# Patient Record
Sex: Male | Born: 1950
Health system: Southern US, Community
[De-identification: ages and names within clinical notes are randomized; demographics above are authoritative.]

## PROBLEM LIST (undated history)

## (undated) DIAGNOSIS — N189 Chronic kidney disease, unspecified: Secondary | ICD-10-CM

## (undated) DIAGNOSIS — N185 Chronic kidney disease, stage 5: Secondary | ICD-10-CM

## (undated) DIAGNOSIS — M549 Dorsalgia, unspecified: Secondary | ICD-10-CM

## (undated) DIAGNOSIS — J449 Chronic obstructive pulmonary disease, unspecified: Secondary | ICD-10-CM

## (undated) DIAGNOSIS — I739 Peripheral vascular disease, unspecified: Secondary | ICD-10-CM

## (undated) DIAGNOSIS — Z87442 Personal history of urinary calculi: Secondary | ICD-10-CM

## (undated) DIAGNOSIS — K219 Gastro-esophageal reflux disease without esophagitis: Secondary | ICD-10-CM

## (undated) DIAGNOSIS — E119 Type 2 diabetes mellitus without complications: Secondary | ICD-10-CM

## (undated) DIAGNOSIS — M1990 Unspecified osteoarthritis, unspecified site: Secondary | ICD-10-CM

## (undated) DIAGNOSIS — K5792 Diverticulitis of intestine, part unspecified, without perforation or abscess without bleeding: Secondary | ICD-10-CM

## (undated) DIAGNOSIS — R0600 Dyspnea, unspecified: Secondary | ICD-10-CM

## (undated) DIAGNOSIS — E611 Iron deficiency: Secondary | ICD-10-CM

## (undated) DIAGNOSIS — G8929 Other chronic pain: Secondary | ICD-10-CM

## (undated) DIAGNOSIS — N179 Acute kidney failure, unspecified: Secondary | ICD-10-CM

## (undated) DIAGNOSIS — Z9289 Personal history of other medical treatment: Secondary | ICD-10-CM

## (undated) HISTORY — PX: OTHER SURGICAL HISTORY: SHX169

## (undated) HISTORY — PX: KNEE ARTHROSCOPY: SUR90

## (undated) HISTORY — DX: Acute kidney failure, unspecified: N17.9

## (undated) HISTORY — DX: Chronic kidney disease, unspecified: N18.9

## (undated) HISTORY — DX: Chronic kidney disease, stage 5: N18.5

## (undated) HISTORY — PX: TONSILLECTOMY: SUR1361

## (undated) HISTORY — DX: Type 2 diabetes mellitus without complications: E11.9

---

## 2000-01-15 ENCOUNTER — Emergency Department (HOSPITAL_COMMUNITY): Admission: EM | Admit: 2000-01-15 | Discharge: 2000-01-15 | Payer: Self-pay | Admitting: Emergency Medicine

## 2003-03-14 ENCOUNTER — Ambulatory Visit (HOSPITAL_COMMUNITY): Admission: RE | Admit: 2003-03-14 | Discharge: 2003-03-14 | Payer: Self-pay | Admitting: Gastroenterology

## 2003-03-14 ENCOUNTER — Encounter (INDEPENDENT_AMBULATORY_CARE_PROVIDER_SITE_OTHER): Payer: Self-pay | Admitting: Specialist

## 2007-03-19 ENCOUNTER — Encounter: Admission: RE | Admit: 2007-03-19 | Discharge: 2007-03-19 | Payer: Self-pay | Admitting: Occupational Medicine

## 2007-07-02 ENCOUNTER — Encounter: Admission: RE | Admit: 2007-07-02 | Discharge: 2007-07-02 | Payer: Self-pay | Admitting: Occupational Medicine

## 2007-10-30 ENCOUNTER — Encounter: Admission: RE | Admit: 2007-10-30 | Discharge: 2007-10-30 | Payer: Self-pay | Admitting: Internal Medicine

## 2009-10-02 ENCOUNTER — Encounter: Admission: RE | Admit: 2009-10-02 | Discharge: 2009-10-02 | Payer: Self-pay | Admitting: Occupational Medicine

## 2011-03-15 ENCOUNTER — Other Ambulatory Visit: Payer: Self-pay | Admitting: Occupational Medicine

## 2011-03-15 ENCOUNTER — Ambulatory Visit: Payer: Self-pay

## 2011-03-15 DIAGNOSIS — R52 Pain, unspecified: Secondary | ICD-10-CM

## 2011-03-29 ENCOUNTER — Other Ambulatory Visit: Payer: Self-pay | Admitting: Occupational Medicine

## 2011-03-29 DIAGNOSIS — M25462 Effusion, left knee: Secondary | ICD-10-CM

## 2011-04-01 ENCOUNTER — Other Ambulatory Visit (HOSPITAL_COMMUNITY): Payer: Self-pay

## 2011-11-10 ENCOUNTER — Encounter (HOSPITAL_COMMUNITY): Payer: Self-pay

## 2011-11-21 ENCOUNTER — Other Ambulatory Visit (HOSPITAL_COMMUNITY): Payer: Self-pay | Admitting: Orthopedic Surgery

## 2011-11-22 ENCOUNTER — Encounter (HOSPITAL_COMMUNITY): Payer: Self-pay

## 2011-11-22 ENCOUNTER — Encounter (HOSPITAL_COMMUNITY)
Admission: RE | Admit: 2011-11-22 | Discharge: 2011-11-22 | Disposition: A | Discharge status: Home / Self Care | Payer: 59 | Source: Ambulatory Visit | Attending: Orthopedic Surgery | Admitting: Orthopedic Surgery

## 2011-11-22 ENCOUNTER — Other Ambulatory Visit: Payer: Self-pay

## 2011-11-22 HISTORY — DX: Unspecified osteoarthritis, unspecified site: M19.90

## 2011-11-22 HISTORY — DX: Gastro-esophageal reflux disease without esophagitis: K21.9

## 2011-11-22 LAB — CBC: RBC: 4.53 MIL/uL (ref 4.22–5.81)

## 2011-11-22 LAB — APTT: aPTT: 31 seconds (ref 24–37)

## 2011-11-22 LAB — COMPREHENSIVE METABOLIC PANEL
AST: 17 U/L (ref 0–37)
BUN: 16 mg/dL (ref 6–23)
CO2: 28 mEq/L (ref 19–32)
Chloride: 104 mEq/L (ref 96–112)
Creatinine, Ser: 0.82 mg/dL (ref 0.50–1.35)
GFR calc Af Amer: >90 mL/min (ref >90)
Glucose, Bld: 95 mg/dL (ref 70–99)
Potassium: 4.5 mEq/L (ref 3.5–5.1)
Total Bilirubin: 0.5 mg/dL (ref 0.3–1.2)
Total Protein: 7.5 g/dL (ref 6.0–8.3)

## 2011-11-22 LAB — SURGICAL PCR SCREEN
MRSA, PCR: NEGATIVE
Staphylococcus aureus: POSITIVE — AB

## 2011-11-22 LAB — TYPE AND SCREEN
ABO/RH(D): O POS
Antibody Screen: NEGATIVE

## 2011-11-22 NOTE — Pre-Procedure Instructions (Signed)
[place]  11/22/2011   Your procedure is scheduled on:11-25-2011  Report to [facility] at 8:10  AM.  Call this number if you have problems the morning of surgery: [phone]   Remember:   Do not eat food:After Midnight.  May have clear liquids: up to 4 Hours before arrival.  Clear liquids include soda, tea, black coffee, apple or grape juice, broth.Until 2:30AM  Take these medicines the morning of surgery with A SIP OF WATER Naproxen, omeprazole   Do not wear jewelry, make-up or nail polish.  Do not wear lotions, powders, or perfumes. You may wear deodorant.  Do not shave 48 hours prior to surgery.  Do not bring valuables to the hospital.  Contacts, dentures or bridgework may not be worn into surgery.  Leave suitcase in the car. After surgery it may be brought to your room.  For patients admitted to the hospital, checkout time is 11:00 AM the day of discharge.    Special Instructions: CHG Shower Use Special Wash: 1/2 bottle night before surgery and 1/2 bottle morning of surgery.   Please read over the following fact sheets that you were given: Pain Booklet, Blood Transfusion Information, MRSA Information and Surgical Site Infection Prevention

## 2011-11-22 NOTE — Progress Notes (Signed)
Anesthesia to review EKG, no previous EKG for comparison.

## 2011-11-22 NOTE — Consult Note (Addendum)
Anesthesia:  Patient is a 61 year old male scheduled for a left TKA on 11/25/11.  His history includes post-op N/V, GERD, OA, and smoking.  He has no know history of CAD or DM.   Labs acceptable.  CXR shows 1. Low lung volumes with minimal bibasilar atelectasis. 2. No other acute cardiopulmonary disease.   EKG shows NSR, possible inferior infarct with T wave abnormality, possible lateral infarct, minimal voltage criteria for LVH.  I reviewed the EKG with Anesthesiologist Dr. Marcie Bal, who, with the current information available, recommended clinical correlation on the day of surgery.  I have since contacted Taylor King and spoke with his wife.  (Taylor King works third shift at [employer] in [employer] and is sleeping so she is going to ask him to call me tomorrow.)  She reports that his PCP is Dr. Seward Carol.  He had a normal stress test approximately 5 years ago.  He has also had a prior EKG, but not for a few years.  (I have requested these records.)  He has not c/o any SOB or CP, but she has noticed that he will occasionally wheeze at night.  She also thinks he exhibits behavior of sleep apnea lasting only a few seconds, but he has never had a formal sleep study.  He has not been particularly active since his knee injury in May.  He did tolerated a left knee arthroscopy in June of this year.  Additional input pending records from Dr. Delfina Redwood and conversation with Taylor King.          Addendum:  11/23/11 1430  I have received records from Dr. Lina Sar office and have spoken with Taylor King.  He denies CP and SOB at rest.  He has not noticed any significant wheezing.  He does report DOE since he has become less active due to his knee pain.   He had a normal stress test on 11/30/04 showing normal perfusion without evidence of inducible ischemia, and EF 66%.  His last EKG done at [facility] was on 01/26/09, but there was lead reversal.  I updated Anesthesiologist Dr. Tamala Julian.  If no new CV symptoms then  plan to proceed.

## 2011-11-24 MED ORDER — CEFAZOLIN SODIUM-DEXTROSE 2-3 GM-% IV SOLR
2.0000 g | INTRAVENOUS | Status: AC
  Administered 2011-11-25: 2 g via INTRAVENOUS
  Filled 2011-11-24: qty 50

## 2011-11-25 ENCOUNTER — Encounter (HOSPITAL_COMMUNITY)
Admission: RE | Disposition: A | Discharge status: Home Health | Payer: Self-pay | Source: Ambulatory Visit | Attending: Orthopedic Surgery

## 2011-11-25 ENCOUNTER — Encounter (HOSPITAL_COMMUNITY): Payer: Self-pay | Admitting: Vascular Surgery

## 2011-11-25 ENCOUNTER — Ambulatory Visit (HOSPITAL_COMMUNITY): Payer: 59

## 2011-11-25 ENCOUNTER — Encounter (HOSPITAL_COMMUNITY): Payer: Self-pay | Admitting: Anesthesiology

## 2011-11-25 ENCOUNTER — Inpatient Hospital Stay (HOSPITAL_COMMUNITY)
Admission: RE | Admit: 2011-11-25 | Discharge: 2011-11-27 | DRG: 470 | Disposition: A | Discharge status: Home Health | Payer: 59 | Source: Ambulatory Visit | Attending: Orthopedic Surgery | Admitting: Orthopedic Surgery

## 2011-11-25 ENCOUNTER — Ambulatory Visit (HOSPITAL_COMMUNITY): Payer: 59 | Admitting: Vascular Surgery

## 2011-11-25 DIAGNOSIS — Z96659 Presence of unspecified artificial knee joint: Secondary | ICD-10-CM

## 2011-11-25 DIAGNOSIS — M1710 Unilateral primary osteoarthritis, unspecified knee: Principal | ICD-10-CM | POA: Diagnosis present

## 2011-11-25 DIAGNOSIS — M129 Arthropathy, unspecified: Secondary | ICD-10-CM | POA: Diagnosis present

## 2011-11-25 DIAGNOSIS — Z01810 Encounter for preprocedural cardiovascular examination: Secondary | ICD-10-CM

## 2011-11-25 DIAGNOSIS — K219 Gastro-esophageal reflux disease without esophagitis: Secondary | ICD-10-CM | POA: Diagnosis present

## 2011-11-25 DIAGNOSIS — F17200 Nicotine dependence, unspecified, uncomplicated: Secondary | ICD-10-CM | POA: Diagnosis present

## 2011-11-25 DIAGNOSIS — Z01812 Encounter for preprocedural laboratory examination: Secondary | ICD-10-CM

## 2011-11-25 HISTORY — PX: TOTAL KNEE ARTHROPLASTY: SHX125

## 2011-11-25 SURGERY — ARTHROPLASTY, KNEE, TOTAL
Anesthesia: General | Site: Knee | Laterality: Left | Wound class: Clean

## 2011-11-25 MED ORDER — WARFARIN VIDEO: Freq: Once | Status: DC

## 2011-11-25 MED ORDER — ACETAMINOPHEN 325 MG PO TABS: 650.0000 mg | ORAL_TABLET | Freq: Four times a day (QID) | ORAL | Status: DC | PRN

## 2011-11-25 MED ORDER — SODIUM CHLORIDE 0.9 % IV SOLN
INTRAVENOUS | Status: DC
  Administered 2011-11-26: 20 mL/h via INTRAVENOUS
  Administered 2011-11-27: 05:00:00 via INTRAVENOUS

## 2011-11-25 MED ORDER — WARFARIN SODIUM 7.5 MG PO TABS
7.5000 mg | ORAL_TABLET | Freq: Once | ORAL | Status: AC
  Administered 2011-11-25: 7.5 mg via ORAL
  Filled 2011-11-25: qty 1

## 2011-11-25 MED ORDER — BUPIVACAINE-EPINEPHRINE PF 0.5-1:200000 % IJ SOLN
INTRAMUSCULAR | Status: DC | PRN
  Administered 2011-11-25: 30 mL

## 2011-11-25 MED ORDER — METHOCARBAMOL 500 MG PO TABS
500.0000 mg | ORAL_TABLET | Freq: Four times a day (QID) | ORAL | Status: DC | PRN
  Administered 2011-11-26 – 2011-11-27 (×3): 500 mg via ORAL
  Filled 2011-11-25 (×4): qty 1

## 2011-11-25 MED ORDER — FENTANYL CITRATE 0.05 MG/ML IJ SOLN
INTRAMUSCULAR | Status: DC | PRN
  Administered 2011-11-25: 100 ug via INTRAVENOUS
  Administered 2011-11-25 (×5): 50 ug via INTRAVENOUS
  Administered 2011-11-25: 150 ug via INTRAVENOUS

## 2011-11-25 MED ORDER — MEPERIDINE HCL 25 MG/ML IJ SOLN: 6.2500 mg | INTRAMUSCULAR | Status: DC | PRN

## 2011-11-25 MED ORDER — ONDANSETRON HCL 4 MG/2ML IJ SOLN: 4.0000 mg | Freq: Four times a day (QID) | INTRAMUSCULAR | Status: DC | PRN

## 2011-11-25 MED ORDER — TEMAZEPAM 15 MG PO CAPS
15.0000 mg | ORAL_CAPSULE | Freq: Every evening | ORAL | Status: DC | PRN
  Administered 2011-11-26: 30 mg via ORAL
  Filled 2011-11-25: qty 2

## 2011-11-25 MED ORDER — METOCLOPRAMIDE HCL 5 MG/ML IJ SOLN
5.0000 mg | Freq: Three times a day (TID) | INTRAMUSCULAR | Status: DC | PRN
  Filled 2011-11-25: qty 2

## 2011-11-25 MED ORDER — EPHEDRINE SULFATE 50 MG/ML IJ SOLN
INTRAMUSCULAR | Status: DC | PRN
  Administered 2011-11-25 (×2): 10 mg via INTRAVENOUS
  Administered 2011-11-25: 5 mg via INTRAVENOUS

## 2011-11-25 MED ORDER — DIPHENHYDRAMINE HCL 12.5 MG/5ML PO ELIX
12.5000 mg | ORAL_SOLUTION | ORAL | Status: DC | PRN
  Filled 2011-11-25: qty 10

## 2011-11-25 MED ORDER — CEFAZOLIN SODIUM-DEXTROSE 2-3 GM-% IV SOLR
2.0000 g | Freq: Four times a day (QID) | INTRAVENOUS | Status: AC
  Administered 2011-11-25 – 2011-11-26 (×3): 2 g via INTRAVENOUS
  Filled 2011-11-25 (×3): qty 50

## 2011-11-25 MED ORDER — MIDAZOLAM HCL 5 MG/5ML IJ SOLN
INTRAMUSCULAR | Status: DC | PRN
  Administered 2011-11-25: 2 mg via INTRAVENOUS

## 2011-11-25 MED ORDER — ACETAMINOPHEN 10 MG/ML IV SOLN
INTRAVENOUS | Status: DC | PRN
  Administered 2011-11-25: 1000 mg via INTRAVENOUS

## 2011-11-25 MED ORDER — METOCLOPRAMIDE HCL 10 MG PO TABS: 5.0000 mg | ORAL_TABLET | Freq: Three times a day (TID) | ORAL | Status: DC | PRN

## 2011-11-25 MED ORDER — MAGNESIUM HYDROXIDE 400 MG/5ML PO SUSP: 30.0000 mL | Freq: Every day | ORAL | Status: DC | PRN

## 2011-11-25 MED ORDER — BISACODYL 5 MG PO TBEC: 5.0000 mg | DELAYED_RELEASE_TABLET | Freq: Every day | ORAL | Status: DC | PRN

## 2011-11-25 MED ORDER — OXYCODONE-ACETAMINOPHEN 5-325 MG PO TABS
1.0000 | ORAL_TABLET | ORAL | Status: DC | PRN
  Administered 2011-11-26 – 2011-11-27 (×4): 2 via ORAL
  Filled 2011-11-25 (×4): qty 2

## 2011-11-25 MED ORDER — COUMADIN BOOK
Freq: Once | Status: AC
  Administered 2011-11-25: 18:00:00
  Filled 2011-11-25 (×2): qty 1

## 2011-11-25 MED ORDER — LACTATED RINGERS IV SOLN
INTRAVENOUS | Status: DC | PRN
  Administered 2011-11-25 (×2): via INTRAVENOUS

## 2011-11-25 MED ORDER — PROPOFOL 10 MG/ML IV EMUL
INTRAVENOUS | Status: DC | PRN
  Administered 2011-11-25: 200 mg via INTRAVENOUS

## 2011-11-25 MED ORDER — ACETAMINOPHEN 650 MG RE SUPP: 650.0000 mg | Freq: Four times a day (QID) | RECTAL | Status: DC | PRN

## 2011-11-25 MED ORDER — PROMETHAZINE HCL 25 MG/ML IJ SOLN: 6.2500 mg | INTRAMUSCULAR | Status: DC | PRN

## 2011-11-25 MED ORDER — MORPHINE SULFATE 10 MG/ML IJ SOLN
INTRAMUSCULAR | Status: DC | PRN
  Administered 2011-11-25 (×2): 5 mg via INTRAVENOUS

## 2011-11-25 MED ORDER — MENTHOL 3 MG MT LOZG: 1.0000 | LOZENGE | OROMUCOSAL | Status: DC | PRN

## 2011-11-25 MED ORDER — PHENOL 1.4 % MT LIQD
1.0000 | OROMUCOSAL | Status: DC | PRN
  Filled 2011-11-25: qty 177

## 2011-11-25 MED ORDER — ACETAMINOPHEN 10 MG/ML IV SOLN
INTRAVENOUS | Status: AC
  Filled 2011-11-25: qty 100

## 2011-11-25 MED ORDER — ONDANSETRON HCL 4 MG PO TABS: 4.0000 mg | ORAL_TABLET | Freq: Four times a day (QID) | ORAL | Status: DC | PRN

## 2011-11-25 MED ORDER — DOCUSATE SODIUM 100 MG PO CAPS
100.0000 mg | ORAL_CAPSULE | Freq: Two times a day (BID) | ORAL | Status: DC
  Administered 2011-11-25 – 2011-11-27 (×4): 100 mg via ORAL
  Filled 2011-11-25 (×4): qty 1

## 2011-11-25 MED ORDER — SODIUM CHLORIDE 0.9 % IR SOLN
Status: DC | PRN
  Administered 2011-11-25: 3000 mL

## 2011-11-25 MED ORDER — HYDROMORPHONE HCL PF 1 MG/ML IJ SOLN
0.5000 mg | INTRAMUSCULAR | Status: DC | PRN
  Administered 2011-11-26 (×2): 1 mg via INTRAVENOUS
  Filled 2011-11-25 (×2): qty 1

## 2011-11-25 MED ORDER — DEXAMETHASONE SODIUM PHOSPHATE 4 MG/ML IJ SOLN
INTRAMUSCULAR | Status: DC | PRN
  Administered 2011-11-25: 4 mg via INTRAVENOUS

## 2011-11-25 MED ORDER — HYDROMORPHONE HCL PF 1 MG/ML IJ SOLN: 0.2500 mg | INTRAMUSCULAR | Status: DC | PRN

## 2011-11-25 MED ORDER — FERROUS SULFATE 325 (65 FE) MG PO TABS
325.0000 mg | ORAL_TABLET | Freq: Three times a day (TID) | ORAL | Status: DC
  Administered 2011-11-25 – 2011-11-27 (×5): 325 mg via ORAL
  Filled 2011-11-25 (×9): qty 1

## 2011-11-25 MED ORDER — DROPERIDOL 2.5 MG/ML IJ SOLN
INTRAMUSCULAR | Status: DC | PRN
  Administered 2011-11-25: 0.625 mg via INTRAVENOUS

## 2011-11-25 MED ORDER — HYDROCODONE-ACETAMINOPHEN 5-325 MG PO TABS
1.0000 | ORAL_TABLET | ORAL | Status: DC | PRN
  Administered 2011-11-26 (×3): 2 via ORAL
  Filled 2011-11-25 (×3): qty 2

## 2011-11-25 MED ORDER — ONDANSETRON HCL 4 MG/2ML IJ SOLN
INTRAMUSCULAR | Status: DC | PRN
  Administered 2011-11-25: 4 mg via INTRAVENOUS

## 2011-11-25 MED ORDER — METHOCARBAMOL 100 MG/ML IJ SOLN
500.0000 mg | Freq: Four times a day (QID) | INTRAMUSCULAR | Status: DC | PRN
  Filled 2011-11-25: qty 5

## 2011-11-25 SURGICAL SUPPLY — 51 items
BLADE KNIFE  20 PERSONNA (BLADE) ×2
BLADE KNIFE 20 PERSONNA (BLADE) ×2 IMPLANT
BLADE SAW SAG 90X13X1.27 (BLADE) ×2 IMPLANT
BLADE SAW SGTL 13.0X1.19X90.0M (BLADE) ×2 IMPLANT
BNDG COHESIVE 6X5 TAN STRL LF (GAUZE/BANDAGES/DRESSINGS) ×4 IMPLANT
BONE CEMENT PALACOSE (Orthopedic Implant) ×4 IMPLANT
BOWL SMART MIX CTS (DISPOSABLE) ×2 IMPLANT
CEMENT BONE PALACOSE (Orthopedic Implant) ×2 IMPLANT
CLOTH BEACON ORANGE TIMEOUT ST (SAFETY) ×2 IMPLANT
COVER BACK TABLE 24X17X13 BIG (DRAPES) IMPLANT
COVER SURGICAL LIGHT HANDLE (MISCELLANEOUS) ×4 IMPLANT
CUFF TOURNIQUET SINGLE 34IN LL (TOURNIQUET CUFF) ×2 IMPLANT
CUFF TOURNIQUET SINGLE 44IN (TOURNIQUET CUFF) IMPLANT
DRAPE EXTREMITY T 121X128X90 (DRAPE) ×2 IMPLANT
DRAPE PROXIMA HALF (DRAPES) ×2 IMPLANT
DRAPE U-SHAPE 47X51 STRL (DRAPES) ×2 IMPLANT
DRSG ADAPTIC 3X8 NADH LF (GAUZE/BANDAGES/DRESSINGS) ×2 IMPLANT
DRSG PAD ABDOMINAL 8X10 ST (GAUZE/BANDAGES/DRESSINGS) ×2 IMPLANT
DURAPREP 26ML APPLICATOR (WOUND CARE) ×2 IMPLANT
ELECT REM PT RETURN 9FT ADLT (ELECTROSURGICAL) ×2
ELECTRODE REM PT RTRN 9FT ADLT (ELECTROSURGICAL) ×1 IMPLANT
FACESHIELD LNG OPTICON STERILE (SAFETY) ×4 IMPLANT
GLOVE BIOGEL PI IND STRL 9 (GLOVE) ×1 IMPLANT
GLOVE BIOGEL PI INDICATOR 9 (GLOVE) ×1
GLOVE SURG ORTHO 9.0 STRL STRW (GLOVE) ×4 IMPLANT
GOWN PREVENTION PLUS XLARGE (GOWN DISPOSABLE) ×2 IMPLANT
GOWN SRG XL XLNG 56XLVL 4 (GOWN DISPOSABLE) ×2 IMPLANT
GOWN STRL NON-REIN XL XLG LVL4 (GOWN DISPOSABLE) ×2
HANDPIECE INTERPULSE COAX TIP (DISPOSABLE) ×1
KIT BASIN OR (CUSTOM PROCEDURE TRAY) ×2 IMPLANT
KIT ROOM TURNOVER OR (KITS) ×2 IMPLANT
MANIFOLD NEPTUNE II (INSTRUMENTS) ×2 IMPLANT
NEEDLE SPNL 18GX3.5 QUINCKE PK (NEEDLE) IMPLANT
NS IRRIG 1000ML POUR BTL (IV SOLUTION) ×2 IMPLANT
PACK TOTAL JOINT (CUSTOM PROCEDURE TRAY) ×2 IMPLANT
PAD ARMBOARD 7.5X6 YLW CONV (MISCELLANEOUS) ×4 IMPLANT
PADDING CAST COTTON 6X4 STRL (CAST SUPPLIES) ×2 IMPLANT
SET HNDPC FAN SPRY TIP SCT (DISPOSABLE) ×1 IMPLANT
SPONGE GAUZE 4X4 12PLY (GAUZE/BANDAGES/DRESSINGS) ×2 IMPLANT
STAPLER VISISTAT 35W (STAPLE) ×2 IMPLANT
SUCTION FRAZIER TIP 10 FR DISP (SUCTIONS) ×2 IMPLANT
SUT VIC AB 0 CTB1 27 (SUTURE) ×4 IMPLANT
SUT VIC AB 1 CTX 36 (SUTURE) ×1
SUT VIC AB 1 CTX36XBRD ANBCTR (SUTURE) ×1 IMPLANT
SUT VIC AB 2-0 CTB1 (SUTURE) ×4 IMPLANT
SYR 50ML SLIP (SYRINGE) IMPLANT
TOWEL OR 17X24 6PK STRL BLUE (TOWEL DISPOSABLE) ×2 IMPLANT
TOWEL OR 17X26 10 PK STRL BLUE (TOWEL DISPOSABLE) ×2 IMPLANT
TRAY FOLEY CATH 14FR (SET/KITS/TRAYS/PACK) ×2 IMPLANT
WATER STERILE IRR 1000ML POUR (IV SOLUTION) ×6 IMPLANT
WRAP KNEE MAXI GEL POST OP (GAUZE/BANDAGES/DRESSINGS) ×2 IMPLANT

## 2011-11-25 NOTE — Anesthesia Procedure Notes (Addendum)
Anesthesia Regional Block:  Femoral nerve block  Pre-Anesthetic Checklist: ,, timeout performed, Correct Patient, Correct Site, Correct Laterality, Correct Procedure, Correct Position, site marked, Risks and benefits discussed, pre-op evaluation,  At surgeon's request and post-op pain management  Laterality: Left  Prep: Maximum Sterile Barrier Precautions used and chloraprep       Needles:  Injection technique: Single-shot  Needle Type: Other   (Arrow 49mm)    Needle Gauge: 22 and 22 G    Additional Needles:  Procedures: nerve stimulator Femoral nerve block  Nerve Stimulator or Paresthesia:  Response: Patellar respose, 0.4 mA,   Additional Responses:   Narrative:  Start time: 11/25/2011 11:05 AM End time: 11/25/2011 11:16 AM Injection made incrementally with aspirations every 5 mL. Anesthesiologist: Fitzgerald,MD  Additional Notes: 2% Lidocaine skin wheel.   Femoral nerve block Procedure Name: LMA Insertion Date/Time: 11/25/2011 11:43 AM Performed by: Luane School A. Patient Re-evaluated:Patient Re-evaluated prior to inductionOxygen Delivery Method: Circle System Utilized Preoxygenation: Pre-oxygenation with 100% oxygen Intubation Type: IV induction LMA: LMA inserted LMA Size: 5.0 Tube type: Oral Number of attempts: 1 Placement Confirmation: positive ETCO2 and breath sounds checked- equal and bilateral Tube secured with: Tape Dental Injury: Teeth and Oropharynx as per pre-operative assessment

## 2011-11-25 NOTE — Anesthesia Preprocedure Evaluation (Addendum)
Anesthesia Evaluation  Patient identified by MRN, date of birth, ID band Patient awake    Reviewed: Allergy & Precautions, H&P , NPO status , Patient's Chart, lab work & pertinent test results  History of Anesthesia Complications (+) PONV  Airway Mallampati: II TM Distance: >3 FB Neck ROM: Full    Dental No notable dental hx. (+) Edentulous Upper, Edentulous Lower and Dental Advisory Given   Pulmonary neg pulmonary ROS, Current Smoker,  clear to auscultation  Pulmonary exam normal       Cardiovascular Exercise Tolerance: Good neg cardio ROS Regular Normal    Neuro/Psych Negative Neurological ROS  Negative Psych ROS   GI/Hepatic negative GI ROS, Neg liver ROS, GERD-  Medicated and Controlled,  Endo/Other  Negative Endocrine ROS  Renal/GU negative Renal ROS  Genitourinary negative   Musculoskeletal   Abdominal   Peds  Hematology negative hematology ROS (+)   Anesthesia Other Findings   Reproductive/Obstetrics negative OB ROS                          Anesthesia Physical Anesthesia Plan  ASA: II  Anesthesia Plan: General   Post-op Pain Management:    Induction: Intravenous  Airway Management Planned: LMA  Additional Equipment:   Intra-op Plan:   Post-operative Plan: Extubation in OR  Informed Consent: I have reviewed the patients History and Physical, chart, labs and discussed the procedure including the risks, benefits and alternatives for the proposed anesthesia with the patient or authorized representative who has indicated his/her understanding and acceptance.     Plan Discussed with: CRNA  Anesthesia Plan Comments:         Anesthesia Quick Evaluation

## 2011-11-25 NOTE — Progress Notes (Signed)
ANTICOAGULATION CONSULT NOTE - Initial Consult  Pharmacy Consult for Coumadin Indication: VTE prophylaxis  Allergies  Allergen Reactions  . Other Other (See Comments)    Anesthesia medication (pt unsure of which med)- reaction upset stomach.    Patient Measurements: Weight: 171 lb (77.565 kg) (wt 171 lbs)   Vital Signs: Temp: 98.1 F (36.7 C) (01/11 1615) Temp src: Oral (01/11 1022) BP: 117/74 mmHg (01/11 1627) Pulse Rate: 80  (01/11 1627)  Labs: No results found for this basename: HGB:2,HCT:3,PLT:3,APTT:3,LABPROT:3,INR:3,HEPARINUNFRC:3,CREATININE:3,CKTOTAL:3,CKMB:3,TROPONINI:3 in the last 72 hours CrCl is unknown because there is no height on file for the current visit.  Medical History: Past Medical History  Diagnosis Date  . PONV (postoperative nausea and vomiting)   . GERD (gastroesophageal reflux disease)   . Arthritis     Medications:  Prescriptions prior to admission  Medication Sig Dispense Refill  . naproxen sodium (ANAPROX) 220 MG tablet Take 440 mg by mouth daily.        . Omeprazole Magnesium 20.6 (20 BASE) MG CPDR Take 1 capsule by mouth daily.          Assessment: Patient is s/p L TKA, will start Coumadin for VTE prophylaxis. No anticoagulants noted PTA, noted LFTs, albumin, PT/INR and CBC normal at baseline.  Goal of Therapy:  INR 2-3   Plan:  1. Coumadin 7.5mg  PO x1 today 2. Coumadin book and video to initiate education, will f/up to verify understanding in person POD >1 3. Daily PT/INR 4. Consider Lovenox 30mg  SQ q 12 until INR>2  Annika Selke K. Posey Pronto, PharmD, BCPS.  Clinical Pharmacist Pager [phone]. 11/25/2011 5:19 PM

## 2011-11-25 NOTE — Op Note (Signed)
OPERATIVE REPORT  DATE OF SURGERY: 11/25/2011  PATIENT:  Taylor King,  61 y.o. male  PRE-OPERATIVE DIAGNOSIS:  osteoarthritis Left Knee  POST-OPERATIVE DIAGNOSIS:  osteoarthritis left knee  PROCEDURE:  Procedure(s): TOTAL KNEE ARTHROPLASTY Zimmer components. Size 4 tibia. Size E femur. 10 mm polyethylene. 32 mm patellar  SURGEON:  Surgeon(s): Newt Minion, MD  ANESTHESIA:   regional and general  EBL:  min ML  SPECIMEN:  No Specimen  TOURNIQUET:   Total Tourniquet Time Documented: Thigh (Left) - 38 minutes  PROCEDURE DETAILS: Patient is a 61 year old gentleman with osteoarthritis of his left knee. He has failed conservative care has pain with activities of daily living. He is undergone injections anti-inflammatories activity modifications and assistive devices all without relief in his symptoms and presents at this time for total knee arthroplasty. Risks and benefits were discussed including infection neurovascular injury persistent pain DVT pulmonary embolus need for additional surgery. Patient was brought to or room tendon underwent a general anesthetic after femoral block. After adequate levels and anesthesia obtained patient's left lower extremity was prepped using DuraPrep draped into a sterile field Ioban was used to cover all exposed skin. A midline incision was made carried down to medial parapatellar retinacular incision the IM guide was used for the femur and 10 mm was taken off the distal femur. Attention was focused on the tibia and 10 mm was taken off the tibia with a 7 posterior slope neutral varus and valgus the size for a size for and the keel punch was made for the size 4 tibia. Attention was then focused in the femur it sized for a size E and the box cut and chamfer cuts were made for the femur. The patella was resurfaced and 10 mm was taken off the patella and lug cuts were made for the 32 mm patella. The trial components were placed the knee was placed a full  range of motion the knee was stable. The trial components were removed the knee was placed a full range of motion the knee was irrigated with pulsatile lavage and all loose bone the meniscus was removed. Cement was mixed in the tibial and femoral components were cemented in place with the patella tracked placed within the left in extension the patella was clamped and left in extension with the clamp in place until the cement hardened the knee was irrigated with pulsatile lavage. The tourniquet was deflated hemostasis was obtained the knee was placed through full range of motion and was stable he had full extension. The retinaculum was closed using #1 Vicryl subcutaneous is closed using 0 Vicryl the skin was closed using approximate staples the wound is covered with Adaptic orthopedic sponges AB dressing web roll and Coban. Patient was asked to the PACU in stable condition  PLAN OF CARE: Admit to inpatient   PATIENT DISPOSITION:  PACU - hemodynamically stable.   Newt Minion, MD 11/25/2011 1:07 PM

## 2011-11-25 NOTE — Transfer of Care (Signed)
Immediate Anesthesia Transfer of Care Note  Patient: Taylor King  Procedure(s) Performed:  TOTAL KNEE ARTHROPLASTY - Left Total Knee Arthroplasty  Patient Location: PACU  Anesthesia Type: GA combined with regional for post-op pain  Level of Consciousness: awake, alert , oriented and patient cooperative  Airway & Oxygen Therapy: Patient Spontanous Breathing and Patient connected to nasal cannula oxygen  Post-op Assessment: Report given to PACU RN, Post -op Vital signs reviewed and stable and Patient moving all extremities X 4  Post vital signs: Reviewed and stable Filed Vitals:   11/25/11 1325  BP:   Pulse:   Temp: 36.5 C  Resp:     Complications: No apparent anesthesia complications

## 2011-11-25 NOTE — Anesthesia Postprocedure Evaluation (Signed)
  Anesthesia Post-op Note  Patient: Taylor King  Procedure(s) Performed:  TOTAL KNEE ARTHROPLASTY - Left Total Knee Arthroplasty  Patient Location: PACU  Anesthesia Type: GA combined with regional for post-op pain  Level of Consciousness: awake  Airway and Oxygen Therapy: Patient Spontanous Breathing and Patient connected to nasal cannula oxygen  Post-op Pain: mild  Post-op Assessment: Post-op Vital signs reviewed, Patient's Cardiovascular Status Stable, Respiratory Function Stable and Patent Airway  Post-op Vital Signs: Reviewed and stable  Complications: No apparent anesthesia complications

## 2011-11-25 NOTE — H&P (Signed)
Taylor King is an 61 y.o. male.   Chief Complaint: pain left knee  HPI: Patient is a 61 year old gentleman with chronic osteoarthritis pain of his left knee. He has failed conservative treatment including activity modifications and assistive devices anti-inflammatories injections all without relief.  Past Medical History  Diagnosis Date  . PONV (postoperative nausea and vomiting)   . GERD (gastroesophageal reflux disease)   . Arthritis     Past Surgical History  Procedure Date  . Knee arthroscopy [id number]    left knew  . Tonsillectomy   . Appendectomy     No family history on file. Social History:  reports that he has quit smoking. His smoking use included Cigarettes. He does not have any smokeless tobacco history on file. He reports that he drinks alcohol. He reports that he does not use illicit drugs.  Allergies:  Allergies  Allergen Reactions  . Other Other (See Comments)    Anesthesia medication (pt unsure of which med)- reaction upset stomach.    Medications Prior to Admission  Medication Dose Route Frequency Provider Last Rate Last Dose  . ceFAZolin (ANCEF) IVPB 2 g/50 mL premix  2 g Intravenous 60 min Pre-Op Nadine Counts, PHARMD       No current outpatient prescriptions on file as of 11/25/2011.    No results found for this or any previous visit (from the past 48 hour(s)). No results found.  Review of Systems  All other systems reviewed and are negative.    Blood pressure 138/89, pulse 81, temperature 98 F (36.7 C), temperature source Oral, resp. rate 18, SpO2 96.00%. Physical Exam  On examination patient has range of motion of his left knee from 10-100. There is crepitation with range of motion tenderness to palpation in the medial and lateral joint line as well as the patellofemoral joint. Assessment/Plan Assessment: Osteoarthritis left knee. Plan: Left total knee arthroplasty risks and benefits of surgery were discussed including infection  neurovascular injury persistent pain DVT pulmonary embolus need for additional surgery patient states he understands and wishes to proceed at this time  Taylor King V 11/25/2011, 10:28 AM

## 2011-11-25 NOTE — Preoperative (Signed)
Beta Blockers   Reason not to administer Beta Blockers:Not Applicable 

## 2011-11-26 LAB — CBC
HCT: 34.5 % — ABNORMAL LOW (ref 39.0–52.0)
Hemoglobin: 11.4 g/dL — ABNORMAL LOW (ref 13.0–17.0)
MCHC: 33 g/dL (ref 30.0–36.0)
WBC: 15.5 10*3/uL — ABNORMAL HIGH (ref 4.0–10.5)

## 2011-11-26 LAB — PROTIME-INR: Prothrombin Time: 14.4 seconds (ref 11.6–15.2)

## 2011-11-26 LAB — BASIC METABOLIC PANEL
BUN: 13 mg/dL (ref 6–23)
Chloride: 104 mEq/L (ref 96–112)
GFR calc Af Amer: >90 mL/min (ref >90)
Potassium: 4.3 mEq/L (ref 3.5–5.1)

## 2011-11-26 MED ORDER — WARFARIN SODIUM 7.5 MG PO TABS
7.5000 mg | ORAL_TABLET | Freq: Once | ORAL | Status: AC
  Administered 2011-11-26: 7.5 mg via ORAL
  Filled 2011-11-26: qty 1

## 2011-11-26 NOTE — Progress Notes (Signed)
Physical Therapy Evaluation Patient Details Name: Taylor King MRN: [id number] DOB: 12-18-1950 Today's Date: 11/26/2011  Problem List: There is no problem list on file for this patient.   Past Medical History:  Past Medical History  Diagnosis Date  . PONV (postoperative nausea and vomiting)   . GERD (gastroesophageal reflux disease)   . Arthritis    Past Surgical History:  Past Surgical History  Procedure Date  . Knee arthroscopy [id number]    left knew  . Tonsillectomy   . Appendectomy     PT Assessment/Plan/Recommendation PT Assessment Clinical Impression Statement: Pt presents with a medical diagnosis of Left TKA along with the following impairments/deficits and therapy diagnosis listed below. Pt will benefit from skilled PT in the acute care setting in order to maximize functional mobility in the acute care setting  PT Recommendation/Assessment: Patient will need skilled PT in the acute care venue PT Problem List: Decreased strength;Decreased range of motion;Decreased activity tolerance;Decreased balance;Decreased knowledge of precautions;Decreased knowledge of use of DME;Pain PT Therapy Diagnosis : Acute pain;Abnormality of gait PT Plan PT Frequency: 7X/week PT Treatment/Interventions: DME instruction;Gait training;Stair training;Functional mobility training;Therapeutic activities;Therapeutic exercise;Patient/family education PT Recommendation Follow Up Recommendations: Home health PT;Supervision - Intermittent Equipment Recommended: Rolling walker with 5" wheels PT Goals  Acute Rehab PT Goals PT Goal Formulation: With patient Time For Goal Achievement: 7 days Pt will go Supine/Side to Sit: with modified independence PT Goal: Supine/Side to Sit - Progress: Progressing toward goal Pt will go Sit to Supine/Side: with modified independence PT Goal: Sit to Supine/Side - Progress: Progressing toward goal Pt will go Sit to Stand: with modified independence PT Goal: Sit to  Stand - Progress: Progressing toward goal Pt will go Stand to Sit: with modified independence PT Goal: Stand to Sit - Progress: Progressing toward goal Pt will Transfer Bed to Chair/Chair to Bed: with modified independence PT Transfer Goal: Bed to Chair/Chair to Bed - Progress: Progressing toward goal Pt will Ambulate: >150 feet;with modified independence;with least restrictive assistive device PT Goal: Ambulate - Progress: Progressing toward goal Pt will Go Up / Down Stairs: 3-5 stairs;with rail(s);with supervision PT Goal: Up/Down Stairs - Progress: Progressing toward goal Pt will Perform Home Exercise Program: Independently PT Goal: Perform Home Exercise Program - Progress: Progressing toward goal  PT Evaluation Precautions/Restrictions  Restrictions Weight Bearing Restrictions: Yes LLE Weight Bearing: Weight bearing as tolerated Prior Functioning  Home Living Lives With: Spouse Receives Help From: Family Type of Home: House Home Layout: One level Home Access: Stairs to enter Entrance Stairs-Rails: Can reach both Entrance Stairs-Number of Steps: 4 Bathroom Shower/Tub: Gaffer;Door [employer] Toilet: Standard Bathroom Accessibility: Yes How Accessible: Accessible via walker Home Adaptive Equipment: Crutches Prior Function Level of Independence: Independent with basic ADLs;Independent with gait;Independent with transfers Able to Take Stairs?: Yes Driving: Yes Vocation: Full time employment Cognition Cognition Arousal/Alertness: Awake/alert Overall Cognitive Status: Appears within functional limits for tasks assessed Orientation Level: Oriented X4 Sensation/Coordination Sensation Light Touch: Impaired Detail Light Touch Impaired Details: Impaired LLE (Calf decreased sensation) Extremity Assessment RLE Assessment RLE Assessment: Exceptions to [facility] RLE AROM (degrees) Overall AROM Right Lower Extremity: Deficits;Due to pain (Hip and Ankle WFL; Knee 0-70 degrees ) RLE  Strength RLE Overall Strength: Deficits;Due to pain (Hip and Ankle WFL; Pt able to complete SLR independently) LLE Assessment LLE Assessment: Within Functional Limits Mobility (including Balance) Bed Mobility Bed Mobility: Yes Supine to Sit: 5: Supervision Supine to Sit Details (indicate cue type and reason): VC for proper sequencing. Pt used  RLE to assist with LLE out of bed Sitting - Scoot to Edge of Bed: 6: Modified independent (Device/Increase time) Transfers Transfers: Yes Sit to Stand: 5: Supervision;With upper extremity assist;From bed Sit to Stand Details (indicate cue type and reason): VC for hand placement and safety to RW Stand to Sit: 5: Supervision;With upper extremity assist;To chair/3-in-1 Stand to Sit Details: VC for hand placement Ambulation/Gait Ambulation/Gait: Yes Ambulation/Gait Assistance: 4: Min assist Ambulation/Gait Assistance Details (indicate cue type and reason): VC for proper gait sequencing as well as heel toe pattern throughout ambulation. Cues needed for safety with RW distance for support (Will assess crutches vs RW next session) Ambulation Distance (Feet): 200 Feet Assistive device: Rolling walker Gait Pattern: Within Functional Limits;Right flexed knee in stance;Decreased weight shift to right;Decreased stance time - right;Decreased step length - left;Step-to pattern;Trunk flexed Gait velocity: Decreased gait speed Stairs: No    Exercise  Total Joint Exercises Ankle Circles/Pumps: AROM;Strengthening;Both;10 reps;Supine Quad Sets: AROM;Strengthening;Right;10 reps;Supine Heel Slides: AROM;Strengthening;Right;10 reps;Seated End of Session PT - End of Session Equipment Utilized During Treatment: Gait belt Activity Tolerance: Patient tolerated treatment well Patient left: in chair;with call bell in reach Nurse Communication: Mobility status for transfers;Mobility status for ambulation General Behavior During Session: [facility] for tasks  performed Cognition: [facility] for tasks performed  Ambrose Finland 11/26/2011, 12:01 PM  11/26/2011 Ambrose Finland DPT PAGER: [phone] OFFICE: [phone]

## 2011-11-26 NOTE — Progress Notes (Signed)
Subjective: 1 Day Post-Op Procedure(s) (LRB): TOTAL KNEE ARTHROPLASTY (Left) No complaints this AM    Objective: Vital signs in last 24 hours: Temp:  [97.7 F (36.5 C)-98.1 F (36.7 C)] 98.1 F (36.7 C) (01/11 2146) Pulse Rate:  [76-104] 104  (01/11 2146) Resp:  [10-19] 18  (01/11 2146) BP: (117-138)/(68-91) 119/74 mmHg (01/11 2146) SpO2:  [94 %-100 %] 94 % (01/11 2146) Weight:  [77.565 kg (171 lb)] 77.565 kg (171 lb) (01/11 1039)  Intake/Output from previous day: 01/11 0701 - 01/12 0700 In: 2580 [P.O.:120; I.V.:2360; IV Piggyback:100] Out: 64 [Urine:800; Blood:250] Intake/Output this shift: Total I/O In: 760 [I.V.:660; IV Piggyback:100] Out: 400 [Urine:400]  No results found for this basename: HGB:5 in the last 72 hours No results found for this basename: WBC:2,RBC:2,HCT:2,PLT:2 in the last 72 hours No results found for this basename: NA:2,K:2,CL:2,CO2:2,BUN:2,CREATININE:2,GLUCOSE:2,CALCIUM:2 in the last 72 hours No results found for this basename: LABPT:2,INR:2 in the last 72 hours  Neurologically intact  Assessment/Plan: 1 Day Post-Op Procedure(s) (LRB): TOTAL KNEE ARTHROPLASTY (Left) Up with therapy Work on knee extension  Azan Maneri V 11/26/2011, 6:17 AM

## 2011-11-26 NOTE — Progress Notes (Signed)
   CARE MANAGEMENT NOTE 11/26/2011  Patient:  EUGENE, SHADOWENS   Account Number:  [id number]  Date Initiated:  11/26/2011  Documentation initiated by:  Llana Aliment  Subjective/Objective Assessment:   DX:  Pain in left knee. Admitted to have same day Left Knee Arthroplasty     Action/Plan:   discharge planning   Anticipated DC Date:  11/21/2011   Anticipated DC Plan:  [place]  CM consult      Choice offered to / List presented to:  [name]           Status of service:  In process, will continue to follow Medicare Important Message given?  NO (If response is "NO", the following Medicare IM given date fields will be blank) Date Medicare IM given:   Date Additional Medicare IM given:    Discharge Disposition:    Per UR Regulation:    Comments:  11/25/10 1715--Spoke with pt. concerning [facility] services as well as giving him a list of agencies.  Pt. wanted to look over list before deciding.  Explained to pt. that when he made decision to let his nurse know and a nurse case manager would set it up for him.  NCM to follow. Llana Aliment, RN, BSN Nurse Case Manager

## 2011-11-26 NOTE — Progress Notes (Signed)
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 INR below goal. No bleeding noted.  Goal of Therapy:  INR 2-3   Plan:  1. Repeat Coumadin 7.5 mg po tonight 2. INR daily   Allergies  Allergen Reactions  . Other Other (See Comments)    Anesthesia medication (pt unsure of which med)- reaction upset stomach.    Patient Measurements: Weight: 171 lb (77.565 kg) (wt 171 lbs)  Vital Signs: Temp: 98.3 F (36.8 C) (01/12 0640) BP: 120/68 mmHg (01/12 0640) Pulse Rate: 87  (01/12 0640)  Labs:  Basename 11/26/11 0940 11/26/11 0630  HGB 11.4* --  HCT 34.5* --  PLT 271 --  APTT -- --  LABPROT -- 14.4  INR -- 1.10  HEPARINUNFRC -- --  CREATININE 0.83 --  CKTOTAL -- --  CKMB -- --  TROPONINI -- --   CrCl is unknown because there is no height on file for the current visit.   Medications:  Scheduled:    . ceFAZolin (ANCEF) IV  2 g Intravenous Q6H  . coumadin book   Does not apply Once  . docusate sodium  100 mg Oral BID  . ferrous sulfate  325 mg Oral TID PC  . warfarin  7.5 mg Oral ONCE-1800  . warfarin  7.5 mg Oral ONCE-1800  . warfarin   Does not apply Once    [facility] 11/26/2011,12:41 PM

## 2011-11-27 LAB — CBC
HCT: 31.2 % — ABNORMAL LOW (ref 39.0–52.0)
Hemoglobin: 10.1 g/dL — ABNORMAL LOW (ref 13.0–17.0)
WBC: 13.4 10*3/uL — ABNORMAL HIGH (ref 4.0–10.5)

## 2011-11-27 LAB — PROTIME-INR
INR: 1.25 (ref 0.00–1.49)
Prothrombin Time: 16 s — ABNORMAL HIGH (ref 11.6–15.2)

## 2011-11-27 MED ORDER — OXYCODONE-ACETAMINOPHEN 5-325 MG PO TABS: 1.0000 | ORAL_TABLET | ORAL | Status: AC | PRN

## 2011-11-27 MED ORDER — WARFARIN SODIUM 1 MG PO TABS: 1.0000 mg | ORAL_TABLET | Freq: Every day | ORAL | Status: DC

## 2011-11-27 MED ORDER — METHOCARBAMOL 500 MG PO TABS: 500.0000 mg | ORAL_TABLET | Freq: Three times a day (TID) | ORAL | Status: AC

## 2011-11-27 NOTE — Progress Notes (Signed)
   CARE MANAGEMENT NOTE 11/27/2011  Patient:  Taylor King, Taylor King   Account Number:  [id number]  Date Initiated:  11/26/2011  Documentation initiated by:  Llana Aliment  Subjective/Objective Assessment:   DX:  Pain in left knee. Admitted to have same day Left Knee Arthroplasty     Action/Plan:   discharge planning   Anticipated DC Date:  11/21/2011   Anticipated DC Plan:  [place]  CM consult      [facility] Choice  HOME HEALTH   Choice offered to / List presented to:  [name]        [place] arranged  [place].   Status of service:  Completed, signed off Medicare Important Message given?  NO (If response is "NO", the following Medicare IM given date fields will be blank) Date Medicare IM given:   Date Additional Medicare IM given:    Discharge Disposition:  [place]  Per UR Regulation:    Comments:  11/27/2011 1200 Contacted pt and decided with [facility]. Contacted [facility] for [facility] services and RW for scheduled d/c today. Jonnie Finner RN CCM Case Mgmt phone [phone]  11/25/10 1715--Spoke with pt. concerning [place] services as well as giving him a list of agencies.  Pt. wanted to look over list before deciding.  Explained to pt. that when he made decision to let his nurse know and a nurse case manager would set it up for him.  NCM to follow. Llana Aliment, RN, BSN Nurse Case Manager

## 2011-11-27 NOTE — Discharge Summary (Signed)
Physician Discharge Summary  Patient ID: Taylor King MRN: [id number] DOB/AGE: August 08, 1951 61 y.o.  Admit date: 11/25/2011 Discharge date: 11/27/2011  Admission Diagnoses: Osteoarthritis left knee  Discharge Diagnoses: Osteoarthritis left knee Active Problems:  * No active hospital problems. *    Discharged Condition: stable  Hospital Course: Patient's hospital course was essentially unremarkable he underwent total knee arthroplasty on 11/25/2011. Postoperatively he progressed well with physical therapy and was discharged to home in stable condition.  Consults: none  Significant Diagnostic Studies: labs: Routine labs were obtained.  Treatments: surgery: Total knee arthroplasty with Zimmer components  Discharge Exam: Blood pressure 127/73, pulse 81, temperature 98.2 F (36.8 C), temperature source Oral, resp. rate 16, weight 77.565 kg (171 lb), SpO2 91.00%. Incision/Wound: incision clean and dry at time of discharge.  Disposition: Final discharge disposition not confirmed  Discharge Orders    Future Orders Please Complete By Expires   Diet - low sodium heart healthy      Call MD / Call 911      Comments:   If you experience chest pain or shortness of breath, CALL 911 and be transported to the hospital emergency room.  If you develope a fever above 101 F, pus (white drainage) or increased drainage or redness at the wound, or calf pain, call your surgeon's office.   Constipation Prevention      Comments:   Drink plenty of fluids.  Prune juice may be helpful.  You may use a stool softener, such as Colace (over the counter) 100 mg twice a day.  Use MiraLax (over the counter) for constipation as needed.   Increase activity slowly as tolerated      Weight Bearing as taught in Physical Therapy      Comments:   Use a walker or crutches as instructed.   Change dressing      Scheduling Instructions:   Removed current dressing and apply a Mepilex dressing prior to discharge.      Medication List  As of 11/27/2011  8:23 AM   TAKE these medications         methocarbamol 500 MG tablet   Commonly known as: ROBAXIN   Take 1 tablet (500 mg total) by mouth 3 (three) times daily.      naproxen sodium 220 MG tablet   Commonly known as: ANAPROX   Take 440 mg by mouth daily.      Omeprazole Magnesium 20.6 (20 BASE) MG Cpdr   Take 1 capsule by mouth daily.      oxyCODONE-acetaminophen 5-325 MG per tablet   Commonly known as: PERCOCET   Take 1 tablet by mouth every 4 (four) hours as needed for pain.      warfarin 1 MG tablet   Commonly known as: COUMADIN   Take 1 tablet (1 mg total) by mouth daily.           Follow-up Information    Follow up with Newt Minion, MD.   Contact information:   [place] [phone]          Signed: Newt Minion 11/27/2011, 8:23 AM

## 2011-11-27 NOTE — Progress Notes (Signed)
Occupational Therapy Evaluation and D/C Patient Details Name: Taylor King MRN: [id number] DOB: [birth year] Today's Date: 11/27/2011  Problem List: There is no problem list on file for this patient.   Past Medical History:  Past Medical History  Diagnosis Date  . PONV (postoperative nausea and vomiting)   . GERD (gastroesophageal reflux disease)   . Arthritis    Past Surgical History:  Past Surgical History  Procedure Date  . Knee arthroscopy [phone]    left knew  . Tonsillectomy   . Appendectomy     OT Assessment/Plan/Recommendation OT Assessment Clinical Impression Statement: All education completed and pt to have necessary level of assist upon d/c. Pt declining any DME at this time (e.g. 3n1, shower seat). No further acute OT needs at this time as pt is to d/c this pm. Signing off. OT Recommendation/Assessment: Patient does not need any further OT services OT Recommendation Follow Up Recommendations: No OT follow up Equipment Recommended: None recommended by OT OT Goals    OT Evaluation Precautions/Restrictions  Restrictions Weight Bearing Restrictions: Yes LLE Weight Bearing: Weight bearing as tolerated Prior Twin Lives With: Spouse Receives Help From: Family Type of Home: House Home Layout: One level Home Access: Stairs to enter Entrance Stairs-Rails: Can reach both Entrance Stairs-Number of Steps: 4 Bathroom Shower/Tub: Gaffer;Door [employer] Toilet: Standard Bathroom Accessibility: Yes How Accessible: Accessible via walker Home Adaptive Equipment: Crutches Additional Comments: Patient is declining DME at this time. Prior Function Level of Independence: Independent with basic ADLs;Independent with gait;Independent with transfers Able to Take Stairs?: Yes Driving: Yes Vocation: Full time employment ADL ADL Eating/Feeding: Simulated;Independent Where Assessed - Eating/Feeding: Edge of bed Grooming:  Simulated;Supervision/safety Where Assessed - Grooming: Standing at sink Upper Body Bathing: Simulated;Supervision/safety;Set up Where Assessed - Upper Body Bathing: Sitting in shower Lower Body Bathing: Simulated (Min guard A) Where Assessed - Lower Body Bathing: Sit to stand in shower Upper Body Dressing: Simulated;Set up Where Assessed - Upper Body Dressing: Sitting, bed Lower Body Dressing: Simulated;Minimal assistance Where Assessed - Lower Body Dressing: Sit to stand from chair Toilet Transfer: Performed;Minimal assistance Toilet Transfer Details (indicate cue type and reason): pt unsteady with sit<->stand to/from toilet without grab bars. states he can use walls at home to steady self and wife will be there to assist. pt declines 3n1 at this time Toilet Transfer Method: Counselling psychologist: Regular height toilet Toileting - Clothing Manipulation: Simulated;Supervision/safety Where Assessed - Camera operator Manipulation: Standing Toileting - Hygiene: Simulated;Supervision/safety Where Assessed - Toileting Hygiene: Standing Tub/Shower Transfer: Simulated (min guard A) Tub/Shower Transfer Method: Therapist, art: Walk in shower Equipment Used: Rolling walker Ambulation Related to ADLs: supervision with ambulation- ? if pt drowsy secondary to pain medication? ADL Comments: All education completed- pt to d/c this pm Extremity Assessment RUE Assessment RUE Assessment: Within Functional Limits LUE Assessment LUE Assessment: Within Functional Limits Mobility  Bed Mobility Bed Mobility: Yes Supine to Sit: 6: Modified independent (Device/Increase time) Supine to Sit Details (indicate cue type and reason): hooked Rt foot under Lt ankle to move LLE across bed Sitting - Scoot to Edge of Bed: 6: Modified independent (Device/Increase time) Sit to Supine: 5: Supervision Sit to Supine - Details (indicate cue type and reason): Pt uses RLE to  assist with LLE into bed. VC throughout for safety Transfers Sit to Stand: 5: Supervision;From bed Stand to Sit: 5: Supervision;To chair/3-in-1;With armrests End of Session OT - End of Session Equipment Utilized During Treatment: Gait belt Activity Tolerance:  Patient tolerated treatment well Patient left:  (sitting EOB with PT) General Behavior During Session: [facility] for tasks performed Cognition: [facility] for tasks performed   Taylor King 11/27/2011, 11:17 AM

## 2011-11-27 NOTE — Progress Notes (Signed)
Physical Therapy Treatment Patient Details Name: Taylor King MRN: [id number] DOB: 08-06-1951 Today's Date: 11/27/2011  PT Assessment/Plan  PT - Assessment/Plan Comments on Treatment Session: Pt progressing well. Pts quadriceps strength is still limited against gravity as well as stiff knee flexion, potentially due to dressing. Continue per plan. Pt completed stairs and is safe to d/c home today. PT Plan: Discharge plan remains appropriate;Frequency remains appropriate PT Frequency: 7X/week Follow Up Recommendations: Home health PT;Supervision - Intermittent Equipment Recommended: Rolling walker with 5" wheels PT Goals  Acute Rehab PT Goals PT Goal Formulation: With patient PT Goal: Supine/Side to Sit - Progress: Progressing toward goal PT Goal: Sit to Supine/Side - Progress: Progressing toward goal PT Goal: Sit to Stand - Progress: Met PT Goal: Stand to Sit - Progress: Met PT Transfer Goal: Bed to Chair/Chair to Bed - Progress: Progressing toward goal PT Goal: Ambulate - Progress: Progressing toward goal PT Goal: Up/Down Stairs - Progress: Met PT Goal: Perform Home Exercise Program - Progress: Progressing toward goal  PT Treatment Precautions/Restrictions  Restrictions Weight Bearing Restrictions: Yes LLE Weight Bearing: Weight bearing as tolerated Mobility (including Balance) Bed Mobility Bed Mobility: Yes Supine to Sit: 5: Supervision Supine to Sit Details (indicate cue type and reason): VC for proper sequencing. Pt used RLE to assist with LLE out of bed Sitting - Scoot to Edge of Bed: 6: Modified independent (Device/Increase time) Sit to Supine: 5: Supervision Sit to Supine - Details (indicate cue type and reason): Pt uses RLE to assist with LLE into bed. VC throughout for safety Transfers Transfers: Yes Sit to Stand: 6: Modified independent (Device/Increase time);With upper extremity assist;From bed Stand to Sit: 6: Modified independent (Device/Increase  time) Ambulation/Gait Ambulation/Gait: Yes Ambulation/Gait Assistance: 5: Supervision Ambulation/Gait Assistance Details (indicate cue type and reason): VC for increased heel strike and to straighten left leg out during ambulation.  Ambulation Distance (Feet): 100 Feet Assistive device: Rolling walker Gait Pattern: Within Functional Limits;Decreased stance time - right;Decreased step length - left;Step-to pattern;Trunk flexed;Decreased weight shift to left;Left flexed knee in stance Gait velocity: Decreased gait speed Stairs: Yes Stairs Assistance: 5: Supervision Stairs Assistance Details (indicate cue type and reason): VC throughout for stair sequencing as well as safety. No physical assist needed Stair Management Technique: Two rails;Forwards Number of Stairs: 4     Exercise  Total Joint Exercises Quad Sets: AROM;Strengthening;10 reps;Supine;Left Heel Slides: AAROM;Strengthening;10 reps;Seated;Left (knee ROM 0-85) Straight Leg Raises: AAROM;Strengthening;Left;10 reps;Supine Long Arc Quad: AAROM;Strengthening;Left;10 reps;Seated (Pt with difficulty contracting quad against gravity) End of Session PT - End of Session Equipment Utilized During Treatment: Gait belt Activity Tolerance: Patient tolerated treatment well Patient left: in chair;with call bell in reach Nurse Communication: Mobility status for transfers;Mobility status for ambulation General Behavior During Session: [facility] for tasks performed Cognition: [facility] for tasks performed  Ambrose Finland 11/27/2011, 10:25 AM  11/27/2011 Ambrose Finland DPT PAGER: [phone] OFFICE: [phone]

## 2011-11-28 ENCOUNTER — Encounter (HOSPITAL_COMMUNITY): Payer: Self-pay | Admitting: Orthopedic Surgery

## 2014-09-18 ENCOUNTER — Ambulatory Visit
Admission: RE | Admit: 2014-09-18 | Discharge: 2014-09-18 | Disposition: A | Discharge status: Home / Self Care | Payer: Worker's Compensation | Source: Ambulatory Visit | Attending: Family Medicine | Admitting: Family Medicine

## 2014-09-18 ENCOUNTER — Other Ambulatory Visit: Payer: Self-pay | Admitting: Family Medicine

## 2014-09-18 DIAGNOSIS — M545 Low back pain: Secondary | ICD-10-CM

## 2014-09-22 ENCOUNTER — Other Ambulatory Visit: Payer: Self-pay | Admitting: Internal Medicine

## 2014-09-22 DIAGNOSIS — I714 Abdominal aortic aneurysm, without rupture, unspecified: Secondary | ICD-10-CM

## 2014-09-23 ENCOUNTER — Ambulatory Visit
Admission: RE | Admit: 2014-09-23 | Discharge: 2014-09-23 | Disposition: A | Discharge status: Home / Self Care | Payer: 59 | Source: Ambulatory Visit | Attending: Internal Medicine | Admitting: Internal Medicine

## 2014-09-23 DIAGNOSIS — I714 Abdominal aortic aneurysm, without rupture, unspecified: Secondary | ICD-10-CM

## 2015-10-01 ENCOUNTER — Other Ambulatory Visit: Payer: Self-pay | Admitting: Internal Medicine

## 2015-10-01 DIAGNOSIS — I714 Abdominal aortic aneurysm, without rupture, unspecified: Secondary | ICD-10-CM

## 2015-10-12 ENCOUNTER — Other Ambulatory Visit: Payer: Self-pay | Admitting: Internal Medicine

## 2015-10-12 ENCOUNTER — Ambulatory Visit
Admission: RE | Admit: 2015-10-12 | Discharge: 2015-10-12 | Disposition: A | Discharge status: Home / Self Care | Payer: 59 | Source: Ambulatory Visit | Attending: Internal Medicine | Admitting: Internal Medicine

## 2015-10-12 DIAGNOSIS — I714 Abdominal aortic aneurysm, without rupture, unspecified: Secondary | ICD-10-CM

## 2016-12-21 ENCOUNTER — Other Ambulatory Visit: Payer: Self-pay | Admitting: Gastroenterology

## 2017-02-09 ENCOUNTER — Other Ambulatory Visit: Payer: Self-pay | Admitting: Internal Medicine

## 2017-02-09 DIAGNOSIS — I714 Abdominal aortic aneurysm, without rupture, unspecified: Secondary | ICD-10-CM

## 2017-02-13 DIAGNOSIS — M79644 Pain in right finger(s): Secondary | ICD-10-CM | POA: Diagnosis not present

## 2017-02-14 ENCOUNTER — Ambulatory Visit
Admission: RE | Admit: 2017-02-14 | Discharge: 2017-02-14 | Disposition: A | Discharge status: Home / Self Care | Payer: PPO | Source: Ambulatory Visit | Attending: Internal Medicine | Admitting: Internal Medicine

## 2017-02-14 DIAGNOSIS — I714 Abdominal aortic aneurysm, without rupture, unspecified: Secondary | ICD-10-CM

## 2017-03-06 ENCOUNTER — Encounter (HOSPITAL_COMMUNITY): Payer: Self-pay | Admitting: *Deleted

## 2017-03-07 ENCOUNTER — Ambulatory Visit (HOSPITAL_COMMUNITY)
Admission: RE | Admit: 2017-03-07 | Discharge: 2017-03-07 | Disposition: A | Discharge status: Home / Self Care | Payer: PPO | Source: Ambulatory Visit | Attending: Gastroenterology | Admitting: Gastroenterology

## 2017-03-07 ENCOUNTER — Ambulatory Visit (HOSPITAL_COMMUNITY): Payer: PPO | Admitting: Anesthesiology

## 2017-03-07 ENCOUNTER — Encounter (HOSPITAL_COMMUNITY)
Admission: RE | Disposition: A | Discharge status: Home / Self Care | Payer: Self-pay | Source: Ambulatory Visit | Attending: Gastroenterology

## 2017-03-07 DIAGNOSIS — Z96651 Presence of right artificial knee joint: Secondary | ICD-10-CM | POA: Diagnosis not present

## 2017-03-07 DIAGNOSIS — D120 Benign neoplasm of cecum: Secondary | ICD-10-CM | POA: Diagnosis not present

## 2017-03-07 DIAGNOSIS — I739 Peripheral vascular disease, unspecified: Secondary | ICD-10-CM | POA: Insufficient documentation

## 2017-03-07 DIAGNOSIS — K635 Polyp of colon: Secondary | ICD-10-CM | POA: Diagnosis not present

## 2017-03-07 DIAGNOSIS — K5730 Diverticulosis of large intestine without perforation or abscess without bleeding: Secondary | ICD-10-CM | POA: Diagnosis not present

## 2017-03-07 DIAGNOSIS — I714 Abdominal aortic aneurysm, without rupture: Secondary | ICD-10-CM | POA: Diagnosis not present

## 2017-03-07 DIAGNOSIS — Z1211 Encounter for screening for malignant neoplasm of colon: Secondary | ICD-10-CM | POA: Diagnosis not present

## 2017-03-07 DIAGNOSIS — Z86010 Personal history of colonic polyps: Secondary | ICD-10-CM | POA: Insufficient documentation

## 2017-03-07 DIAGNOSIS — Z87891 Personal history of nicotine dependence: Secondary | ICD-10-CM | POA: Diagnosis not present

## 2017-03-07 DIAGNOSIS — D122 Benign neoplasm of ascending colon: Secondary | ICD-10-CM | POA: Diagnosis not present

## 2017-03-07 HISTORY — DX: Peripheral vascular disease, unspecified: I73.9

## 2017-03-07 HISTORY — DX: Other chronic pain: G89.29

## 2017-03-07 HISTORY — PX: COLONOSCOPY WITH PROPOFOL: SHX5780

## 2017-03-07 HISTORY — DX: Dorsalgia, unspecified: M54.9

## 2017-03-07 SURGERY — COLONOSCOPY WITH PROPOFOL
Anesthesia: Monitor Anesthesia Care

## 2017-03-07 MED ORDER — PROPOFOL 10 MG/ML IV BOLUS
INTRAVENOUS | Status: DC | PRN
  Administered 2017-03-07 (×2): 20 mg via INTRAVENOUS

## 2017-03-07 MED ORDER — LIDOCAINE 2% (20 MG/ML) 5 ML SYRINGE
INTRAMUSCULAR | Status: DC | PRN
  Administered 2017-03-07: 60 mg via INTRAVENOUS

## 2017-03-07 MED ORDER — SODIUM CHLORIDE 0.9 % IV SOLN: INTRAVENOUS | Status: DC

## 2017-03-07 MED ORDER — PROPOFOL 500 MG/50ML IV EMUL
INTRAVENOUS | Status: DC | PRN
  Administered 2017-03-07: 125 ug/kg/min via INTRAVENOUS

## 2017-03-07 MED ORDER — LIDOCAINE 2% (20 MG/ML) 5 ML SYRINGE
INTRAMUSCULAR | Status: AC
  Filled 2017-03-07: qty 5

## 2017-03-07 MED ORDER — PROPOFOL 10 MG/ML IV BOLUS
INTRAVENOUS | Status: AC
  Filled 2017-03-07: qty 60

## 2017-03-07 MED ORDER — LACTATED RINGERS IV SOLN
INTRAVENOUS | Status: DC
  Administered 2017-03-07: 1000 mL via INTRAVENOUS

## 2017-03-07 SURGICAL SUPPLY — 21 items

## 2017-03-07 NOTE — Transfer of Care (Signed)
Immediate Anesthesia Transfer of Care Note  Patient: Taylor King  Procedure(s) Performed: Procedure(s): COLONOSCOPY WITH PROPOFOL (N/A)  Patient Location: PACU and Endoscopy Unit  Anesthesia Type:MAC  Level of Consciousness: awake and patient cooperative  Airway & Oxygen Therapy: Patient Spontanous Breathing and Patient connected to face mask oxygen  Post-op Assessment: Report given to RN and Post -op Vital signs reviewed and stable  Post vital signs: Reviewed and stable  Last Vitals:  Vitals:   03/07/17 1021  BP: 134/83  Pulse: 60  Resp: 16  Temp: 36.8 C    Last Pain:  Vitals:   03/07/17 1021  TempSrc: Oral         Complications: No apparent anesthesia complications

## 2017-03-07 NOTE — Discharge Instructions (Signed)

## 2017-03-07 NOTE — Op Note (Signed)
[facility] Patient Name: Taylor King Procedure Date: 03/07/2017 MRN: [id number] Attending MD: Garlan Fair , MD Date of Birth: [birth year] CSN: [id number] Age: 66 Admit Type: Outpatient Procedure:                Colonoscopy Indications:              High risk colon cancer surveillance: Personal                            history of adenoma less than 10 mm in size Providers:                Garlan Fair, MD, Laverta Baltimore RN, RN,                            Cherylynn Ridges, Technician, Dione Booze, CRNA Referring MD:              Medicines:                Propofol per Anesthesia Complications:            No immediate complications. Estimated Blood Loss:     Estimated blood loss was minimal. Procedure:                Pre-Anesthesia Assessment:                           - Prior to the procedure, a History and Physical                            was performed, and patient medications and                            allergies were reviewed. The patient's tolerance of                            previous anesthesia was also reviewed. The risks                            and benefits of the procedure and the sedation                            options and risks were discussed with the patient.                            All questions were answered, and informed consent                            was obtained. Prior Anticoagulants: The patient has                            taken no previous anticoagulant or antiplatelet                            agents. ASA Grade Assessment: II - A patient with  mild systemic disease. After reviewing the risks                            and benefits, the patient was deemed in                            satisfactory condition to undergo the procedure.                           After obtaining informed consent, the colonoscope                            was passed under direct vision. Throughout the                         procedure, the patient's blood pressure, pulse, and                            oxygen saturations were monitored continuously. The                            EC-[id number] ([id number]) scope was introduced through                            the anus and advanced to the the cecum, identified                            by appendiceal orifice and ileocecal valve. The                            colonoscopy was somewhat difficult due to                            significant looping. The patient tolerated the                            procedure well. The quality of the bowel                            preparation was good. The appendiceal orifice and                            the rectum were photographed. Scope In: 11:18:35 AM Scope Out: 11:52:19 AM Scope Withdrawal Time: 0 hours 26 minutes 38 seconds  Total Procedure Duration: 0 hours 33 minutes 44 seconds  Findings:      The perianal and digital rectal examinations were normal.      A 12 mm polyp was found in the proximal cecum. The polyp was       semi-pedunculated. The polyp was removed with a piecemeal technique       using a hot and cold snare. Resection and retrieval were complete. An       endoclip was applied to the polypectomy site.      Two sessile polyps were found in the ascending colon. The polyps were 3  mm in size. These polyps were removed with a cold snare. Resection and       retrieval were complete.      The exam was otherwise without abnormality. Impression:               - One 12 mm polyp in the cecum, removed piecemeal                            using a hot snare. Resected and retrieved.                           - Two 3 mm polyps in the ascending colon, removed                            with a cold snare. Resected and retrieved.                           - The examination was otherwise normal. Moderate Sedation:      N/A- Per Anesthesia Care Recommendation:           - Patient has a contact  number available for                            emergencies. The signs and symptoms of potential                            delayed complications were discussed with the                            patient. Return to normal activities tomorrow.                            Written discharge instructions were provided to the                            patient.                           - Repeat colonoscopy in 3 years for surveillance.                           - Resume previous diet.                           - Continue present medications. Procedure Code(s):        --- Professional ---                           [id number], Colonoscopy, flexible; with removal of                            tumor(s), polyp(s), or other lesion(s) by snare                            technique Diagnosis Code(s):        --- Professional ---  Z86.010, Personal history of colonic polyps                           D12.0, Benign neoplasm of cecum                           D12.2, Benign neoplasm of ascending colon CPT copyright 2016 American Medical Association. All rights reserved. The codes documented in this report are preliminary and upon coder review may  be revised to meet current compliance requirements. Earle Gell, MD Garlan Fair, MD 03/07/2017 11:58:33 AM This report has been signed electronically. Number of Addenda: 0

## 2017-03-07 NOTE — Anesthesia Postprocedure Evaluation (Signed)
Anesthesia Post Note  Patient: Taylor King  Procedure(s) Performed: Procedure(s) (LRB): COLONOSCOPY WITH PROPOFOL (N/A)  Patient location during evaluation: PACU Anesthesia Type: MAC Level of consciousness: awake and alert Pain management: pain level controlled Vital Signs Assessment: post-procedure vital signs reviewed and stable Respiratory status: spontaneous breathing, nonlabored ventilation, respiratory function stable and patient connected to nasal cannula oxygen Cardiovascular status: stable and blood pressure returned to baseline Anesthetic complications: no       Last Vitals:  Vitals:   03/07/17 1210 03/07/17 1211  BP:  130/83  Pulse: (!) 54 (!) 55  Resp: (!) 23 20  Temp:      Last Pain:  Vitals:   03/07/17 1157  TempSrc: Oral                 Sloane Junkin S

## 2017-03-07 NOTE — Anesthesia Preprocedure Evaluation (Signed)
Anesthesia Evaluation  Patient identified by MRN, date of birth, ID band Patient awake    Reviewed: Allergy & Precautions, NPO status , Patient's Chart, lab work & pertinent test results  Airway Mallampati: II  TM Distance: >3 FB Neck ROM: Full    Dental no notable dental hx.    Pulmonary neg pulmonary ROS, former smoker,    Pulmonary exam normal breath sounds clear to auscultation       Cardiovascular + Peripheral Vascular Disease  Normal cardiovascular exam Rhythm:Regular Rate:Normal     Neuro/Psych negative neurological ROS  negative psych ROS   GI/Hepatic negative GI ROS, Neg liver ROS,   Endo/Other  negative endocrine ROS  Renal/GU negative Renal ROS  negative genitourinary   Musculoskeletal negative musculoskeletal ROS (+)   Abdominal   Peds negative pediatric ROS (+)  Hematology negative hematology ROS (+)   Anesthesia Other Findings   Reproductive/Obstetrics negative OB ROS                             Anesthesia Physical Anesthesia Plan  ASA: III  Anesthesia Plan: MAC   Post-op Pain Management:    Induction: Intravenous  Airway Management Planned: Nasal Cannula  Additional Equipment:   Intra-op Plan:   Post-operative Plan:   Informed Consent: I have reviewed the patients History and Physical, chart, labs and discussed the procedure including the risks, benefits and alternatives for the proposed anesthesia with the patient or authorized representative who has indicated his/her understanding and acceptance.   Dental advisory given  Plan Discussed with: CRNA and Surgeon  Anesthesia Plan Comments:         Anesthesia Quick Evaluation

## 2017-03-07 NOTE — Anesthesia Procedure Notes (Signed)
Procedure Name: MAC Date/Time: 03/07/2017 11:13 AM Performed by: Dione Booze Pre-anesthesia Checklist: Patient identified, Emergency Drugs available, Suction available and Patient being monitored Patient Re-evaluated:Patient Re-evaluated prior to inductionOxygen Delivery Method: Simple face mask Placement Confirmation: positive ETCO2

## 2017-03-07 NOTE — H&P (Signed)
Procedure: Surveillance colonoscopy. 06/04/2009 colonoscopy was performed with removal of a 4 mm tubular adenomatous cecal polyp  History: The patient is a 66 year old male born 01/29/1953. He is scheduled to undergo a surveillance colonoscopy today.  Past medical history: Right total knee replacement surgery. 3.2 cm abdominal aortic aneurysm. Osteoarthritis. Her bowel syndrome. Seasonal allergies.  Exam: The patient is alert and lying comfortably on the endoscopy stretcher. Abdomen is soft and nontender to palpation. Cardiac exam reveals a regular rhythm. Lungs are clear to auscultation.  Plan: Proceed with surveillance colonoscopy

## 2017-03-09 ENCOUNTER — Encounter (HOSPITAL_COMMUNITY): Payer: Self-pay | Admitting: Gastroenterology

## 2017-04-17 NOTE — Anesthesia Postprocedure Evaluation (Signed)
Anesthesia Post Note  Patient: Taylor King [facility]  Procedure(s) Performed: Procedure(s) (LRB): COLONOSCOPY WITH PROPOFOL (N/A)     Anesthesia Post Evaluation  Last Vitals:  Vitals:   03/07/17 1211 03/07/17 1220  BP: 130/83 140/76  Pulse: (!) 55 (!) 54  Resp: 20 18  Temp:      Last Pain:  Vitals:   03/08/17 1329  TempSrc:   PainSc: 0-No pain                 Roda Lauture S

## 2017-04-17 NOTE — Addendum Note (Signed)
Addendum  created 04/17/17 1344 by Myrtie Soman, MD   Sign clinical note

## 2017-09-17 DIAGNOSIS — R1032 Left lower quadrant pain: Secondary | ICD-10-CM | POA: Diagnosis not present

## 2017-09-17 DIAGNOSIS — N23 Unspecified renal colic: Secondary | ICD-10-CM | POA: Diagnosis not present

## 2017-09-19 ENCOUNTER — Ambulatory Visit
Admission: RE | Admit: 2017-09-19 | Discharge: 2017-09-19 | Disposition: A | Discharge status: Home / Self Care | Payer: PPO | Source: Ambulatory Visit | Attending: Internal Medicine | Admitting: Internal Medicine

## 2017-09-19 ENCOUNTER — Other Ambulatory Visit: Payer: Self-pay | Admitting: Internal Medicine

## 2017-09-19 DIAGNOSIS — R1084 Generalized abdominal pain: Secondary | ICD-10-CM

## 2017-09-19 DIAGNOSIS — R109 Unspecified abdominal pain: Secondary | ICD-10-CM | POA: Diagnosis not present

## 2017-09-19 DIAGNOSIS — N201 Calculus of ureter: Secondary | ICD-10-CM | POA: Diagnosis not present

## 2017-09-19 MED ORDER — IOPAMIDOL (ISOVUE-300) INJECTION 61%: 100.0000 mL | Freq: Once | INTRAVENOUS | Status: DC | PRN

## 2017-09-29 DIAGNOSIS — N281 Cyst of kidney, acquired: Secondary | ICD-10-CM | POA: Diagnosis not present

## 2017-09-29 DIAGNOSIS — R1032 Left lower quadrant pain: Secondary | ICD-10-CM | POA: Diagnosis not present

## 2017-09-29 DIAGNOSIS — N201 Calculus of ureter: Secondary | ICD-10-CM | POA: Diagnosis not present

## 2017-10-19 DIAGNOSIS — N201 Calculus of ureter: Secondary | ICD-10-CM | POA: Diagnosis not present

## 2017-10-19 DIAGNOSIS — N281 Cyst of kidney, acquired: Secondary | ICD-10-CM | POA: Diagnosis not present

## 2017-11-23 DIAGNOSIS — N281 Cyst of kidney, acquired: Secondary | ICD-10-CM | POA: Diagnosis not present

## 2017-11-23 DIAGNOSIS — N201 Calculus of ureter: Secondary | ICD-10-CM | POA: Diagnosis not present

## 2017-12-26 DIAGNOSIS — L708 Other acne: Secondary | ICD-10-CM | POA: Diagnosis not present

## 2017-12-26 DIAGNOSIS — L728 Other follicular cysts of the skin and subcutaneous tissue: Secondary | ICD-10-CM | POA: Diagnosis not present

## 2017-12-26 DIAGNOSIS — L578 Other skin changes due to chronic exposure to nonionizing radiation: Secondary | ICD-10-CM | POA: Diagnosis not present

## 2018-01-12 DIAGNOSIS — M545 Low back pain: Secondary | ICD-10-CM | POA: Diagnosis not present

## 2018-01-12 DIAGNOSIS — R030 Elevated blood-pressure reading, without diagnosis of hypertension: Secondary | ICD-10-CM | POA: Diagnosis not present

## 2018-01-12 DIAGNOSIS — Z23 Encounter for immunization: Secondary | ICD-10-CM | POA: Diagnosis not present

## 2018-01-12 DIAGNOSIS — Z136 Encounter for screening for cardiovascular disorders: Secondary | ICD-10-CM | POA: Diagnosis not present

## 2018-01-12 DIAGNOSIS — Z0000 Encounter for general adult medical examination without abnormal findings: Secondary | ICD-10-CM | POA: Diagnosis not present

## 2018-01-12 DIAGNOSIS — Z125 Encounter for screening for malignant neoplasm of prostate: Secondary | ICD-10-CM | POA: Diagnosis not present

## 2018-01-12 DIAGNOSIS — I714 Abdominal aortic aneurysm, without rupture: Secondary | ICD-10-CM | POA: Diagnosis not present

## 2018-03-08 DIAGNOSIS — F419 Anxiety disorder, unspecified: Secondary | ICD-10-CM | POA: Diagnosis not present

## 2018-03-08 DIAGNOSIS — G8929 Other chronic pain: Secondary | ICD-10-CM | POA: Insufficient documentation

## 2018-03-08 DIAGNOSIS — M5136 Other intervertebral disc degeneration, lumbar region: Secondary | ICD-10-CM | POA: Insufficient documentation

## 2018-03-08 DIAGNOSIS — M545 Low back pain, unspecified: Secondary | ICD-10-CM | POA: Insufficient documentation

## 2018-03-15 DIAGNOSIS — M5136 Other intervertebral disc degeneration, lumbar region: Secondary | ICD-10-CM | POA: Diagnosis not present

## 2018-03-28 DIAGNOSIS — R102 Pelvic and perineal pain: Secondary | ICD-10-CM | POA: Diagnosis not present

## 2018-04-23 ENCOUNTER — Telehealth (HOSPITAL_COMMUNITY): Payer: Self-pay

## 2018-04-23 NOTE — Telephone Encounter (Signed)
Called to schedule consult for compression fracture. Spoke to pt's wife. He wasn't home at the time. Will call back in a couple hours to schedule when he returns. AW

## 2018-05-01 ENCOUNTER — Other Ambulatory Visit (HOSPITAL_COMMUNITY): Payer: Self-pay | Admitting: Interventional Radiology

## 2018-05-07 ENCOUNTER — Other Ambulatory Visit (HOSPITAL_COMMUNITY): Payer: Self-pay | Admitting: Chiropractic Medicine

## 2018-05-07 DIAGNOSIS — M545 Low back pain: Secondary | ICD-10-CM

## 2018-05-09 DIAGNOSIS — M5136 Other intervertebral disc degeneration, lumbar region: Secondary | ICD-10-CM | POA: Diagnosis not present

## 2018-05-26 DIAGNOSIS — M5136 Other intervertebral disc degeneration, lumbar region: Secondary | ICD-10-CM | POA: Diagnosis not present

## 2018-06-12 DIAGNOSIS — M5136 Other intervertebral disc degeneration, lumbar region: Secondary | ICD-10-CM | POA: Diagnosis not present

## 2018-06-12 DIAGNOSIS — M545 Low back pain: Secondary | ICD-10-CM | POA: Diagnosis not present

## 2018-10-06 DIAGNOSIS — M5136 Other intervertebral disc degeneration, lumbar region: Secondary | ICD-10-CM | POA: Diagnosis not present

## 2018-12-17 DIAGNOSIS — M5136 Other intervertebral disc degeneration, lumbar region: Secondary | ICD-10-CM | POA: Diagnosis not present

## 2018-12-17 DIAGNOSIS — M5416 Radiculopathy, lumbar region: Secondary | ICD-10-CM | POA: Diagnosis not present

## 2019-01-08 DIAGNOSIS — M5416 Radiculopathy, lumbar region: Secondary | ICD-10-CM | POA: Diagnosis not present

## 2019-01-22 DIAGNOSIS — N281 Cyst of kidney, acquired: Secondary | ICD-10-CM | POA: Diagnosis not present

## 2019-01-22 DIAGNOSIS — N200 Calculus of kidney: Secondary | ICD-10-CM | POA: Diagnosis not present

## 2019-01-29 DIAGNOSIS — N200 Calculus of kidney: Secondary | ICD-10-CM | POA: Diagnosis not present

## 2019-02-05 DIAGNOSIS — Z1389 Encounter for screening for other disorder: Secondary | ICD-10-CM | POA: Diagnosis not present

## 2019-02-05 DIAGNOSIS — Z0000 Encounter for general adult medical examination without abnormal findings: Secondary | ICD-10-CM | POA: Diagnosis not present

## 2019-02-05 DIAGNOSIS — I714 Abdominal aortic aneurysm, without rupture: Secondary | ICD-10-CM | POA: Diagnosis not present

## 2019-02-05 DIAGNOSIS — Z23 Encounter for immunization: Secondary | ICD-10-CM | POA: Diagnosis not present

## 2019-02-05 DIAGNOSIS — Z125 Encounter for screening for malignant neoplasm of prostate: Secondary | ICD-10-CM | POA: Diagnosis not present

## 2019-02-05 DIAGNOSIS — N200 Calculus of kidney: Secondary | ICD-10-CM | POA: Diagnosis not present

## 2019-02-25 DIAGNOSIS — F0630 Mood disorder due to known physiological condition, unspecified: Secondary | ICD-10-CM | POA: Diagnosis not present

## 2019-02-25 DIAGNOSIS — G3184 Mild cognitive impairment, so stated: Secondary | ICD-10-CM | POA: Diagnosis not present

## 2019-02-25 DIAGNOSIS — N200 Calculus of kidney: Secondary | ICD-10-CM | POA: Diagnosis not present

## 2019-02-25 DIAGNOSIS — G4733 Obstructive sleep apnea (adult) (pediatric): Secondary | ICD-10-CM | POA: Diagnosis not present

## 2019-02-25 DIAGNOSIS — R030 Elevated blood-pressure reading, without diagnosis of hypertension: Secondary | ICD-10-CM | POA: Diagnosis not present

## 2019-02-25 DIAGNOSIS — R001 Bradycardia, unspecified: Secondary | ICD-10-CM | POA: Diagnosis not present

## 2019-03-12 DIAGNOSIS — N200 Calculus of kidney: Secondary | ICD-10-CM | POA: Diagnosis not present

## 2019-03-22 DIAGNOSIS — N200 Calculus of kidney: Secondary | ICD-10-CM | POA: Diagnosis not present

## 2019-06-03 DIAGNOSIS — M545 Low back pain: Secondary | ICD-10-CM | POA: Diagnosis not present

## 2019-06-03 DIAGNOSIS — M5136 Other intervertebral disc degeneration, lumbar region: Secondary | ICD-10-CM | POA: Diagnosis not present

## 2019-06-24 DIAGNOSIS — N200 Calculus of kidney: Secondary | ICD-10-CM | POA: Diagnosis not present

## 2019-06-28 DIAGNOSIS — M5416 Radiculopathy, lumbar region: Secondary | ICD-10-CM | POA: Diagnosis not present

## 2019-06-28 DIAGNOSIS — I10 Essential (primary) hypertension: Secondary | ICD-10-CM | POA: Insufficient documentation

## 2019-06-28 DIAGNOSIS — M48061 Spinal stenosis, lumbar region without neurogenic claudication: Secondary | ICD-10-CM | POA: Diagnosis not present

## 2019-06-28 DIAGNOSIS — Z87442 Personal history of urinary calculi: Secondary | ICD-10-CM | POA: Insufficient documentation

## 2019-06-28 DIAGNOSIS — M5136 Other intervertebral disc degeneration, lumbar region: Secondary | ICD-10-CM | POA: Diagnosis not present

## 2019-06-28 DIAGNOSIS — M545 Low back pain: Secondary | ICD-10-CM | POA: Diagnosis not present

## 2019-07-06 DIAGNOSIS — M545 Low back pain: Secondary | ICD-10-CM | POA: Diagnosis not present

## 2019-07-12 DIAGNOSIS — M5136 Other intervertebral disc degeneration, lumbar region: Secondary | ICD-10-CM | POA: Diagnosis not present

## 2019-07-19 ENCOUNTER — Other Ambulatory Visit: Payer: Self-pay | Admitting: Orthopedic Surgery

## 2019-07-19 DIAGNOSIS — M5136 Other intervertebral disc degeneration, lumbar region: Secondary | ICD-10-CM

## 2019-07-26 ENCOUNTER — Ambulatory Visit
Admission: RE | Admit: 2019-07-26 | Discharge: 2019-07-26 | Disposition: A | Discharge status: Home / Self Care | Payer: Self-pay | Source: Ambulatory Visit | Attending: Orthopedic Surgery | Admitting: Orthopedic Surgery

## 2019-07-26 ENCOUNTER — Other Ambulatory Visit: Payer: Self-pay | Admitting: Orthopedic Surgery

## 2019-07-26 DIAGNOSIS — M5136 Other intervertebral disc degeneration, lumbar region: Secondary | ICD-10-CM

## 2019-07-26 NOTE — Discharge Instructions (Signed)

## 2019-07-29 ENCOUNTER — Ambulatory Visit
Admission: RE | Admit: 2019-07-29 | Discharge: 2019-07-29 | Disposition: A | Discharge status: Home / Self Care | Payer: PPO | Source: Ambulatory Visit | Attending: Orthopedic Surgery | Admitting: Orthopedic Surgery

## 2019-07-29 DIAGNOSIS — M5136 Other intervertebral disc degeneration, lumbar region: Secondary | ICD-10-CM | POA: Diagnosis not present

## 2019-07-29 MED ORDER — IOPAMIDOL (ISOVUE-M 200) INJECTION 41%
1.0000 mL | Freq: Once | INTRAMUSCULAR | Status: AC
  Administered 2019-07-29: 1 mL

## 2019-07-29 MED ORDER — METHYLPREDNISOLONE ACETATE 40 MG/ML INJ SUSP (RADIOLOG
120.0000 mg | Freq: Once | INTRAMUSCULAR | Status: AC
  Administered 2019-07-29: 120 mg via INTRALESIONAL

## 2019-07-29 MED ORDER — CEFAZOLIN SODIUM-DEXTROSE 2-4 GM/100ML-% IV SOLN
2.0000 g | INTRAVENOUS | Status: AC
  Administered 2019-07-29: 2 g via INTRAVENOUS

## 2019-08-19 DIAGNOSIS — M5136 Other intervertebral disc degeneration, lumbar region: Secondary | ICD-10-CM | POA: Diagnosis not present

## 2019-09-24 DIAGNOSIS — N200 Calculus of kidney: Secondary | ICD-10-CM | POA: Diagnosis not present

## 2019-12-26 DIAGNOSIS — N281 Cyst of kidney, acquired: Secondary | ICD-10-CM | POA: Diagnosis not present

## 2019-12-26 DIAGNOSIS — R3121 Asymptomatic microscopic hematuria: Secondary | ICD-10-CM | POA: Diagnosis not present

## 2019-12-26 DIAGNOSIS — N200 Calculus of kidney: Secondary | ICD-10-CM | POA: Diagnosis not present

## 2020-02-17 DIAGNOSIS — E538 Deficiency of other specified B group vitamins: Secondary | ICD-10-CM | POA: Diagnosis not present

## 2020-02-17 DIAGNOSIS — N200 Calculus of kidney: Secondary | ICD-10-CM | POA: Diagnosis not present

## 2020-02-17 DIAGNOSIS — I714 Abdominal aortic aneurysm, without rupture: Secondary | ICD-10-CM | POA: Diagnosis not present

## 2020-02-17 DIAGNOSIS — Z1389 Encounter for screening for other disorder: Secondary | ICD-10-CM | POA: Diagnosis not present

## 2020-02-17 DIAGNOSIS — Z86010 Personal history of colonic polyps: Secondary | ICD-10-CM | POA: Diagnosis not present

## 2020-02-17 DIAGNOSIS — Z0000 Encounter for general adult medical examination without abnormal findings: Secondary | ICD-10-CM | POA: Diagnosis not present

## 2020-02-17 DIAGNOSIS — Z125 Encounter for screening for malignant neoplasm of prostate: Secondary | ICD-10-CM | POA: Diagnosis not present

## 2020-02-26 DIAGNOSIS — I714 Abdominal aortic aneurysm, without rupture: Secondary | ICD-10-CM | POA: Diagnosis not present

## 2020-02-26 DIAGNOSIS — Z8679 Personal history of other diseases of the circulatory system: Secondary | ICD-10-CM | POA: Diagnosis not present

## 2020-03-03 ENCOUNTER — Ambulatory Visit: Payer: Self-pay | Admitting: Orthopedic Surgery

## 2020-03-17 DIAGNOSIS — H5053 Vertical heterophoria: Secondary | ICD-10-CM | POA: Diagnosis not present

## 2020-03-17 DIAGNOSIS — H532 Diplopia: Secondary | ICD-10-CM | POA: Diagnosis not present

## 2020-03-17 DIAGNOSIS — R519 Headache, unspecified: Secondary | ICD-10-CM | POA: Diagnosis not present

## 2020-03-18 DIAGNOSIS — Z7689 Persons encountering health services in other specified circumstances: Secondary | ICD-10-CM | POA: Diagnosis not present

## 2020-03-19 DIAGNOSIS — I6781 Acute cerebrovascular insufficiency: Secondary | ICD-10-CM | POA: Diagnosis not present

## 2020-03-19 DIAGNOSIS — R519 Headache, unspecified: Secondary | ICD-10-CM | POA: Diagnosis not present

## 2020-03-19 DIAGNOSIS — H532 Diplopia: Secondary | ICD-10-CM | POA: Diagnosis not present

## 2020-03-19 DIAGNOSIS — R110 Nausea: Secondary | ICD-10-CM | POA: Diagnosis not present

## 2020-03-19 DIAGNOSIS — Z135 Encounter for screening for eye and ear disorders: Secondary | ICD-10-CM | POA: Diagnosis not present

## 2020-03-20 ENCOUNTER — Ambulatory Visit: Payer: Self-pay | Admitting: Orthopedic Surgery

## 2020-03-20 DIAGNOSIS — M545 Low back pain: Secondary | ICD-10-CM | POA: Diagnosis not present

## 2020-03-20 NOTE — H&P (Deleted)
  The note originally documented on this encounter has been moved the the encounter in which it belongs.  

## 2020-03-20 NOTE — H&P (Signed)
Subjective:   Taylor King is a pleasant 69 year old male who continues to have severe debilitating back pain which is adversely affecting his quality-of-life. He has difficulty ambulating, and his overall quality-of-life is significantly hindered. Patient recalls during the 4-6 weeks post L2-3 intradiscal injection significant improvement in his quality-of-life and pain reduction. At this point time given the failure of physical therapy, self directed exercise program, injection therapy, and change in lifestyle his quality-of-life has continued to deteriorate. Therefore he has elected to forward with surgical intervention. He is scheduled for L2-3 XLIF w/PSFI L2-3 on 03/26/20 With Dr. Rolena Infante at [facility].  Of note, the patient recently had a brain MRI performed that no one health for headaches and vision changes.  Past Medical History:  Diagnosis Date  . Arthritis   . Chronic back pain   . GERD (gastroesophageal reflux disease)    occ  . Peripheral vascular disease ([place])    aaa 39 mm 02-14-17 epic [place]    Past Surgical History:  Procedure Laterality Date  . adenoids removed   age 59  . COLONOSCOPY WITH PROPOFOL N/A 03/07/2017   Procedure: COLONOSCOPY WITH PROPOFOL;  Surgeon: Garlan Fair, MD;  Location: [facility];  Service: Endoscopy;  Laterality: N/A;  . colonscopy     x 3  . KNEE ARTHROSCOPY  [id number]   left knee  . TONSILLECTOMY  age 88  . TOTAL KNEE ARTHROPLASTY  11/25/2011   Procedure: TOTAL KNEE ARTHROPLASTY;  Surgeon: Newt Minion, MD;  Location: [place];  Service: Orthopedics;  Laterality: Left;  Left Total Knee Arthroplasty    Current Outpatient Medications  Medication Sig Dispense Refill Last Dose  . allopurinol (ZYLOPRIM) 300 MG tablet Take 300 mg by mouth daily.     Marland Kitchen atenolol (TENORMIN) 25 MG tablet Take 12.5 mg by mouth daily.  2   . Potassium Citrate 15 MEQ (1620 MG) TBCR Take 15 mEq by mouth in the morning and at bedtime.     . vitamin B-12 (CYANOCOBALAMIN) 1000 MCG tablet  Take 1,000 mcg by mouth daily.      No current facility-administered medications for this visit.   Allergies  Allergen Reactions  . Other Other (See Comments)    Anesthesia medication (pt unsure of which med)- reaction upset stomach.    Social History   Tobacco Use  . Smoking status: Former Smoker    Packs/day: 0.25    Years: 30.00    Pack years: 7.50    Types: Cigarettes  . Smokeless tobacco: Never Used  . Tobacco comment: quit 2016  Substance Use Topics  . Alcohol use: No    No family history on file.  Review of Systems As stated in HPI  Objective:   Vitals: Ht: 5 ft 8 in 03/20/2020 01:35 pm Wt: 175 lbs 03/20/2020 01:35 pm BMI: 26.6 03/20/2020 01:35 pm BP: 128/70 03/20/2020 01:36 pm Pulse: 71 bpm 03/20/2020 01:36 pm T: 97.9 F 03/20/2020 01:36 pm Pain Scale: 5 03/20/2020 01:35 pm  Clinical exam: Patient is alert and oriented 3. No shortness of breath, chest pain.  Heart: Regular rate and rhythm, no rubs, murmurs, or gallops  Lungs: Clear to auscultation bilaterally  Abdomen: L sounds 4. Soft and nontender. No loss of bowel and bladder control. No rebound tenderness.  Lumbar spine: No significant back pain. Improved range of motion.  Neuro: Intact with no focal deficits. Negative nerve retention signs. Normal gait pattern  Reflexes: Babinski: Negative  X-rays from his Previous visit were reviewed. While he  does have some degree of degeneration at the L3-4 level he has more advanced collapse at the L2-3 level. I reviewed my last note and which indicated significant degenerative disease at L3-4 this should be at the L2-3 level.  Lumbar MRI: completed on 07/06/19 was reviewed with the patient. I have also reviewed the radiology report. Slight increase in the L3-4 left disc protrusion but no significant neural compression. It mildly impinges on the descending L4 nerve root. No significant spinal stenosis noted in the lumbar spine. He does have advanced degenerative  disc disease especially at the L2-3 level. Degenerative grade 1 slight anterolisthesis L5 on S1. We are recommending an updated MRI prior to surgery.  Patient recalls during the 4-6 weeks post L2-3 intradiscal injection significant improvement in his quality-of-life and pain reduction.  Assessment:   Taylor King is a pleasant 69 year old male who continues to have severe debilitating back pain which is adversely affecting his quality-of-life. He has difficulty ambulating, and his overall quality-of-life is significantly hindered. Patient recalls during the 4-6 weeks post L2-3 intradiscal injection significant improvement in his quality-of-life and pain reduction. At this point time given the failure of physical therapy, self directed exercise program, injection therapy, and change in lifestyle his quality-of-life has continued to deteriorate. We have had a long discussion about treatment at this point I have recommended an L2-3 lateral interbody fusion with posterior pedicle screw supplementation.    Plan:   I have gone over the surgery using the model as well as the animated video. I have discussed the risks and benefits of surgery with him in great detail and all of his questions were encouraged and addressed.Risks, benefits of surgery were reviewed with the patient. These include: infection, bleeding, death, stroke, paralysis, ongoing or worse pain, need for additional surgery, injury to the lumbar plexus resulting in hip flexor weakness and difficulty walking without assistive devices. Adjacent segment degenerative disease, need for additional surgery including fusing other levels, leak of spinal fluid, Nonunion, hardware failure, breakage, or mal-position. Deep venous thrombosis (DVT) requiring additional treatment such as filter, and/or medications. Injury to abdominal contents, loss in bowel and bladder control.  We will obtain preoperative medical clearance from the patient's primary care  provider.  I reviewed the patient's medication list with him. He is not on any blood thinners or aspirin. He is not taking any anti-inflammatory medications and I advised him to hold any anti-inflammatory medications leading up to surgery.  We have also discussed the post-operative recovery period to include: bathing/showering restrictions, wound healing, activity (and driving) restrictions, medications/pain mangement.  We have also discussed post-operative redflags to include: signs and symptoms of postoperative infection, DVT/PE.  Patient was fitted by physical therapy today for his LSO brace.  We need to review updated MRI prior to surgery which is scheduled for this afternoon.  Plan is to move forward with surgical intervention as described above pending results of upcoming MRI and preoperative testing at [facility]..

## 2020-03-23 ENCOUNTER — Other Ambulatory Visit: Payer: Self-pay

## 2020-03-23 ENCOUNTER — Encounter (HOSPITAL_COMMUNITY)
Admission: RE | Admit: 2020-03-23 | Discharge: 2020-03-23 | Disposition: A | Discharge status: Home / Self Care | Payer: PPO | Source: Ambulatory Visit | Attending: Orthopedic Surgery | Admitting: Orthopedic Surgery

## 2020-03-23 ENCOUNTER — Other Ambulatory Visit (HOSPITAL_COMMUNITY)
Admission: RE | Admit: 2020-03-23 | Discharge: 2020-03-23 | Disposition: A | Discharge status: Home / Self Care | Payer: PPO | Source: Ambulatory Visit | Attending: Orthopedic Surgery | Admitting: Orthopedic Surgery

## 2020-03-23 ENCOUNTER — Encounter (HOSPITAL_COMMUNITY): Payer: Self-pay

## 2020-03-23 DIAGNOSIS — Z20822 Contact with and (suspected) exposure to covid-19: Secondary | ICD-10-CM | POA: Diagnosis not present

## 2020-03-23 DIAGNOSIS — Z01818 Encounter for other preprocedural examination: Secondary | ICD-10-CM | POA: Diagnosis not present

## 2020-03-23 HISTORY — DX: Dyspnea, unspecified: R06.00

## 2020-03-23 HISTORY — DX: Diverticulitis of intestine, part unspecified, without perforation or abscess without bleeding: K57.92

## 2020-03-23 HISTORY — DX: Personal history of urinary calculi: Z87.442

## 2020-03-23 LAB — BASIC METABOLIC PANEL
Anion gap: 11 (ref 5–15)
BUN: 18 mg/dL (ref 8–23)
CO2: 24 mmol/L (ref 22–32)
Calcium: 9.3 mg/dL (ref 8.9–10.3)
Chloride: 105 mmol/L (ref 98–111)
Creatinine, Ser: 1.56 mg/dL — ABNORMAL HIGH (ref 0.61–1.24)
GFR calc Af Amer: 52 mL/min — ABNORMAL LOW (ref >60)
GFR calc non Af Amer: 45 mL/min — ABNORMAL LOW (ref >60)
Glucose, Bld: 111 mg/dL — ABNORMAL HIGH (ref 70–99)
Potassium: 4.5 mmol/L (ref 3.5–5.1)
Sodium: 140 mmol/L (ref 135–145)

## 2020-03-23 LAB — CBC
HCT: 41.2 % (ref 39.0–52.0)
Hemoglobin: 13.2 g/dL (ref 13.0–17.0)
MCH: 31.5 pg (ref 26.0–34.0)
MCHC: 32 g/dL (ref 30.0–36.0)
MCV: 98.3 fL (ref 80.0–100.0)
Platelets: 375 10*3/uL (ref 150–400)
RBC: 4.19 MIL/uL — ABNORMAL LOW (ref 4.22–5.81)
RDW: 13.2 % (ref 11.5–15.5)
WBC: 10 10*3/uL (ref 4.0–10.5)
nRBC: 0 % (ref 0.0–0.2)

## 2020-03-23 LAB — TYPE AND SCREEN
ABO/RH(D): O POS
Antibody Screen: NEGATIVE

## 2020-03-23 LAB — SARS CORONAVIRUS 2 (TAT 6-24 HRS): SARS Coronavirus 2: NEGATIVE

## 2020-03-23 LAB — PROTIME-INR
INR: 1 (ref 0.8–1.2)
Prothrombin Time: 13.2 seconds (ref 11.4–15.2)

## 2020-03-23 LAB — SURGICAL PCR SCREEN
MRSA, PCR: NEGATIVE
Staphylococcus aureus: POSITIVE — AB

## 2020-03-23 LAB — APTT: aPTT: 31 seconds (ref 24–36)

## 2020-03-23 NOTE — Pre-Procedure Instructions (Signed)
Taylor King PPJKDTO  03/23/2020    Your procedure is scheduled on Thursday, Mar 26, 2020 at 1:00 PM.   Report to [facility] Entrance "A" Admitting Office at 11:00 AM.   Call this number if you have problems the morning of surgery: [phone]   Questions prior to day of surgery, please call [phone] between 8 & 4 PM.   Remember:  Do not eat or drink after midnight Wednesday, 03/25/20.  Take these medicines the morning of surgery with A SIP OF WATER: Allopurinol (Zyloprim), Atenolol (Tenormin)  Do not use NSAIDS (Ibuprofen, Aleve, etc), Aspirin products (BC Powders, Goody's, etc), Multivitamins, Fish Oil or Herbal medications.    Do not wear jewelry.  Do not wear lotions, powders, cologne or deodorant.  Men may shave face and neck.  Do not bring valuables to the hospital.  [facility] is not responsible for any belongings or valuables.  Contacts, dentures or bridgework may not be worn into surgery.  Leave your suitcase in the car.  After surgery it may be brought to your room.  For patients admitted to the hospital, discharge time will be determined by your treatment team.  [facility] - Preparing for Surgery  Before surgery, you can play an important role.  Because skin is not sterile, your skin needs to be as free of germs as possible.  You can reduce the number of germs on you skin by washing with CHG (chlorahexidine gluconate) soap before surgery.  CHG is an antiseptic cleaner which kills germs and bonds with the skin to continue killing germs even after washing.  Oral Hygiene is also important in reducing the risk of infection.  Remember to brush your teeth with your regular toothpaste the morning of surgery.  Please DO NOT use if you have an allergy to CHG or antibacterial soaps.  If your skin becomes reddened/irritated stop using the CHG and inform your nurse when you arrive at Short Stay.  Do not shave (including legs and underarms) for at least 48 hours prior to  the first CHG shower.  You may shave your face.  Please follow these instructions carefully:   1.  Shower with CHG Soap the night before surgery and the morning of Surgery.  2.  If you choose to wash your hair, wash your hair first as usual with your normal shampoo.  3.  After you shampoo, rinse your hair and body thoroughly to remove the shampoo. 4.  Use CHG as you would any other liquid soap.  You can apply chg directly to the skin and wash gently with a      scrungie or washcloth.           5.  Apply the CHG Soap to your body ONLY FROM THE NECK DOWN.   Do not use on open wounds or open sores. Avoid contact with your eyes, ears, mouth and genitals (private parts).  Wash genitals (private parts) with your normal soap - do this prior to using CHG soap.  6.  Wash thoroughly, paying special attention to the area where your surgery will be performed.  7.  Thoroughly rinse your body with warm water from the neck down.  8.  DO NOT shower/wash with your normal soap after using and rinsing off the CHG Soap.  9.  Pat yourself dry with a clean towel.            10.  Wear clean pajamas.  11.  Place clean sheets on your bed the night of your first shower and do not sleep with pets.  Day of Surgery  Shower as above. Do not apply any lotions/deodorants the morning of surgery.   Please wear clean clothes to the hospital. Remember to brush your teeth with toothpaste.   Please read over the fact sheets that you were given.

## 2020-03-23 NOTE — Progress Notes (Signed)
PCP - Dr. Delfina Redwood  Chest x-ray - today EKG - 03/23/20 Stress Test - denies ECHO - denies Cardiac Cath - denies  COVID TEST- today   Anesthesia review: yes, hx of AAA (Dr. Delfina Redwood follows)  Recently had MRI of orbits due to headaches and losing vision. Results in [place] and pt states headaches are gone. He saw Blair Heys in [place] for his eyes.  Patient denies shortness of breath, fever, cough and chest pain at PAT appointment   All instructions explained to the patient, with a verbal understanding of the material. Patient agrees to go over the instructions while at home for a better understanding. Patient also instructed to self quarantine after being tested for COVID-19. The opportunity to ask questions was provided.

## 2020-03-24 NOTE — Anesthesia Preprocedure Evaluation (Addendum)
Anesthesia Evaluation  Patient identified by MRN, date of birth, ID band Patient awake    Reviewed: Allergy & Precautions, NPO status , Patient's Chart, lab work & pertinent test results, reviewed documented beta blocker date and time   History of Anesthesia Complications Negative for: history of anesthetic complications  Airway Mallampati: I  TM Distance: >3 FB Neck ROM: Full    Dental  (+) Edentulous Upper, Edentulous Lower   Pulmonary former smoker,  03/23/2020 SARS coronavirus NEG   breath sounds clear to auscultation       Cardiovascular hypertension, Pt. on medications and Pt. on home beta blockers (-) angina Rhythm:Regular Rate:Normal     Neuro/Psych  Headaches, Chronic back pain    GI/Hepatic Neg liver ROS, GERD  Controlled,  Endo/Other  negative endocrine ROS  Renal/GU Renal InsufficiencyRenal disease (creat 1.56)     Musculoskeletal  (+) Arthritis ,   Abdominal   Peds  Hematology negative hematology ROS (+)   Anesthesia Other Findings   Reproductive/Obstetrics                           Anesthesia Physical Anesthesia Plan  ASA: II  Anesthesia Plan: General   Post-op Pain Management: GA combined w/ Regional for post-op pain   Induction: Intravenous  PONV Risk Score and Plan: 3 and Scopolamine patch - Pre-op, Dexamethasone and Ondansetron  Airway Management Planned: Oral ETT  Additional Equipment: None  Intra-op Plan:   Post-operative Plan: Extubation in OR  Informed Consent: I have reviewed the patients History and Physical, chart, labs and discussed the procedure including the risks, benefits and alternatives for the proposed anesthesia with the patient or authorized representative who has indicated his/her understanding and acceptance.     Dental advisory given  Plan Discussed with: CRNA and Surgeon  Anesthesia Plan Comments: (Preop clearance from pt PCP Dr.  Delfina Redwood 02/27/20 states pt is cleared for surgery with no additional recommendations. Copy on chart.   Plan routine monitors, GETA with TAP block for post op analgesia)      Anesthesia Quick Evaluation

## 2020-03-26 ENCOUNTER — Ambulatory Visit (HOSPITAL_COMMUNITY)
Admission: RE | Disposition: A | Discharge status: Home / Self Care | Payer: Self-pay | Source: Ambulatory Visit | Attending: Orthopedic Surgery

## 2020-03-26 ENCOUNTER — Ambulatory Visit (HOSPITAL_COMMUNITY): Payer: PPO

## 2020-03-26 ENCOUNTER — Encounter (HOSPITAL_COMMUNITY): Payer: Self-pay | Admitting: Orthopedic Surgery

## 2020-03-26 ENCOUNTER — Ambulatory Visit (HOSPITAL_COMMUNITY): Payer: PPO | Admitting: Certified Registered"

## 2020-03-26 ENCOUNTER — Observation Stay (HOSPITAL_COMMUNITY)
Admission: RE | Admit: 2020-03-26 | Discharge: 2020-03-27 | Disposition: A | Discharge status: Home / Self Care | Payer: PPO | Source: Ambulatory Visit | Attending: Orthopedic Surgery | Admitting: Orthopedic Surgery

## 2020-03-26 ENCOUNTER — Other Ambulatory Visit: Payer: Self-pay

## 2020-03-26 ENCOUNTER — Ambulatory Visit (HOSPITAL_COMMUNITY): Payer: PPO | Admitting: Physician Assistant

## 2020-03-26 DIAGNOSIS — I739 Peripheral vascular disease, unspecified: Secondary | ICD-10-CM | POA: Insufficient documentation

## 2020-03-26 DIAGNOSIS — M48061 Spinal stenosis, lumbar region without neurogenic claudication: Principal | ICD-10-CM | POA: Diagnosis present

## 2020-03-26 DIAGNOSIS — Z79899 Other long term (current) drug therapy: Secondary | ICD-10-CM | POA: Diagnosis not present

## 2020-03-26 DIAGNOSIS — M4326 Fusion of spine, lumbar region: Secondary | ICD-10-CM | POA: Diagnosis not present

## 2020-03-26 DIAGNOSIS — Z87891 Personal history of nicotine dependence: Secondary | ICD-10-CM | POA: Diagnosis not present

## 2020-03-26 DIAGNOSIS — M5116 Intervertebral disc disorders with radiculopathy, lumbar region: Secondary | ICD-10-CM | POA: Diagnosis not present

## 2020-03-26 DIAGNOSIS — I10 Essential (primary) hypertension: Secondary | ICD-10-CM | POA: Insufficient documentation

## 2020-03-26 DIAGNOSIS — K219 Gastro-esophageal reflux disease without esophagitis: Secondary | ICD-10-CM | POA: Diagnosis not present

## 2020-03-26 DIAGNOSIS — Z96652 Presence of left artificial knee joint: Secondary | ICD-10-CM | POA: Diagnosis not present

## 2020-03-26 DIAGNOSIS — R195 Other fecal abnormalities: Secondary | ICD-10-CM | POA: Diagnosis not present

## 2020-03-26 DIAGNOSIS — Z419 Encounter for procedure for purposes other than remedying health state, unspecified: Secondary | ICD-10-CM

## 2020-03-26 HISTORY — PX: ANTERIOR LAT LUMBAR FUSION: SHX1168

## 2020-03-26 LAB — URINALYSIS, ROUTINE W REFLEX MICROSCOPIC
Bacteria, UA: NONE SEEN
Bilirubin Urine: NEGATIVE
Glucose, UA: NEGATIVE mg/dL
Ketones, ur: NEGATIVE mg/dL
Leukocytes,Ua: NEGATIVE
Nitrite: NEGATIVE
Protein, ur: 100 mg/dL — AB
Specific Gravity, Urine: 1.018 (ref 1.005–1.030)
pH: 5 (ref 5.0–8.0)

## 2020-03-26 SURGERY — ANTERIOR LATERAL LUMBAR FUSION 1 LEVEL
Anesthesia: General | Site: Back

## 2020-03-26 MED ORDER — SODIUM CHLORIDE 0.9 % IV SOLN: 250.0000 mL | INTRAVENOUS | Status: DC

## 2020-03-26 MED ORDER — SCOPOLAMINE 1 MG/3DAYS TD PT72: 1.0000 | MEDICATED_PATCH | TRANSDERMAL | Status: DC

## 2020-03-26 MED ORDER — FENTANYL CITRATE (PF) 250 MCG/5ML IJ SOLN
INTRAMUSCULAR | Status: DC | PRN
  Administered 2020-03-26: 250 ug via INTRAVENOUS

## 2020-03-26 MED ORDER — ONDANSETRON HCL 4 MG PO TABS: 4.0000 mg | ORAL_TABLET | Freq: Four times a day (QID) | ORAL | Status: DC | PRN

## 2020-03-26 MED ORDER — MIDAZOLAM HCL 2 MG/2ML IJ SOLN: 0.5000 mg | Freq: Once | INTRAMUSCULAR | Status: DC | PRN

## 2020-03-26 MED ORDER — 0.9 % SODIUM CHLORIDE (POUR BTL) OPTIME
TOPICAL | Status: DC | PRN
  Administered 2020-03-26 (×3): 1000 mL

## 2020-03-26 MED ORDER — ONDANSETRON HCL 4 MG/2ML IJ SOLN
INTRAMUSCULAR | Status: DC | PRN
  Administered 2020-03-26: 4 mg via INTRAVENOUS

## 2020-03-26 MED ORDER — ACETAMINOPHEN 500 MG PO TABS: 1000.0000 mg | ORAL_TABLET | Freq: Once | ORAL | Status: AC

## 2020-03-26 MED ORDER — MIDAZOLAM HCL 2 MG/2ML IJ SOLN
INTRAMUSCULAR | Status: AC
  Filled 2020-03-26: qty 2

## 2020-03-26 MED ORDER — SODIUM CHLORIDE 0.9% FLUSH: 3.0000 mL | INTRAVENOUS | Status: DC | PRN

## 2020-03-26 MED ORDER — GABAPENTIN 300 MG PO CAPS
300.0000 mg | ORAL_CAPSULE | Freq: Three times a day (TID) | ORAL | Status: DC
  Administered 2020-03-26 – 2020-03-27 (×2): 300 mg via ORAL
  Filled 2020-03-26 (×2): qty 1

## 2020-03-26 MED ORDER — PHENOL 1.4 % MT LIQD: 1.0000 | OROMUCOSAL | Status: DC | PRN

## 2020-03-26 MED ORDER — EPINEPHRINE PF 1 MG/ML IJ SOLN
INTRAMUSCULAR | Status: AC
  Filled 2020-03-26: qty 1

## 2020-03-26 MED ORDER — POLYETHYLENE GLYCOL 3350 17 G PO PACK: 17.0000 g | PACK | Freq: Every day | ORAL | Status: DC | PRN

## 2020-03-26 MED ORDER — MENTHOL 3 MG MT LOZG: 1.0000 | LOZENGE | OROMUCOSAL | Status: DC | PRN

## 2020-03-26 MED ORDER — HYDROMORPHONE HCL 1 MG/ML IJ SOLN
INTRAMUSCULAR | Status: AC
  Filled 2020-03-26: qty 1

## 2020-03-26 MED ORDER — MIDAZOLAM HCL 5 MG/5ML IJ SOLN
INTRAMUSCULAR | Status: DC | PRN
  Administered 2020-03-26: 2 mg via INTRAVENOUS

## 2020-03-26 MED ORDER — METHOCARBAMOL 500 MG PO TABS
500.0000 mg | ORAL_TABLET | Freq: Four times a day (QID) | ORAL | Status: DC | PRN
  Administered 2020-03-26 – 2020-03-27 (×2): 500 mg via ORAL
  Filled 2020-03-26 (×2): qty 1

## 2020-03-26 MED ORDER — BUPIVACAINE LIPOSOME 1.3 % IJ SUSP
INTRAMUSCULAR | Status: DC | PRN
  Administered 2020-03-26: 20 mL

## 2020-03-26 MED ORDER — DOCUSATE SODIUM 100 MG PO CAPS
100.0000 mg | ORAL_CAPSULE | Freq: Two times a day (BID) | ORAL | Status: DC
  Administered 2020-03-26 – 2020-03-27 (×2): 100 mg via ORAL
  Filled 2020-03-26 (×2): qty 1

## 2020-03-26 MED ORDER — LACTATED RINGERS IV SOLN: INTRAVENOUS | Status: DC

## 2020-03-26 MED ORDER — CEFAZOLIN SODIUM-DEXTROSE 1-4 GM/50ML-% IV SOLN
1.0000 g | Freq: Three times a day (TID) | INTRAVENOUS | Status: AC
  Administered 2020-03-26 – 2020-03-27 (×2): 1 g via INTRAVENOUS
  Filled 2020-03-26 (×2): qty 50

## 2020-03-26 MED ORDER — ONDANSETRON HCL 4 MG/2ML IJ SOLN
4.0000 mg | Freq: Four times a day (QID) | INTRAMUSCULAR | Status: DC | PRN
  Filled 2020-03-26: qty 2

## 2020-03-26 MED ORDER — LIDOCAINE 2% (20 MG/ML) 5 ML SYRINGE
INTRAMUSCULAR | Status: DC | PRN
  Administered 2020-03-26: 20 mg via INTRAVENOUS

## 2020-03-26 MED ORDER — BUPIVACAINE LIPOSOME 1.3 % IJ SUSP
20.0000 mL | Freq: Once | INTRAMUSCULAR | Status: DC
  Filled 2020-03-26: qty 20

## 2020-03-26 MED ORDER — ALLOPURINOL 300 MG PO TABS
300.0000 mg | ORAL_TABLET | Freq: Every day | ORAL | Status: DC
  Administered 2020-03-27: 300 mg via ORAL
  Filled 2020-03-26: qty 1

## 2020-03-26 MED ORDER — THROMBIN (RECOMBINANT) 20000 UNITS EX SOLR
CUTANEOUS | Status: AC
  Filled 2020-03-26: qty 20000

## 2020-03-26 MED ORDER — MEPERIDINE HCL 25 MG/ML IJ SOLN: 6.2500 mg | INTRAMUSCULAR | Status: DC | PRN

## 2020-03-26 MED ORDER — PHENYLEPHRINE HCL-NACL 10-0.9 MG/250ML-% IV SOLN
INTRAVENOUS | Status: DC | PRN
  Administered 2020-03-26: 75 ug/min via INTRAVENOUS

## 2020-03-26 MED ORDER — METHOCARBAMOL 1000 MG/10ML IJ SOLN
500.0000 mg | Freq: Four times a day (QID) | INTRAVENOUS | Status: DC | PRN
  Filled 2020-03-26: qty 5

## 2020-03-26 MED ORDER — SCOPOLAMINE 1 MG/3DAYS TD PT72
MEDICATED_PATCH | TRANSDERMAL | Status: AC
  Administered 2020-03-26: 1.5 mg via TRANSDERMAL
  Filled 2020-03-26: qty 1

## 2020-03-26 MED ORDER — BUPIVACAINE HCL (PF) 0.25 % IJ SOLN
INTRAMUSCULAR | Status: AC
  Filled 2020-03-26: qty 30

## 2020-03-26 MED ORDER — OXYCODONE-ACETAMINOPHEN 10-325 MG PO TABS: 1.0000 | ORAL_TABLET | Freq: Four times a day (QID) | ORAL | 0 refills | Status: AC | PRN

## 2020-03-26 MED ORDER — ACETAMINOPHEN 650 MG RE SUPP: 650.0000 mg | RECTAL | Status: DC | PRN

## 2020-03-26 MED ORDER — PROMETHAZINE HCL 25 MG/ML IJ SOLN: 6.2500 mg | INTRAMUSCULAR | Status: DC | PRN

## 2020-03-26 MED ORDER — DEXAMETHASONE SODIUM PHOSPHATE 10 MG/ML IJ SOLN
INTRAMUSCULAR | Status: DC | PRN
  Administered 2020-03-26: 4 mg via INTRAVENOUS

## 2020-03-26 MED ORDER — FENTANYL CITRATE (PF) 100 MCG/2ML IJ SOLN
INTRAMUSCULAR | Status: AC
  Filled 2020-03-26: qty 2

## 2020-03-26 MED ORDER — HEMOSTATIC AGENTS (NO CHARGE) OPTIME
TOPICAL | Status: DC | PRN
  Administered 2020-03-26: 1

## 2020-03-26 MED ORDER — BUPIVACAINE HCL (PF) 0.25 % IJ SOLN
INTRAMUSCULAR | Status: DC | PRN
  Administered 2020-03-26: 20 mL

## 2020-03-26 MED ORDER — FENTANYL CITRATE (PF) 250 MCG/5ML IJ SOLN
INTRAMUSCULAR | Status: AC
  Filled 2020-03-26: qty 5

## 2020-03-26 MED ORDER — EPINEPHRINE PF 1 MG/ML IJ SOLN
INTRAMUSCULAR | Status: DC | PRN
  Administered 2020-03-26: .15 mL

## 2020-03-26 MED ORDER — CEFAZOLIN SODIUM-DEXTROSE 2-4 GM/100ML-% IV SOLN
2.0000 g | INTRAVENOUS | Status: AC
  Administered 2020-03-26: 2 g via INTRAVENOUS

## 2020-03-26 MED ORDER — ACETAMINOPHEN 500 MG PO TABS
ORAL_TABLET | ORAL | Status: AC
  Administered 2020-03-26: 1000 mg via ORAL
  Filled 2020-03-26: qty 2

## 2020-03-26 MED ORDER — OXYCODONE HCL 5 MG PO TABS
10.0000 mg | ORAL_TABLET | ORAL | Status: DC | PRN
  Administered 2020-03-26 – 2020-03-27 (×2): 10 mg via ORAL
  Filled 2020-03-26 (×2): qty 2

## 2020-03-26 MED ORDER — ATENOLOL 25 MG PO TABS
12.5000 mg | ORAL_TABLET | Freq: Every day | ORAL | Status: DC
  Filled 2020-03-26: qty 1

## 2020-03-26 MED ORDER — METHOCARBAMOL 500 MG PO TABS: 500.0000 mg | ORAL_TABLET | Freq: Three times a day (TID) | ORAL | 0 refills | Status: AC | PRN

## 2020-03-26 MED ORDER — PROPOFOL 10 MG/ML IV BOLUS
INTRAVENOUS | Status: AC
  Filled 2020-03-26: qty 20

## 2020-03-26 MED ORDER — SODIUM CHLORIDE 0.9% FLUSH: 3.0000 mL | Freq: Two times a day (BID) | INTRAVENOUS | Status: DC

## 2020-03-26 MED ORDER — PROPOFOL 10 MG/ML IV BOLUS
INTRAVENOUS | Status: DC | PRN
  Administered 2020-03-26: 150 mg via INTRAVENOUS

## 2020-03-26 MED ORDER — FLEET ENEMA 7-19 GM/118ML RE ENEM: 1.0000 | ENEMA | Freq: Once | RECTAL | Status: DC | PRN

## 2020-03-26 MED ORDER — ACETAMINOPHEN 325 MG PO TABS
650.0000 mg | ORAL_TABLET | ORAL | Status: DC | PRN
  Administered 2020-03-26 – 2020-03-27 (×3): 650 mg via ORAL
  Filled 2020-03-26 (×3): qty 2

## 2020-03-26 MED ORDER — SUCCINYLCHOLINE CHLORIDE 200 MG/10ML IV SOSY
PREFILLED_SYRINGE | INTRAVENOUS | Status: DC | PRN
Start: 2020-03-26 — End: 2020-03-26
  Administered 2020-03-26: 140 mg via INTRAVENOUS

## 2020-03-26 MED ORDER — HYDROMORPHONE HCL 1 MG/ML IJ SOLN: 1.0000 mg | INTRAMUSCULAR | Status: DC | PRN

## 2020-03-26 MED ORDER — CEFAZOLIN SODIUM-DEXTROSE 2-4 GM/100ML-% IV SOLN
INTRAVENOUS | Status: AC
  Filled 2020-03-26: qty 100

## 2020-03-26 MED ORDER — PHENYLEPHRINE 40 MCG/ML (10ML) SYRINGE FOR IV PUSH (FOR BLOOD PRESSURE SUPPORT)
PREFILLED_SYRINGE | INTRAVENOUS | Status: DC | PRN
  Administered 2020-03-26 (×2): 80 ug via INTRAVENOUS
  Administered 2020-03-26: 120 ug via INTRAVENOUS

## 2020-03-26 MED ORDER — OXYCODONE HCL 5 MG PO TABS
5.0000 mg | ORAL_TABLET | ORAL | Status: DC | PRN
  Administered 2020-03-27: 5 mg via ORAL
  Filled 2020-03-26: qty 1

## 2020-03-26 MED ORDER — HYDROMORPHONE HCL 1 MG/ML IJ SOLN
0.2500 mg | INTRAMUSCULAR | Status: DC | PRN
  Administered 2020-03-26 (×3): 0.5 mg via INTRAVENOUS

## 2020-03-26 MED ORDER — ONDANSETRON HCL 4 MG PO TABS: 4.0000 mg | ORAL_TABLET | Freq: Three times a day (TID) | ORAL | 0 refills | Status: DC | PRN

## 2020-03-26 MED ORDER — PROPOFOL 500 MG/50ML IV EMUL
INTRAVENOUS | Status: DC | PRN
Start: 2020-03-26 — End: 2020-03-26
  Administered 2020-03-26: 50 ug/kg/min via INTRAVENOUS

## 2020-03-26 MED ORDER — EPHEDRINE SULFATE-NACL 50-0.9 MG/10ML-% IV SOSY
PREFILLED_SYRINGE | INTRAVENOUS | Status: DC | PRN
  Administered 2020-03-26 (×2): 10 mg via INTRAVENOUS
  Administered 2020-03-26: 5 mg via INTRAVENOUS
  Administered 2020-03-26: 10 mg via INTRAVENOUS

## 2020-03-26 MED ORDER — SUCCINYLCHOLINE CHLORIDE 200 MG/10ML IV SOSY
PREFILLED_SYRINGE | INTRAVENOUS | Status: AC
  Filled 2020-03-26: qty 10

## 2020-03-26 SURGICAL SUPPLY — 68 items
BLADE CLIPPER SURG (BLADE) IMPLANT
BLADE SURG 10 STRL SS (BLADE) ×2 IMPLANT
BONE MATRIX OSTEOCEL PRO MED (Bone Implant) ×1 IMPLANT
CAGE MODULUS XL 12X18X50 - 10 (Cage) ×1 IMPLANT
CATH FOLEY 2WAY SLVR  5CC 16FR (CATHETERS) ×2
CATH FOLEY 2WAY SLVR 5CC 16FR (CATHETERS) ×1 IMPLANT
CLSR STERI-STRIP ANTIMIC 1/2X4 (GAUZE/BANDAGES/DRESSINGS) ×1 IMPLANT
COVER SURGICAL LIGHT HANDLE (MISCELLANEOUS) ×2 IMPLANT
COVER WAND RF STERILE (DRAPES) ×2 IMPLANT
DRAPE C-ARM 42X72 X-RAY (DRAPES) ×2 IMPLANT
DRAPE C-ARMOR (DRAPES) ×2 IMPLANT
DRAPE INCISE IOBAN 66X45 STRL (DRAPES) IMPLANT
DRSG OPSITE POSTOP 3X4 (GAUZE/BANDAGES/DRESSINGS) ×1 IMPLANT
DRSG OPSITE POSTOP 4X6 (GAUZE/BANDAGES/DRESSINGS) ×3 IMPLANT
DURAPREP 26ML APPLICATOR (WOUND CARE) ×2 IMPLANT
ELECT BLADE 4.0 EZ CLEAN MEGAD (MISCELLANEOUS) ×2
ELECT PENCIL ROCKER SW 15FT (MISCELLANEOUS) ×2 IMPLANT
ELECT REM PT RETURN 9FT ADLT (ELECTROSURGICAL) ×2
ELECTRODE BLDE 4.0 EZ CLN MEGD (MISCELLANEOUS) ×1 IMPLANT
ELECTRODE REM PT RTRN 9FT ADLT (ELECTROSURGICAL) ×1 IMPLANT
GLOVE BIO SURGEON STRL SZ 6.5 (GLOVE) ×2 IMPLANT
GLOVE BIOGEL PI IND STRL 6.5 (GLOVE) ×1 IMPLANT
GLOVE BIOGEL PI IND STRL 8.5 (GLOVE) ×1 IMPLANT
GLOVE BIOGEL PI INDICATOR 6.5 (GLOVE) ×1
GLOVE BIOGEL PI INDICATOR 8.5 (GLOVE) ×1
GLOVE SS BIOGEL STRL SZ 8.5 (GLOVE) ×1 IMPLANT
GLOVE SUPERSENSE BIOGEL SZ 8.5 (GLOVE) ×1
GOWN STRL REUS W/ TWL LRG LVL3 (GOWN DISPOSABLE) ×2 IMPLANT
GOWN STRL REUS W/ TWL XL LVL3 (GOWN DISPOSABLE) ×2 IMPLANT
GOWN STRL REUS W/TWL 2XL LVL3 (GOWN DISPOSABLE) ×4 IMPLANT
GOWN STRL REUS W/TWL LRG LVL3 (GOWN DISPOSABLE) ×4
GOWN STRL REUS W/TWL XL LVL3 (GOWN DISPOSABLE) ×4
GUIDEWIRE NITINOL BEVEL TIP (WIRE) ×4 IMPLANT
KIT BASIN OR (CUSTOM PROCEDURE TRAY) ×2 IMPLANT
KIT DILATOR XLIF 5 (KITS) ×1 IMPLANT
KIT SURGICAL ACCESS MAXCESS 4 (KITS) ×1 IMPLANT
KIT TURNOVER KIT B (KITS) ×2 IMPLANT
MODULE EMG NDL SSEP NVM5 (NEEDLE) IMPLANT
MODULE EMG NEEDLE SSEP NVM5 (NEEDLE) ×2 IMPLANT
MODULE NVM5 NEXT GEN EMG (NEEDLE) ×1 IMPLANT
NDL I-PASS III (NEEDLE) IMPLANT
NDL SPNL 18GX3.5 QUINCKE PK (NEEDLE) ×1 IMPLANT
NEEDLE 22X1 1/2 (OR ONLY) (NEEDLE) ×2 IMPLANT
NEEDLE I-PASS III (NEEDLE) ×2 IMPLANT
NEEDLE SPNL 18GX3.5 QUINCKE PK (NEEDLE) ×2 IMPLANT
NS IRRIG 1000ML POUR BTL (IV SOLUTION) ×2 IMPLANT
PACK LAMINECTOMY ORTHO (CUSTOM PROCEDURE TRAY) ×2 IMPLANT
PACK UNIVERSAL I (CUSTOM PROCEDURE TRAY) ×2 IMPLANT
PAD ARMBOARD 7.5X6 YLW CONV (MISCELLANEOUS) ×4 IMPLANT
ROD RELINE MAS LORD 5.5X45MM (Rod) ×1 IMPLANT
ROD RELINE MAS LORD 5.5X50 (Rod) ×1 IMPLANT
SCREW LOCK RELINE 5.5 TULIP (Screw) ×4 IMPLANT
SCREW RELINE RED 6.5X45MM POLY (Screw) ×4 IMPLANT
SPONGE LAP 4X18 RFD (DISPOSABLE) ×2 IMPLANT
SPONGE SURGIFOAM ABS GEL 100 (HEMOSTASIS) ×2 IMPLANT
STAPLER VISISTAT 35W (STAPLE) ×2 IMPLANT
SURGIFLO W/THROMBIN 8M KIT (HEMOSTASIS) ×1 IMPLANT
SUT BONE WAX W31G (SUTURE) IMPLANT
SUT MNCRL AB 3-0 PS2 27 (SUTURE) ×4 IMPLANT
SUT VIC AB 1 CT1 18XCR BRD 8 (SUTURE) ×2 IMPLANT
SUT VIC AB 1 CT1 8-18 (SUTURE) ×4
SUT VIC AB 2-0 CT1 18 (SUTURE) ×5 IMPLANT
SYR BULB IRRIG 60ML STRL (SYRINGE) ×2 IMPLANT
TAPE CLOTH 4X10 WHT NS (GAUZE/BANDAGES/DRESSINGS) ×2 IMPLANT
THROMBIN 20,000 UNITS IMPLANT
TOWEL GREEN STERILE (TOWEL DISPOSABLE) ×2 IMPLANT
TOWEL GREEN STERILE FF (TOWEL DISPOSABLE) ×2 IMPLANT
WATER STERILE IRR 1000ML POUR (IV SOLUTION) ×2 IMPLANT

## 2020-03-26 NOTE — Op Note (Signed)
Operative report.  Preoperative diagnosis: L2-3 degenerative lumbar disc disease with foraminal stenosis and radicular leg pain.  Postoperative diagnosis: Same  Operative procedure: Lateral lumbar interbody fusion L2-3.  Posterior pedicle screw fixation L2-3  First Assistant: Cleta Alberts, PA  Complications: None  Intraoperative neuro monitoring: No abnormal SSEP or EMG activity noted.  All 4 pedicle screws were directly stimulated and there was no adverse activity at greater than 40 mA.  Implants: NuVasive 3D titanium intervertebral cage.  12 x 18 x 50.  10 degree.  Pedicle screws: 6.5 x 45 mm.  45 mm length rod.  Allograft:osteocell  Indications: This is a very pleasant 69 year old woman with longstanding significant back buttock and radicular leg pain.  Attempts at conservative management failed to alleviate his symptoms.  An L2 selective nerve root block did provide temporary significant improvement in his pain and quality of life.  After localizing the pain generator to the collapsed degenerated L2-3 disc space with foraminal and central stenosis we elected to move forward with surgery.  All appropriate risks benefits and alternatives were discussed with the patient and consent was obtained.  Operative report: Patient was brought the operating room placed supine the operating room table.  After successful induction of general anesthesia endotracheal ovation teds SCDs and Foley were inserted.  Timeout was taken to confirm patient procedure and all important data.  The patient was then turned into the lateral decubitus position left side up.  Axillary roll was placed and all bony prominences were well-padded.  The left upper extremity was placed in a well arm holder and then using fluoroscopy I made sure the patient was properly positioned for visualization of the L2 disc space in both the AP and lateral planes.  Patient was secured to the bed with tape to prevent motion and changing of the  imaging studies.  The posterior lumbar spine as well as the left flank were prepped and draped in a standard fashion.  Timeout was taken to confirm patient procedure and all other important data once again.  Identified the lateral borders of the L2-3 disc space and infiltrated the incision with quarter percent Marcaine with epinephrine.  Transverse incision was made sharp dissection was carried out down to the fascia of the external oblique.  A secondary incision was made 1 fingerbreadth posteriorly to this and I bluntly dissected down to the posterior aspect of the retroperitoneal space I entered into the retroperitoneal space and bluntly dissected inside mobilized this area I then dissected under the surface of the external oblique until I could see my finger in the first incision.  I then guided the first dilating tube to the surface of the psoas muscle and stimulated confirming I was not traumatizing the plexus.  Once I confirmed I was at the midportion of the disc space advanced through the psoas to the lateral aspect of the annulus.  A guidepin was used to secure it.  I then circumferentially stimulated to confirm satisfactory position.  I then sequentially dilated and with each 1 stimulated in a circumferential manner to ensure was not traumatizing the lumbar plexus.  After the final dilator was placed I placed the working trocar/retractor and removed the dilating tubes.  I then gently mobilized the retractor blade posteriorly making sure to keep a good cuff of psoas to protect the plexus.  Once I was near the posterior third of the disc space I locked it in place.  I confirmed position with fluoroscopy.  I then directly stimulated behind  the posterior blade as well as inferior superior and anterior to confirm I not traumatized the plexus or in advertently compressed it.  Once this was confirmed the shim was used to secure the posterior blade into the disc space.  I then had excellent visualization of the  lateral aspect of the L2-3 disc space.  Annulotomy was performed with a 10 blade scalpel and then I used a Cobb elevator to transverse the endplates of L2 and L3 and released the contralateral annulus.  A box osteotome was then placed and then I began sequentially dilating starting at a size 6 dilator.  I then proceeded up to the size 12 lordotic spacer.  This provided the best overall distraction of the intervertebral space providing the best indirect foraminal decompression.  It was well secured.  I then remove the trialing device and then used additional curettes and rasps to remove all the cartilaginous endplate of the disc and expose the bleeding subchondral bone.  At this point I irrigated the intervertebral space copiously normal saline and then obtained the size 12 lordotic spacer.  This was packed with the allograft and then malleted to the appropriate depth.  Imaging studies confirm satisfactory overall placement of the cage in the intervertebral space.  It was well secured and there is no evidence to suggest the ALL  had been disrupted.  Both wounds were copiously irrigated with normal saline and closed in a layered fashion with interrupted #1 Vicryl suture, 2-0 Vicryl suture, and 3-0 Monocryl.  I then used Exparel in the lateral incision to obtain improved postoperative analgesia.  With the patient remaining in the lateral position the bed was returned to a neutral position and then I proceeded to place the pedicle screws.  Identified the lateral border of the L2 pedicle made a small incision and advanced the Jamshidi needle down to the lateral aspect of the pedicle.  I then advanced the Jamshidi needle into the pedicle of L2.  Using fluoroscopy to guide me as well as direct neural stimulation the Jamshidi needle I confirmed satisfactory trajectory and position.  As I neared the medial wall of the pedicle I flipped into the lateral view to confirm that I was beyond the posterior wall.  I then  advanced into the vertebral body and then placed the guidepin to cannulate the pedicle.  I repeated this exact same procedure at L3 and on the contralateral side at L2 and L3.  Once all 4 pedicles were cannulated I then measured and then placed the appropriate size pedicle screw.  All 4 screws had excellent purchase and during the implantation there was no adverse free running EMG activity.  Once all 4 pedicle screws were placed I then directly stimulated each of the 4 pedicle screws and there was no adverse activity at greater than 40 mA.  I then measured and placed the appropriate size rod across the pedicle screw construct.  The locking nuts were placed to secure the rod.  I then torqued the locking nuts according manufacture standards to ensure that the construct was secured.  Once this was complete I remove the inserting tabs and irrigated the additional 4 wounds with normal saline.  These 4 wounds were also closed in a layered fashion with interrupted #1 Vicryl suture, 2-0 Vicryl suture, and a 3-0 Monocryl.  Final x-rays were taken which demonstrated satisfactory overall positioning of the hardware at the L2-3 level.  Steri-Strips and dry dressings were applied and the patient was ultimately extubated transfer the  PACU without incident.  The end of the case all needle sponge counts were correct.  There were no adverse intraoperative events.

## 2020-03-26 NOTE — Brief Op Note (Signed)
03/26/2020  3:15 PM  PATIENT:  Taylor King  69 y.o. 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  PROCEDURE:  Procedure(s) with comments: ANTERIOR LATERAL LUMBAR FUSION (XLIF) LUMBAR TWO-THREE WITH POSTERIOR SPINAL FUSION INTERBODY LUMBAR TWO-THREE (N/A) - 4 hrs Left tap block with exparel  SURGEON:  Surgeon(s) and Role:    Melina Schools, MD - Primary  PHYSICIAN ASSISTANT:   ASSISTANTS: amanda ward, PA   ANESTHESIA:   general  EBL:  100 mL   BLOOD ADMINISTERED:none  DRAINS: none   LOCAL MEDICATIONS USED:  MARCAINE    and OTHER exparel  SPECIMEN:  No Specimen  DISPOSITION OF SPECIMEN:  N/A  COUNTS:  YES  TOURNIQUET:  * No tourniquets in log *  DICTATION: .Dragon Dictation  PLAN OF CARE: Admit to inpatient   PATIENT DISPOSITION:  PACU - hemodynamically stable.

## 2020-03-26 NOTE — Transfer of Care (Signed)
Immediate Anesthesia Transfer of Care Note  Patient: Taylor King  Procedure(s) Performed: ANTERIOR LATERAL LUMBAR FUSION (XLIF) LUMBAR TWO-THREE WITH POSTERIOR SPINAL FUSION INTERBODY LUMBAR TWO-THREE (N/A Back)  Patient Location: PACU  Anesthesia Type:General  Level of Consciousness: drowsy and patient cooperative  Airway & Oxygen Therapy: Patient Spontanous Breathing and Patient connected to nasal cannula oxygen  Post-op Assessment: Report given to RN and Post -op Vital signs reviewed and stable  Post vital signs: Reviewed and stable  Last Vitals:  Vitals Value Taken Time  BP 118/69 03/26/20 1604  Temp    Pulse 82 03/26/20 1608  Resp 18 03/26/20 1608  SpO2 98 % 03/26/20 1608  Vitals shown include unvalidated device data.  Last Pain:  Vitals:   03/26/20 1120  PainSc: 0-No pain      Patients Stated Pain Goal: 2 (78/93/81 0175)  Complications: No apparent anesthesia complications

## 2020-03-26 NOTE — OR Nursing (Signed)
PER PROTOCOL DR.WOODRUFF CALLED AT 15:35 ON 03/26/2020 AND CONFIRMED PATIENT WAS CLEAR OF ANY FOREIGN OBJECTS.

## 2020-03-26 NOTE — Discharge Instructions (Signed)

## 2020-03-26 NOTE — H&P (Addendum)
Addendum H+P: No change in clinical exam since last office visit of 03/20/20 No change in clinical exam or imaging studies.  Updated lumbar MRI completed on 03/20/20 demonstrates ongoing ddd with stenosis at L2/3. Similar to prior MRI. Plan on moving forward with planned XLIF L2/3 with PSFI All of the patient questions and concerns were addressed.  Risks& benefits reviewed with patient.

## 2020-03-26 NOTE — Anesthesia Procedure Notes (Signed)
Procedure Name: Intubation Date/Time: 03/26/2020 12:20 PM Performed by: Myna Bright, CRNA Pre-anesthesia Checklist: Patient identified, Emergency Drugs available, Suction available and Patient being monitored Patient Re-evaluated:Patient Re-evaluated prior to induction Oxygen Delivery Method: Circle system utilized Preoxygenation: Pre-oxygenation with 100% oxygen Induction Type: IV induction Ventilation: Mask ventilation without difficulty Laryngoscope Size: Mac and 4 Grade View: Grade I Tube type: Oral Tube size: 7.5 mm Number of attempts: 1 Airway Equipment and Method: Stylet Placement Confirmation: ETT inserted through vocal cords under direct vision,  positive ETCO2 and breath sounds checked- equal and bilateral Secured at: 22 cm Tube secured with: Tape Dental Injury: Teeth and Oropharynx as per pre-operative assessment

## 2020-03-27 ENCOUNTER — Encounter: Payer: Self-pay | Admitting: *Deleted

## 2020-03-27 DIAGNOSIS — M48061 Spinal stenosis, lumbar region without neurogenic claudication: Secondary | ICD-10-CM | POA: Diagnosis not present

## 2020-03-27 MED ORDER — HYDROXYZINE HCL 50 MG/ML IM SOLN
50.0000 mg | Freq: Four times a day (QID) | INTRAMUSCULAR | Status: DC | PRN
  Administered 2020-03-27: 50 mg via INTRAMUSCULAR

## 2020-03-27 MED FILL — Thrombin (Recombinant) For Soln 20000 Unit: CUTANEOUS | Qty: 1 | Status: AC

## 2020-03-27 NOTE — Progress Notes (Signed)
    Subjective: Procedure(s) (LRB): ANTERIOR LATERAL LUMBAR FUSION (XLIF) LUMBAR TWO-THREE WITH POSTERIOR SPINAL FUSION INTERBODY LUMBAR TWO-THREE (N/A) 1 Day Post-Op  Patient reports pain as 3 on 0-10 scale.  Reports decreased leg pain reports incisional back pain   Positive void Negative bowel movement Positive flatus Negative chest pain or shortness of breath  Objective: Vital signs in last 24 hours: Temp:  [97.5 F (36.4 C)-98.6 F (37 C)] 97.7 F (36.5 C) (05/14 0317) Pulse Rate:  [62-82] 68 (05/14 0317) Resp:  [12-22] 18 (05/14 0317) BP: (101-143)/(46-94) 140/70 (05/14 0317) SpO2:  [92 %-97 %] 96 % (05/14 0317) Weight:  [81.6 kg] 81.6 kg (05/13 1102)  Intake/Output from previous day: 05/13 0701 - 05/14 0700 In: 1600 [I.V.:1600] Out: 200 [Emesis/NG output:100; Blood:100]  Labs: No results for input(s): WBC, RBC, HCT, PLT in the last 72 hours. No results for input(s): NA, K, CL, CO2, BUN, CREATININE, GLUCOSE, CALCIUM in the last 72 hours. No results for input(s): LABPT, INR in the last 72 hours.  Physical Exam: Neurologically intact ABD soft Intact pulses distally Incision: dressing C/D/I and no drainage Compartment soft Body mass index is 26.58 kg/m.   Assessment/Plan: Patient stable  xrays n/a Continue mobilization with physical therapy Continue care  Advance diet Up with therapy  Overall patient doing very well.  Full range of motion of the lower extremity with no significant radicular leg pain.  Plan on discharge to home later today.  We will work with therapy prior to discharge.  Plan on follow-up in 2 weeks for wound check and reevaluation.  Melina Schools, MD [facility] [phone]

## 2020-03-27 NOTE — Progress Notes (Signed)
Patient is discharged from room 3C02 at this time. Alert and in stable condition. IV site d/c'd and instructions read to patient and spouse with understanding verbalized ans all questions answered. Left unit via wheelchair with all belongings at side.

## 2020-03-27 NOTE — Evaluation (Signed)
Occupational Therapy Evaluation Patient Details Name: Taylor King MRN: [id number] DOB: 08/23/1951 Today's Date: 03/27/2020    History of Present Illness Pt is a 69 y/o male with PMH arthritis, chronic back pain, PVD presenting with back pain.  S/P L2-3 XLIF with PSFI L2-3.    Clinical Impression   PTA patient independent and driving. Admitted for above and is limited by problem list below, including back pain, precaution adherence, and impaired balance.  He demonstrates ability to complete LB ADLS with supervision, brace mgmt with min assist, transfers and in room mobility with supervision without AD.  Educated on back precautions, brace mgmt and wear schedule, activity progression, ADL compensatory techniques, DME and recommendations.  Pt verbalizes understanding, requires min cueing for precaution adherence throughout session.  Patient will benefit from further OT services acutely to optimize independence and safety with ADLs, mobility but anticipate no further needs after dc home as pt will have support from spouse 24/7.      Follow Up Recommendations  No OT follow up;Supervision/Assistance - 24 hour    Equipment Recommendations  None recommended by OT    Recommendations for Other Services       Precautions / Restrictions Precautions Precautions: Back Precaution Booklet Issued: Yes (comment) Precaution Comments: reviewed with pt  Required Braces or Orthoses: Spinal Brace Spinal Brace: Lumbar corset;Applied in sitting position Restrictions Weight Bearing Restrictions: No      Mobility Bed Mobility Overal bed mobility: Needs Assistance             General bed mobility comments: sitting EOB upon entry  Transfers Overall transfer level: Needs assistance   Transfers: Sit to/from Stand Sit to Stand: Supervision         General transfer comment: cueing for hand placement and body mechanics    Balance Overall balance assessment: Needs assistance Sitting-balance  support: No upper extremity supported;Feet supported Sitting balance-Leahy Scale: Good     Standing balance support: No upper extremity supported;During functional activity Standing balance-Leahy Scale: Fair                             ADL either performed or assessed with clinical judgement   ADL Overall ADL's : Needs assistance/impaired     Grooming: Supervision/safety;Standing   Upper Body Bathing: Set up;Sitting   Lower Body Bathing: Supervison/ safety;Cueing for back precautions;Sit to/from stand   Upper Body Dressing : Minimal assistance;Sitting Upper Body Dressing Details (indicate cue type and reason): brace mgmt  Lower Body Dressing: Supervision/safety;Sit to/from stand;Cueing for back precautions;Cueing for compensatory techniques   Toilet Transfer: Supervision/safety;Ambulation       Tub/ Shower Transfer: Walk-in shower;Supervision/safety;Ambulation;Shower Scientist, research (medical) Details (indicate cue type and reason): simulated side stepping into shower with supervision  Functional mobility during ADLs: Supervision/safety General ADL Comments: pt educated on ADL compenatory techniques, recommednations, safety, back precautions and brace mgmt      Vision   Vision Assessment?: No apparent visual deficits     Perception     Praxis      Pertinent Vitals/Pain Pain Assessment: Faces Faces Pain Scale: Hurts little more Pain Location: back  Pain Descriptors / Indicators: Discomfort;Operative site guarding Pain Intervention(s): Limited activity within patient's tolerance;Monitored during session;Repositioned     Hand Dominance     Extremity/Trunk Assessment Upper Extremity Assessment Upper Extremity Assessment: Overall WFL for tasks assessed   Lower Extremity Assessment Lower Extremity Assessment: Defer to PT evaluation   Cervical / Trunk Assessment  Cervical / Trunk Assessment: Other exceptions Cervical / Trunk Exceptions: s/p back surgery    Communication Communication Communication: No difficulties   Cognition Arousal/Alertness: Awake/alert Behavior During Therapy: WFL for tasks assessed/performed Overall Cognitive Status: Within Functional Limits for tasks assessed                                     General Comments       Exercises     Shoulder Instructions      Home Living Family/patient expects to be discharged to:: Private residence Living Arrangements: Spouse/significant other Available Help at Discharge: Family;Available 24 hours/day Type of Home: House Home Access: Stairs to enter [employer] of Steps: 3   Home Layout: One level     Bathroom Shower/Tub: Occupational psychologist: Standard     Home Equipment: Environmental consultant - 2 wheels;Crutches;Shower seat - built in;Shower seat          Prior Functioning/Environment Level of Independence: Independent        Comments: driving         OT Problem List: Impaired balance (sitting and/or standing);Decreased activity tolerance;Decreased knowledge of precautions;Pain;Decreased knowledge of use of DME or AE      OT Treatment/Interventions: Self-care/ADL training;DME and/or AE instruction;Therapeutic activities;Patient/family education;Balance training    OT Goals(Current goals can be found in the care plan section) Acute Rehab OT Goals Patient Stated Goal: home  OT Goal Formulation: With patient Time For Goal Achievement: 04/10/20 Potential to Achieve Goals: Good  OT Frequency: Min 2X/week   Barriers to D/C:            Co-evaluation              AM-PAC OT "6 Clicks" Daily Activity     Outcome Measure Help from another person eating meals?: None Help from another person taking care of personal grooming?: A Little Help from another person toileting, which includes using toliet, bedpan, or urinal?: A Little Help from another person bathing (including washing, rinsing, drying)?: A Little Help from another  person to put on and taking off regular upper body clothing?: A Little Help from another person to put on and taking off regular lower body clothing?: A Little 6 Click Score: 19   End of Session Equipment Utilized During Treatment: Back brace Nurse Communication: Mobility status  Activity Tolerance: Patient tolerated treatment well Patient left: with call bell/phone within reach;Other (comment)(seated EOB )  OT Visit Diagnosis: Other abnormalities of gait and mobility (R26.89);Pain Pain - part of body: (back)                Time: 9242-6834 OT Time Calculation (min): 20 min Charges:  OT General Charges $OT Visit: 1 Visit OT Evaluation $OT Eval Low Complexity: 1 Low  Jolaine Artist, OT Acute Rehabilitation Services Pager [phone] Office [phone]   Delight Stare 03/27/2020, 9:51 AM

## 2020-03-27 NOTE — Anesthesia Postprocedure Evaluation (Signed)
Anesthesia Post Note  Patient: Taylor King  Procedure(s) Performed: ANTERIOR LATERAL LUMBAR FUSION (XLIF) LUMBAR TWO-THREE WITH POSTERIOR SPINAL FUSION INTERBODY LUMBAR TWO-THREE (N/A Back)     Patient location during evaluation: PACU Anesthesia Type: General Level of consciousness: awake and alert Pain management: pain level controlled Vital Signs Assessment: post-procedure vital signs reviewed and stable Respiratory status: spontaneous breathing, nonlabored ventilation, respiratory function stable and patient connected to nasal cannula oxygen Cardiovascular status: blood pressure returned to baseline and stable Postop Assessment: no apparent nausea or vomiting Anesthetic complications: no    Last Vitals:  Vitals:   03/27/20 0317 03/27/20 0721  BP: 140/70 (!) 110/53  Pulse: 68 82  Resp: 18 18  Temp: 36.5 C 36.7 C  SpO2: 96% 95%    Last Pain:  Vitals:   03/27/20 0925  TempSrc:   PainSc: 6                  Taylor King

## 2020-03-27 NOTE — Evaluation (Signed)
Physical Therapy Evaluation & Discharge Patient Details Name: Taylor King MRN: [id number] DOB: 08-29-1951 Today's Date: 03/27/2020   History of Present Illness  Pt is a 69 y/o male with PMH arthritis, chronic back pain, PVD presenting with back pain.  S/P L2-3 XLIF with PSFI L2-3.   Clinical Impression  Pt presented supine in bed with HOB elevated, awake and willing to participate in therapy session. Prior to admission, pt reported that he was independent with all functional mobility and ADLs. Pt lives with his wife in a single level home with three steps to enter. At the time of evaluation, pt overall at a supervision level with all functional mobility, including gait training in hallway without use of an AD. Pt participated in stair training this session as well without difficulties. PT provided pt education re: back precautions, car transfers and a generalized walking program for pt to initiate upon d/c home. No further acute PT needs identified at this time. PT signing off.     Follow Up Recommendations No PT follow up    Equipment Recommendations  None recommended by PT    Recommendations for Other Services       Precautions / Restrictions Precautions Precautions: Back Precaution Booklet Issued: Yes (comment) Precaution Comments: reviewed 3/3 back precautions with pt throughout Required Braces or Orthoses: Spinal Brace Spinal Brace: Lumbar corset;Applied in sitting position Restrictions Weight Bearing Restrictions: No      Mobility  Bed Mobility Overal bed mobility: Needs Assistance Bed Mobility: Rolling;Sidelying to Sit Rolling: Supervision Sidelying to sit: Supervision       General bed mobility comments: sitting EOB upon entry  Transfers Overall transfer level: Needs assistance Equipment used: None Transfers: Sit to/from Stand Sit to Stand: Supervision         General transfer comment: for safety  Ambulation/Gait Ambulation/Gait assistance:  Supervision Gait Distance (Feet): 500 Feet Assistive device: None Gait Pattern/deviations: Step-through pattern;Decreased stride length Gait velocity: WFL   General Gait Details: pt with mild instability but no overt LOB or need for physical assistance   Stairs Stairs: Yes Stairs assistance: Min guard Stair Management: One rail Left;Step to pattern;Forwards Number of Stairs: 3 General stair comments: pt ascending/descending steps safely without difficulties, no instability or LOB  Wheelchair Mobility    Modified Rankin (Stroke Patients Only)       Balance Overall balance assessment: Needs assistance Sitting-balance support: No upper extremity supported;Feet supported Sitting balance-Leahy Scale: Good     Standing balance support: No upper extremity supported;During functional activity Standing balance-Leahy Scale: Fair                               Pertinent Vitals/Pain Pain Assessment: 0-10 Pain Score: 2  Faces Pain Scale: Hurts little more Pain Location: back  Pain Descriptors / Indicators: Discomfort;Operative site guarding Pain Intervention(s): Monitored during session;Repositioned    Home Living Family/patient expects to be discharged to:: Private residence Living Arrangements: Spouse/significant other Available Help at Discharge: Family;Available 24 hours/day Type of Home: House Home Access: Stairs to enter   [employer] of Steps: 3 Home Layout: One level Home Equipment: Walker - 2 wheels;Crutches;Shower seat - built in;Shower seat      Prior Function Level of Independence: Independent         Comments: driving      Hand Dominance        Extremity/Trunk Assessment   Upper Extremity Assessment Upper Extremity Assessment: Defer to OT  evaluation;Overall [facility] for tasks assessed    Lower Extremity Assessment Lower Extremity Assessment: Overall WFL for tasks assessed    Cervical / Trunk Assessment Cervical / Trunk  Assessment: Other exceptions Cervical / Trunk Exceptions: s/p back surgery  Communication   Communication: No difficulties  Cognition Arousal/Alertness: Awake/alert Behavior During Therapy: WFL for tasks assessed/performed Overall Cognitive Status: Within Functional Limits for tasks assessed                                        General Comments      Exercises     Assessment/Plan    PT Assessment Patent does not need any further PT services  PT Problem List         PT Treatment Interventions      PT Goals (Current goals can be found in the Care Plan section)  Acute Rehab PT Goals Patient Stated Goal: d/c home today PT Goal Formulation: All assessment and education complete, DC therapy    Frequency     Barriers to discharge        Co-evaluation               AM-PAC PT "6 Clicks" Mobility  Outcome Measure Help needed turning from your back to your side while in a flat bed without using bedrails?: None Help needed moving from lying on your back to sitting on the side of a flat bed without using bedrails?: None Help needed moving to and from a bed to a chair (including a wheelchair)?: None Help needed standing up from a chair using your arms (e.g., wheelchair or bedside chair)?: None Help needed to walk in hospital room?: None Help needed climbing 3-5 steps with a railing? : None 6 Click Score: 24    End of Session Equipment Utilized During Treatment: Back brace Activity Tolerance: Patient tolerated treatment well Patient left: in bed;with call bell/phone within reach;Other (comment)(seated EOB) Nurse Communication: Mobility status PT Visit Diagnosis: Other abnormalities of gait and mobility (R26.89)    Time: 0045-9977 PT Time Calculation (min) (ACUTE ONLY): 17 min   Charges:   PT Evaluation $PT Eval Low Complexity: 1 Low          Eduard Clos, PT, DPT  Acute Rehabilitation Services Pager [phone] Office  [place] 03/27/2020, 11:50 AM

## 2020-03-30 NOTE — Discharge Summary (Signed)
Patient ID: Taylor King MRN: [id number] DOB/AGE: [birth year] 69 y.o.  Admit date: 03/26/2020 Discharge date: 03/30/2020  Admission Diagnoses:  Active Problems:   Lumbar spinal stenosis   Discharge Diagnoses:  Active Problems:   Lumbar spinal stenosis  status post Procedure(s): ANTERIOR LATERAL LUMBAR FUSION (XLIF) LUMBAR TWO-THREE WITH POSTERIOR SPINAL FUSION INTERBODY LUMBAR TWO-THREE  Past Medical History:  Diagnosis Date  . Arthritis   . Chronic back pain   . Diverticulitis   . Dyspnea    with exertion  . GERD (gastroesophageal reflux disease)    occ  . History of kidney stones   . Peripheral vascular disease (HCC)    aaa 39 mm 02-14-17 epic [place]    Surgeries: Procedure(s): ANTERIOR LATERAL LUMBAR FUSION (XLIF) LUMBAR TWO-THREE WITH POSTERIOR SPINAL FUSION INTERBODY LUMBAR TWO-THREE on 03/26/2020   Consultants:   Discharged Condition: Improved  Hospital Course: Taylor King is an 69 y.o. 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 Patient failed conservative treatments (please see the history and physical for the specifics) and had severe unremitting pain that affects sleep, daily activities and work/hobbies. After pre-op clearance, the patient was taken to the operating room on 03/26/2020 and underwent  Procedure(s): ANTERIOR LATERAL LUMBAR FUSION (XLIF) LUMBAR TWO-THREE WITH POSTERIOR SPINAL FUSION INTERBODY LUMBAR TWO-THREE.    Patient was given perioperative antibiotics:  Anti-infectives (From admission, onward)   Start     Dose/Rate Route Frequency Ordered Stop   03/26/20 2030  ceFAZolin (ANCEF) IVPB 1 g/50 mL premix     1 g 100 mL/hr over 30 Minutes Intravenous Every 8 hours 03/26/20 1827 03/27/20 1110   03/26/20 1053  ceFAZolin (ANCEF) 2-4 GM/100ML-% IVPB    Note to Pharmacy: Tressia Miners Ch: cabinet override      03/26/20 1053 03/26/20 1254   03/26/20 1048  ceFAZolin (ANCEF) IVPB  2g/100 mL premix     2 g 200 mL/hr over 30 Minutes Intravenous 30 min pre-op 03/26/20 1048 03/26/20 1226       Patient was given sequential compression devices and early ambulation to prevent DVT.   Patient benefited maximally from hospital stay and there were no complications. At the time of discharge, the patient was urinating/moving their bowels without difficulty, tolerating a regular diet, pain is controlled with oral pain medications and they have been cleared by PT/OT.   Recent vital signs: No data found.   Recent laboratory studies: No results for input(s): WBC, HGB, HCT, PLT, NA, K, CL, CO2, BUN, CREATININE, GLUCOSE, INR, CALCIUM in the last 72 hours.  Invalid input(s): PT, 2   Discharge Medications:   Allergies as of 03/27/2020      Reactions   Other Other (See Comments)   Anesthesia medication (pt unsure of which med)- reaction upset stomach. (after colonoscopy)      Medication List    TAKE these medications   allopurinol 300 MG tablet Commonly known as: ZYLOPRIM Take 300 mg by mouth daily.   atenolol 25 MG tablet Commonly known as: TENORMIN Take 12.5 mg by mouth daily.   methocarbamol 500 MG tablet Commonly known as: Robaxin Take 1 tablet (500 mg total) by mouth every 8 (eight) hours as needed for up to 5 days for muscle spasms.   ondansetron 4 MG tablet Commonly known as: Zofran Take 1 tablet (4 mg total) by mouth every 8 (eight) hours as needed for nausea or vomiting.   oxyCODONE-acetaminophen 10-325 MG tablet Commonly known as:  Percocet Take 1 tablet by mouth every 6 (six) hours as needed for up to 5 days for pain.   Potassium Citrate 15 MEQ (1620 MG) Tbcr Take 15 mEq by mouth in the morning and at bedtime.   vitamin B-12 1000 MCG tablet Commonly known as: CYANOCOBALAMIN Take 1,000 mcg by mouth daily.       Diagnostic Studies: DG Chest 2 View  Result Date: 03/23/2020 CLINICAL DATA:  Pre-admission evaluation. EXAM: CHEST - 2 VIEW COMPARISON:   November 22, 2011 FINDINGS: There is no evidence of acute infiltrate, pleural effusion or pneumothorax. The heart size and mediastinal contours are within normal limits. There is mild calcification of the aortic arch. The visualized skeletal structures are unremarkable. IMPRESSION: No active cardiopulmonary disease. Electronically Signed   By: Virgina Norfolk M.D.   On: 03/23/2020 21:49   DG Lumbar Spine 2-3 Views  Result Date: 03/26/2020 CLINICAL DATA:  Lumbar fusion EXAM: LUMBAR SPINE - 2-3 VIEW; DG C-ARM 1-60 MIN COMPARISON:  None. FLUOROSCOPY TIME:  Fluoroscopy Time:  3 minutes 39 seconds Radiation Exposure Index (if provided by the fluoroscopic device): Not available Number of Acquired Spot Images: 2 FINDINGS: Interbody fusion is noted at L2-3 with pedicle screws and posterior fixation. IMPRESSION: L2-3 fusion. Electronically Signed   By: Inez Catalina M.D.   On: 03/26/2020 17:19   DG C-Arm 1-60 Min  Result Date: 03/26/2020 CLINICAL DATA:  Lumbar fusion EXAM: LUMBAR SPINE - 2-3 VIEW; DG C-ARM 1-60 MIN COMPARISON:  None. FLUOROSCOPY TIME:  Fluoroscopy Time:  3 minutes 39 seconds Radiation Exposure Index (if provided by the fluoroscopic device): Not available Number of Acquired Spot Images: 2 FINDINGS: Interbody fusion is noted at L2-3 with pedicle screws and posterior fixation. IMPRESSION: L2-3 fusion. Electronically Signed   By: Inez Catalina M.D.   On: 03/26/2020 17:19   DG OR LOCAL ABDOMEN  Result Date: 03/26/2020 CLINICAL DATA:  Concern with instrument count.  Intraoperative case EXAM: OR LOCAL ABDOMEN COMPARISON:  December 26, 2019 FINDINGS: There is postoperative change in the mid lumbar spine. Skin staples are noted on the right. No needle or surgical instrument demonstrable on this study. There is no bowel dilatation or air-fluid level to suggest bowel obstruction. No free air evident. Moderate stool in colon. IMPRESSION: Postoperative changes. No bowel obstruction. No retained instrument or  needle evident. These results were called by telephone at the time of interpretation on 03/26/2020 at 3:37 pm to provider Bing Quarry, RN , who verbally acknowledged these results. Electronically Signed   By: Lowella Grip III M.D.   On: 03/26/2020 15:37    Discharge Instructions    Incentive spirometry RT   Complete by: As directed       Follow-up Information    Melina Schools, MD. Schedule an appointment as soon as possible for a visit in 2 weeks.   Specialty: Orthopedic Surgery Why: For suture removal, For wound re-check Contact information: [address] [place] [address] [phone]           Discharge Plan:  discharge to home  Disposition: stable    Signed: Yvonne Kendall Ward for [facility] PA-C [facility] [phone] 03/30/2020, 8:38 AM

## 2020-05-07 DIAGNOSIS — M545 Low back pain: Secondary | ICD-10-CM | POA: Diagnosis not present

## 2020-05-08 DIAGNOSIS — Z4789 Encounter for other orthopedic aftercare: Secondary | ICD-10-CM | POA: Diagnosis not present

## 2020-05-13 DIAGNOSIS — M545 Low back pain: Secondary | ICD-10-CM | POA: Diagnosis not present

## 2020-05-15 DIAGNOSIS — M545 Low back pain: Secondary | ICD-10-CM | POA: Diagnosis not present

## 2020-05-22 DIAGNOSIS — M545 Low back pain: Secondary | ICD-10-CM | POA: Diagnosis not present

## 2020-05-26 DIAGNOSIS — M545 Low back pain: Secondary | ICD-10-CM | POA: Diagnosis not present

## 2020-05-28 DIAGNOSIS — M545 Low back pain: Secondary | ICD-10-CM | POA: Diagnosis not present

## 2020-06-01 DIAGNOSIS — M545 Low back pain: Secondary | ICD-10-CM | POA: Diagnosis not present

## 2020-06-04 DIAGNOSIS — M545 Low back pain: Secondary | ICD-10-CM | POA: Diagnosis not present

## 2020-06-08 DIAGNOSIS — M545 Low back pain: Secondary | ICD-10-CM | POA: Diagnosis not present

## 2020-06-11 DIAGNOSIS — M545 Low back pain: Secondary | ICD-10-CM | POA: Diagnosis not present

## 2020-06-15 DIAGNOSIS — M545 Low back pain: Secondary | ICD-10-CM | POA: Diagnosis not present

## 2020-06-19 ENCOUNTER — Ambulatory Visit (HOSPITAL_COMMUNITY)
Admission: RE | Admit: 2020-06-19 | Discharge: 2020-06-19 | Disposition: A | Discharge status: Home / Self Care | Payer: PPO | Source: Ambulatory Visit | Attending: Surgery | Admitting: Surgery

## 2020-06-19 ENCOUNTER — Other Ambulatory Visit (HOSPITAL_COMMUNITY): Payer: Self-pay | Admitting: Orthopedic Surgery

## 2020-06-19 ENCOUNTER — Other Ambulatory Visit: Payer: Self-pay

## 2020-06-19 DIAGNOSIS — M79604 Pain in right leg: Secondary | ICD-10-CM

## 2020-06-19 DIAGNOSIS — Z4889 Encounter for other specified surgical aftercare: Secondary | ICD-10-CM | POA: Diagnosis not present

## 2020-06-19 DIAGNOSIS — M79661 Pain in right lower leg: Secondary | ICD-10-CM | POA: Diagnosis not present

## 2020-06-19 DIAGNOSIS — M79605 Pain in left leg: Secondary | ICD-10-CM | POA: Diagnosis not present

## 2020-06-19 DIAGNOSIS — M79662 Pain in left lower leg: Secondary | ICD-10-CM | POA: Diagnosis not present

## 2020-06-23 DIAGNOSIS — N200 Calculus of kidney: Secondary | ICD-10-CM | POA: Diagnosis not present

## 2020-06-23 DIAGNOSIS — R3121 Asymptomatic microscopic hematuria: Secondary | ICD-10-CM | POA: Diagnosis not present

## 2020-06-23 DIAGNOSIS — N281 Cyst of kidney, acquired: Secondary | ICD-10-CM | POA: Diagnosis not present

## 2020-07-02 ENCOUNTER — Ambulatory Visit (INDEPENDENT_AMBULATORY_CARE_PROVIDER_SITE_OTHER): Payer: PPO

## 2020-07-02 ENCOUNTER — Encounter: Payer: Self-pay | Admitting: Orthopedic Surgery

## 2020-07-02 ENCOUNTER — Ambulatory Visit: Payer: PPO | Admitting: Orthopedic Surgery

## 2020-07-02 VITALS — Ht 69.0 in | Wt 180.0 lb

## 2020-07-02 DIAGNOSIS — M25562 Pain in left knee: Secondary | ICD-10-CM | POA: Diagnosis not present

## 2020-07-02 NOTE — Progress Notes (Signed)
Office Visit Note   Patient: Taylor King           Date of Birth: 06/10/1951           MRN: [id number] Visit Date: 07/02/2020              Requested by: Seward Carol, MD [address] [place],   [id number] PCP: Seward Carol, MD  Chief Complaint  Patient presents with  . Left Knee - Pain    Hx of toal knee replacement 11/25/2011      HPI: Patient is a 69 year old gentleman who is status post a total knee arthroplasty approximately 8 years ago.  He states he has had episodes of stiffness and walking with his knee that started about 2 weeks ago.  Patient states that a few days he had to use a walker and a cane.  Patient states recently has had spinal surgery with Dr. Rolena Infante in May of this year.  He states that he was for a prolonged period of time not active he is started doing physical therapy.  Assessment & Plan: Visit Diagnoses:  1. Acute pain of left knee     Plan: Discussed that his symptoms are most likely due to the inactivity from his spinal surgery recommended continuing with his physical therapy and continue with strengthening for the knee.  Follow-Up Instructions: Return if symptoms worsen or fail to improve.   Ortho Exam  Patient is alert, oriented, no adenopathy, well-dressed, normal affect, normal respiratory effort. Examination patient has active full range of motion of the left knee collateral ligaments are stable the patella is midline there is no effusion there is no redness no cellulitis there is no instability with range of motion and no subluxation of the patella.  Patient does have quad atrophy.  Imaging: XR Knee 1-2 Views Left  Result Date: 07/02/2020 2 view radiographs of the left knee shows a stable total knee arthroplasty no lucency no joint space narrowing the patella is midline  No images are attached to the encounter.  Labs: No results found for: HGBA1C, ESRSEDRATE, CRP, LABURIC, REPTSTATUS, GRAMSTAIN, CULT,  LABORGA   Lab Results  Component Value Date   ALBUMIN 4.2 11/22/2011    No results found for: MG No results found for: VD25OH  No results found for: PREALBUMIN CBC EXTENDED Latest Ref Rng & Units 03/23/2020 11/27/2011 11/26/2011  WBC 4.0 - 10.5 K/uL 10.0 13.4(H) 15.5(H)  RBC 4.22 - 5.81 MIL/uL 4.19(L) 3.03(L) 3.39(L)  HGB 13.0 - 17.0 g/dL 13.2 10.1(L) 11.4(L)  HCT 39 - 52 % 41.2 31.2(L) 34.5(L)  PLT 150 - 400 K/uL 375 248 271     Body mass index is 26.58 kg/m.  Orders:  Orders Placed This Encounter  Procedures  . XR Knee 1-2 Views Left   No orders of the defined types were placed in this encounter.    Procedures: No procedures performed  Clinical Data: No additional findings.  ROS:  All other systems negative, except as noted in the HPI. Review of Systems  Objective: Vital Signs: Ht 5\' 9"  (1.753 m)   Wt 180 lb (81.6 kg)   BMI 26.58 kg/m   Specialty Comments:  No specialty comments available.  PMFS History: Patient Active Problem List   Diagnosis Date Noted  . Lumbar spinal stenosis 03/26/2020   Past Medical History:  Diagnosis Date  . Arthritis   . Chronic back pain   . Diverticulitis   . Dyspnea  with exertion  . GERD (gastroesophageal reflux disease)    occ  . History of kidney stones   . Peripheral vascular disease (HCC)    aaa 39 mm 02-14-17 epic [place]    History reviewed. No pertinent family history.  Past Surgical History:  Procedure Laterality Date  . adenoids removed   age 47  . ANTERIOR LAT LUMBAR FUSION N/A 03/26/2020   Procedure: ANTERIOR LATERAL LUMBAR FUSION (XLIF) LUMBAR TWO-THREE WITH POSTERIOR SPINAL FUSION INTERBODY LUMBAR TWO-THREE;  Surgeon: Melina Schools, MD;  Location: [place];  Service: Orthopedics;  Laterality: N/A;  4 hrs Left tap block with exparel  . COLONOSCOPY WITH PROPOFOL N/A 03/07/2017   Procedure: COLONOSCOPY WITH PROPOFOL;  Surgeon: Garlan Fair, MD;  Location: [facility];  Service: Endoscopy;  Laterality: N/A;   . colonscopy     x 3  . KNEE ARTHROSCOPY  [id number]   left knee  . TONSILLECTOMY  age 76  . TOTAL KNEE ARTHROPLASTY  11/25/2011   Procedure: TOTAL KNEE ARTHROPLASTY;  Surgeon: Newt Minion, MD;  Location: [place];  Service: Orthopedics;  Laterality: Left;  Left Total Knee Arthroplasty   Social History   Occupational History  . Not on file  Tobacco Use  . Smoking status: Former Smoker    Packs/day: 0.25    Years: 30.00    Pack years: 7.50    Types: Cigarettes  . Smokeless tobacco: Never Used  . Tobacco comment: quit 2016  Substance and Sexual Activity  . Alcohol use: No  . Drug use: No  . Sexual activity: Not on file

## 2020-07-16 DIAGNOSIS — N400 Enlarged prostate without lower urinary tract symptoms: Secondary | ICD-10-CM | POA: Diagnosis not present

## 2020-07-16 DIAGNOSIS — R3121 Asymptomatic microscopic hematuria: Secondary | ICD-10-CM | POA: Diagnosis not present

## 2020-07-16 DIAGNOSIS — N200 Calculus of kidney: Secondary | ICD-10-CM | POA: Diagnosis not present

## 2020-08-12 DIAGNOSIS — I714 Abdominal aortic aneurysm, without rupture: Secondary | ICD-10-CM | POA: Diagnosis not present

## 2020-08-12 DIAGNOSIS — R3121 Asymptomatic microscopic hematuria: Secondary | ICD-10-CM | POA: Diagnosis not present

## 2020-08-12 DIAGNOSIS — I700 Atherosclerosis of aorta: Secondary | ICD-10-CM | POA: Diagnosis not present

## 2020-08-12 DIAGNOSIS — N200 Calculus of kidney: Secondary | ICD-10-CM | POA: Diagnosis not present

## 2020-08-12 DIAGNOSIS — K5730 Diverticulosis of large intestine without perforation or abscess without bleeding: Secondary | ICD-10-CM | POA: Diagnosis not present

## 2020-09-07 DIAGNOSIS — I714 Abdominal aortic aneurysm, without rupture: Secondary | ICD-10-CM | POA: Diagnosis not present

## 2020-09-07 DIAGNOSIS — R3121 Asymptomatic microscopic hematuria: Secondary | ICD-10-CM | POA: Diagnosis not present

## 2020-09-07 DIAGNOSIS — N281 Cyst of kidney, acquired: Secondary | ICD-10-CM | POA: Diagnosis not present

## 2020-09-07 DIAGNOSIS — Z87442 Personal history of urinary calculi: Secondary | ICD-10-CM | POA: Diagnosis not present

## 2020-09-07 DIAGNOSIS — N179 Acute kidney failure, unspecified: Secondary | ICD-10-CM | POA: Diagnosis not present

## 2020-09-07 DIAGNOSIS — R319 Hematuria, unspecified: Secondary | ICD-10-CM | POA: Diagnosis not present

## 2020-09-07 DIAGNOSIS — M1000 Idiopathic gout, unspecified site: Secondary | ICD-10-CM | POA: Diagnosis not present

## 2020-09-07 DIAGNOSIS — N184 Chronic kidney disease, stage 4 (severe): Secondary | ICD-10-CM | POA: Diagnosis not present

## 2020-09-07 DIAGNOSIS — R809 Proteinuria, unspecified: Secondary | ICD-10-CM | POA: Diagnosis not present

## 2020-09-09 ENCOUNTER — Other Ambulatory Visit: Payer: Self-pay | Admitting: Nephrology

## 2020-09-09 DIAGNOSIS — N179 Acute kidney failure, unspecified: Secondary | ICD-10-CM

## 2020-09-11 ENCOUNTER — Other Ambulatory Visit (HOSPITAL_COMMUNITY): Payer: Self-pay | Admitting: Nephrology

## 2020-09-11 DIAGNOSIS — N179 Acute kidney failure, unspecified: Secondary | ICD-10-CM

## 2020-09-17 ENCOUNTER — Ambulatory Visit
Admission: RE | Admit: 2020-09-17 | Discharge: 2020-09-17 | Disposition: A | Discharge status: Home / Self Care | Payer: PPO | Source: Ambulatory Visit | Attending: Nephrology | Admitting: Nephrology

## 2020-09-17 DIAGNOSIS — N179 Acute kidney failure, unspecified: Secondary | ICD-10-CM

## 2020-09-17 DIAGNOSIS — N281 Cyst of kidney, acquired: Secondary | ICD-10-CM | POA: Diagnosis not present

## 2020-09-23 ENCOUNTER — Other Ambulatory Visit: Payer: Self-pay | Admitting: Student

## 2020-09-24 ENCOUNTER — Ambulatory Visit (HOSPITAL_COMMUNITY)
Admission: RE | Admit: 2020-09-24 | Discharge: 2020-09-24 | Disposition: A | Discharge status: Home / Self Care | Payer: PPO | Source: Ambulatory Visit | Attending: Nephrology | Admitting: Nephrology

## 2020-09-24 ENCOUNTER — Other Ambulatory Visit: Payer: Self-pay

## 2020-09-24 ENCOUNTER — Encounter (HOSPITAL_COMMUNITY): Payer: Self-pay

## 2020-09-24 DIAGNOSIS — N013 Rapidly progressive nephritic syndrome with diffuse mesangial proliferative glomerulonephritis: Secondary | ICD-10-CM | POA: Diagnosis not present

## 2020-09-24 DIAGNOSIS — M1991 Primary osteoarthritis, unspecified site: Secondary | ICD-10-CM | POA: Diagnosis present

## 2020-09-24 DIAGNOSIS — G8929 Other chronic pain: Secondary | ICD-10-CM | POA: Diagnosis present

## 2020-09-24 DIAGNOSIS — Z79899 Other long term (current) drug therapy: Secondary | ICD-10-CM | POA: Diagnosis not present

## 2020-09-24 DIAGNOSIS — Z87442 Personal history of urinary calculi: Secondary | ICD-10-CM | POA: Diagnosis not present

## 2020-09-24 DIAGNOSIS — N179 Acute kidney failure, unspecified: Secondary | ICD-10-CM

## 2020-09-24 DIAGNOSIS — K219 Gastro-esophageal reflux disease without esophagitis: Secondary | ICD-10-CM | POA: Diagnosis present

## 2020-09-24 DIAGNOSIS — N058 Unspecified nephritic syndrome with other morphologic changes: Secondary | ICD-10-CM | POA: Diagnosis present

## 2020-09-24 DIAGNOSIS — D539 Nutritional anemia, unspecified: Secondary | ICD-10-CM | POA: Diagnosis present

## 2020-09-24 DIAGNOSIS — Z87891 Personal history of nicotine dependence: Secondary | ICD-10-CM | POA: Diagnosis not present

## 2020-09-24 DIAGNOSIS — I10 Essential (primary) hypertension: Secondary | ICD-10-CM | POA: Diagnosis present

## 2020-09-24 DIAGNOSIS — M549 Dorsalgia, unspecified: Secondary | ICD-10-CM | POA: Diagnosis present

## 2020-09-24 DIAGNOSIS — E872 Acidosis: Secondary | ICD-10-CM | POA: Diagnosis present

## 2020-09-24 DIAGNOSIS — I739 Peripheral vascular disease, unspecified: Secondary | ICD-10-CM | POA: Diagnosis present

## 2020-09-24 DIAGNOSIS — R944 Abnormal results of kidney function studies: Secondary | ICD-10-CM | POA: Diagnosis not present

## 2020-09-24 DIAGNOSIS — N059 Unspecified nephritic syndrome with unspecified morphologic changes: Secondary | ICD-10-CM | POA: Diagnosis not present

## 2020-09-24 DIAGNOSIS — N08 Glomerular disorders in diseases classified elsewhere: Secondary | ICD-10-CM | POA: Diagnosis not present

## 2020-09-24 DIAGNOSIS — R319 Hematuria, unspecified: Secondary | ICD-10-CM | POA: Diagnosis not present

## 2020-09-24 DIAGNOSIS — M48061 Spinal stenosis, lumbar region without neurogenic claudication: Secondary | ICD-10-CM | POA: Diagnosis present

## 2020-09-24 DIAGNOSIS — R9431 Abnormal electrocardiogram [ECG] [EKG]: Secondary | ICD-10-CM | POA: Diagnosis not present

## 2020-09-24 DIAGNOSIS — Z20822 Contact with and (suspected) exposure to covid-19: Secondary | ICD-10-CM | POA: Diagnosis present

## 2020-09-24 DIAGNOSIS — D649 Anemia, unspecified: Secondary | ICD-10-CM | POA: Diagnosis not present

## 2020-09-24 DIAGNOSIS — R809 Proteinuria, unspecified: Secondary | ICD-10-CM | POA: Diagnosis not present

## 2020-09-24 LAB — CBC
HCT: 24.6 % — ABNORMAL LOW (ref 39.0–52.0)
Hemoglobin: 7.7 g/dL — ABNORMAL LOW (ref 13.0–17.0)
MCH: 32.4 pg (ref 26.0–34.0)
MCHC: 31.3 g/dL (ref 30.0–36.0)
MCV: 103.4 fL — ABNORMAL HIGH (ref 80.0–100.0)
Platelets: 310 10*3/uL (ref 150–400)
RBC: 2.38 MIL/uL — ABNORMAL LOW (ref 4.22–5.81)
RDW: 13.9 % (ref 11.5–15.5)
WBC: 8.4 10*3/uL (ref 4.0–10.5)
nRBC: 0 % (ref 0.0–0.2)

## 2020-09-24 LAB — PROTIME-INR
INR: 1 (ref 0.8–1.2)
Prothrombin Time: 12.9 seconds (ref 11.4–15.2)

## 2020-09-24 MED ORDER — LIDOCAINE HCL (PF) 1 % IJ SOLN
INTRAMUSCULAR | Status: AC
  Filled 2020-09-24: qty 30

## 2020-09-24 MED ORDER — FENTANYL CITRATE (PF) 100 MCG/2ML IJ SOLN
INTRAMUSCULAR | Status: AC | PRN
Start: 2020-09-24 — End: 2020-09-24
  Administered 2020-09-24: 50 ug via INTRAVENOUS

## 2020-09-24 MED ORDER — MIDAZOLAM HCL 2 MG/2ML IJ SOLN
INTRAMUSCULAR | Status: AC | PRN
  Administered 2020-09-24: 1 mg via INTRAVENOUS

## 2020-09-24 MED ORDER — SODIUM CHLORIDE 0.9 % IV SOLN
INTRAVENOUS | Status: AC | PRN
  Administered 2020-09-24: 250 mL via INTRAVENOUS

## 2020-09-24 MED ORDER — GELATIN ABSORBABLE 12-7 MM EX MISC
CUTANEOUS | Status: AC
  Filled 2020-09-24: qty 1

## 2020-09-24 MED ORDER — FENTANYL CITRATE (PF) 100 MCG/2ML IJ SOLN
INTRAMUSCULAR | Status: AC
  Filled 2020-09-24: qty 2

## 2020-09-24 MED ORDER — SODIUM CHLORIDE 0.9 % IV SOLN: INTRAVENOUS | Status: DC

## 2020-09-24 MED ORDER — MIDAZOLAM HCL 2 MG/2ML IJ SOLN
INTRAMUSCULAR | Status: AC
  Filled 2020-09-24: qty 2

## 2020-09-24 NOTE — Discharge Instructions (Signed)
Open Kidney Biopsy, Care After This sheet gives you information about how to care for yourself after your procedure. Your health care provider may also give you more specific instructions. If you have problems or questions, contact your health care provider. What can I expect after the procedure? After the procedure, it is common to have:  Pain or soreness near the incision.  Bright pink or cloudy urine for 24 hours after the procedure. This is normal. Follow these instructions at home: Incision care   Follow instructions from your health care provider about how to take care of your incision. Make sure you: ? Wash your hands with soap and water before you change your bandage (dressing). If soap and water are not available, use hand sanitizer. ? Change your dressing as told by your health care provider. ? Leave stitches (sutures), skin glue, or adhesive strips in place. These skin closures may need to stay in place for 2 weeks or longer. If adhesive strip edges start to loosen and curl up, you may trim the loose edges. Do not remove adhesive strips completely unless your health care provider tells you to do that.  Check your incision area every day for signs of infection. Check for: ? More redness, swelling, or pain. ? More fluid or blood. ? Warmth. ? Pus or a bad smell. Activity  Return to your normal activities as told by your health care provider. Ask your health care provider what activities are safe for you.  Do not lift anything that is heavier than 10 lb (4.5 kg), or the limit that you are told, until your health care provider tells you that it is safe.  Avoid activities that may put pressure on your abdomen, such as forceful coughing or straining.  Avoid activities that take a lot of effort (are strenuous) until your health care provider approves. Most people will have to wait 2 weeks before returning to activities such as exercise or sexual intercourse. General  instructions  Take over-the-counter and prescription medicines only as told by your health care provider.  You may eat and drink after your procedure. Follow instructions from your health care provider about eating or drinking restrictions.  Keep all follow-up visits as told by your health care provider. This is important. Contact a health care provider if:  You have more redness, swelling, or pain around your incision.  You have more fluid or blood coming from your incision.  Your incision feels warm to the touch.  You have pus or a bad smell coming from your incision.  You have blood in your urine more than 24 hours after your procedure. Get help right away if:  Your urine is dark red or brown.  You have a fever.  You are unable to urinate.  You feel burning when you urinate.  You feel like you may faint.  You have severe pain in your abdomen or side. Summary  After the procedure, it is common to have pain or soreness near the incision.  Bright pink or cloudy urine for 24 hours after the procedure is also normal.  Contact your health care provider if you have worsening pain, signs of infection, or blood in your urine more than 24 hours after your procedure. This information is not intended to replace advice given to you by your health care provider. Make sure you discuss any questions you have with your health care provider. Document Revised: 03/01/2019 Document Reviewed: 05/04/2017 Elsevier Patient Education  [employer].

## 2020-09-24 NOTE — Procedures (Signed)
Interventional Radiology Procedure Note  Procedure: Ultrasound guided non-focal renal biopsy  Findings: Please refer to procedural dictation for full description. Left inferior pole 15 ga core x3.  Gelfoam slurry tract embolization.    Complications: None immediate  Estimated Blood Loss: <5 mL  Recommendations: Bedrest 3 hours. ADAT. Discharge home after bedrest if stable.   Ruthann Cancer, MD

## 2020-09-24 NOTE — H&P (Signed)
Chief Complaint: Patient was seen in consultation today for random renal biopsy at the request of Taylor King  Referring Physician(s): Taylor King  Supervising Physician: Taylor King  Patient Status: [facility] - Out-pt  History of Present Illness: Taylor King is a 69 y.o. male   Acute kidney injury Known long hx renal stones-- follows with Urology Higher dose allopurinol recently  Most recent follow up noted elevation in Cr BP wnl Proteinuria and micro hematuria Was referred to Taylor King  11/4; [place]:  IMPRESSION: Bilateral renal cysts.  Otherwise unremarkable exam.  Scheduled for random renal biopsy  Past Medical History:  Diagnosis Date  . Arthritis   . Chronic back pain   . Diverticulitis   . Dyspnea    with exertion  . GERD (gastroesophageal reflux disease)    occ  . History of kidney stones   . Peripheral vascular disease ([place])    aaa 39 mm 02-14-17 epic [place]    Past Surgical History:  Procedure Laterality Date  . adenoids removed   age 63  . ANTERIOR LAT LUMBAR FUSION N/A 03/26/2020   Procedure: ANTERIOR LATERAL LUMBAR FUSION (XLIF) LUMBAR TWO-THREE WITH POSTERIOR SPINAL FUSION INTERBODY LUMBAR TWO-THREE;  Surgeon: Melina Schools, MD;  Location: [place];  Service: Orthopedics;  Laterality: N/A;  4 hrs Left tap block with exparel  . COLONOSCOPY WITH PROPOFOL N/A 03/07/2017   Procedure: COLONOSCOPY WITH PROPOFOL;  Surgeon: Garlan Fair, MD;  Location: [facility];  Service: Endoscopy;  Laterality: N/A;  . colonscopy     x 3  . KNEE ARTHROSCOPY  [id number]   left knee  . TONSILLECTOMY  age 60  . TOTAL KNEE ARTHROPLASTY  11/25/2011   Procedure: TOTAL KNEE ARTHROPLASTY;  Surgeon: Newt Minion, MD;  Location: [place];  Service: Orthopedics;  Laterality: Left;  Left Total Knee Arthroplasty    Allergies: Other  Medications: Prior to Admission medications   Medication Sig Start Date End Date Taking? Authorizing Provider  acetaminophen  (TYLENOL) 500 MG tablet Take 1,000 mg by mouth every 6 (six) hours as needed for moderate pain or headache.   Yes [provider]  atenolol (TENORMIN) 25 MG tablet Take 12.5 mg by mouth daily. 12/14/16  Yes [provider]  vitamin B-12 (CYANOCOBALAMIN) 1000 MCG tablet Take 1,000 mcg by mouth daily.   Yes [provider]     History reviewed. No pertinent family history.  Social History   Socioeconomic History  . Marital status: Married    Spouse name: Not on file  . Number of children: Not on file  . Years of education: Not on file  . Highest education level: Not on file  Occupational History  . Not on file  Tobacco Use  . Smoking status: Former Smoker    Packs/day: 0.25    Years: 30.00    Pack years: 7.50    Types: Cigarettes  . Smokeless tobacco: Never Used  . Tobacco comment: quit 2016  Substance and Sexual Activity  . Alcohol use: No  . Drug use: No  . Sexual activity: Not on file  Other Topics Concern  . Not on file  Social History Narrative  . Not on file   Social Determinants of Health   Financial Resource Strain:   . Difficulty of Paying Living Expenses: Not on file  Food Insecurity:   . Worried About Charity fundraiser in the Last Year: Not on file  . Ran Out of Food in the  Last Year: Not on file  Transportation Needs:   . Lack of Transportation (Medical): Not on file  . Lack of Transportation (Non-Medical): Not on file  Physical Activity:   . Days of Exercise per Week: Not on file  . Minutes of Exercise per Session: Not on file  Stress:   . Feeling of Stress : Not on file  Social Connections:   . Frequency of Communication with Friends and Family: Not on file  . Frequency of Social Gatherings with Friends and Family: Not on file  . Attends Religious Services: Not on file  . Active Member of Clubs or Organizations: Not on file  . Attends Archivist Meetings: Not on file  . Marital Status: Not on file    Review of  Systems: A 12 point ROS discussed and pertinent positives are indicated in the HPI above.  All other systems are negative.  Review of Systems  Constitutional: Negative for activity change, appetite change, fatigue, fever and unexpected weight change.  Respiratory: Negative for cough and shortness of breath.   Cardiovascular: Negative for chest pain and leg swelling.  Gastrointestinal: Negative for abdominal pain and nausea.  Genitourinary: Negative for decreased urine volume and difficulty urinating.  Musculoskeletal: Negative for back pain.  Neurological: Negative for weakness.  Psychiatric/Behavioral: Negative for behavioral problems and confusion.    Vital Signs: BP (!) 148/79   Pulse 78   Temp 98.8 F (37.1 C) (Oral)   Ht 5\' 9"  (1.753 m)   Wt 175 lb (79.4 kg)   SpO2 99%   BMI 25.84 kg/m   Physical Exam Cardiovascular:     Rate and Rhythm: Normal rate and regular rhythm.     Heart sounds: Normal heart sounds.  Pulmonary:     Breath sounds: Normal breath sounds.  Abdominal:     General: Bowel sounds are normal. There is no distension.     Palpations: Abdomen is soft.     Tenderness: There is no abdominal tenderness.  Musculoskeletal:        General: Normal range of motion.     Right lower leg: No edema.     Left lower leg: No edema.  Skin:    General: Skin is warm.  Neurological:     Mental Status: He is alert and oriented to person, place, and time.  Psychiatric:        Behavior: Behavior normal.     Imaging: US RENAL  Result Date: 09/17/2020 CLINICAL DATA:  AK I EXAM: RENAL / URINARY TRACT ULTRASOUND COMPLETE COMPARISON:  Renal ultrasound 06/23/2020 FINDINGS: Right Kidney: Renal measurements: 10.1 x 6.0 x 6.1 cm = volume: 195 mL. Echogenicity within normal limits. No hydronephrosis. There are 2 renal cysts measuring 1.8 cm and 2.5 cm. Left Kidney: Renal measurements: 12.5 x 6.2 x 4.3 cm = volume: 174 mL. Echogenicity within normal limits. No hydronephrosis. There  are a couple of tiny renal cysts measuring up to 0.8 cm. Bladder: Appears normal for degree of bladder distention. Other: None. IMPRESSION: Bilateral renal cysts.  Otherwise unremarkable exam. Electronically Signed   By: Audie Pinto M.D.   On: 09/17/2020 14:39    Labs:  CBC: Recent Labs    03/23/20 1412  WBC 10.0  HGB 13.2  HCT 41.2  PLT 375    COAGS: Recent Labs    03/23/20 1412  INR 1.0  APTT 31    BMP: Recent Labs    03/23/20 1412  NA 140  K 4.5  CL  105  CO2 24  GLUCOSE 111*  BUN 18  CALCIUM 9.3  CREATININE 1.56*  GFRNONAA 45*  GFRAA 52*    LIVER FUNCTION TESTS: No results for input(s): BILITOT, AST, ALT, ALKPHOS, PROT, ALBUMIN in the last 8760 hours.  TUMOR MARKERS: No results for input(s): AFPTM, CEA, CA199, CHROMGRNA in the last 8760 hours.  Assessment and Plan:  Acute kidney injury Rising creatinine-- unknown etiology Proteinuria; micro hematuria Scheduled for random renal biopsy Risks and benefits of random renal biopsy was discussed with the patient and/or patient's family including, but not limited to bleeding, infection, damage to adjacent structures or low yield requiring additional tests.  All of the questions were answered and there is agreement to proceed.  Consent signed and in chart.   Thank you for this interesting consult.  I greatly enjoyed meeting MEDARD DECUIR and look forward to participating in their care.  A copy of this report was sent to the requesting provider on this date.  Electronically Signed: Lavonia Drafts, PA-C 09/24/2020, 6:43 AM   I spent a total of  30 Minutes   in face to face in clinical consultation, greater than 50% of which was counseling/coordinating care for random renal biopsy

## 2020-09-26 ENCOUNTER — Inpatient Hospital Stay (HOSPITAL_COMMUNITY): Payer: PPO

## 2020-09-26 ENCOUNTER — Encounter (HOSPITAL_COMMUNITY): Payer: Self-pay | Admitting: Emergency Medicine

## 2020-09-26 ENCOUNTER — Other Ambulatory Visit: Payer: Self-pay

## 2020-09-26 ENCOUNTER — Inpatient Hospital Stay (HOSPITAL_COMMUNITY)
Admission: EM | Admit: 2020-09-26 | Discharge: 2020-09-30 | DRG: 683 | Disposition: A | Discharge status: Home / Self Care | Payer: PPO | Attending: Internal Medicine | Admitting: Internal Medicine

## 2020-09-26 ENCOUNTER — Encounter: Payer: Self-pay | Admitting: Nephrology

## 2020-09-26 DIAGNOSIS — N179 Acute kidney failure, unspecified: Secondary | ICD-10-CM | POA: Diagnosis present

## 2020-09-26 DIAGNOSIS — E872 Acidosis: Secondary | ICD-10-CM | POA: Diagnosis present

## 2020-09-26 DIAGNOSIS — Z87891 Personal history of nicotine dependence: Secondary | ICD-10-CM

## 2020-09-26 DIAGNOSIS — K219 Gastro-esophageal reflux disease without esophagitis: Secondary | ICD-10-CM | POA: Diagnosis present

## 2020-09-26 DIAGNOSIS — N058 Unspecified nephritic syndrome with other morphologic changes: Secondary | ICD-10-CM | POA: Diagnosis present

## 2020-09-26 DIAGNOSIS — M549 Dorsalgia, unspecified: Secondary | ICD-10-CM | POA: Diagnosis present

## 2020-09-26 DIAGNOSIS — Z20822 Contact with and (suspected) exposure to covid-19: Secondary | ICD-10-CM | POA: Diagnosis present

## 2020-09-26 DIAGNOSIS — M1991 Primary osteoarthritis, unspecified site: Secondary | ICD-10-CM | POA: Diagnosis present

## 2020-09-26 DIAGNOSIS — I10 Essential (primary) hypertension: Secondary | ICD-10-CM | POA: Diagnosis present

## 2020-09-26 DIAGNOSIS — G8929 Other chronic pain: Secondary | ICD-10-CM | POA: Diagnosis present

## 2020-09-26 DIAGNOSIS — I739 Peripheral vascular disease, unspecified: Secondary | ICD-10-CM | POA: Diagnosis present

## 2020-09-26 DIAGNOSIS — D539 Nutritional anemia, unspecified: Secondary | ICD-10-CM | POA: Diagnosis present

## 2020-09-26 DIAGNOSIS — Z79899 Other long term (current) drug therapy: Secondary | ICD-10-CM

## 2020-09-26 DIAGNOSIS — N059 Unspecified nephritic syndrome with unspecified morphologic changes: Secondary | ICD-10-CM

## 2020-09-26 DIAGNOSIS — M48061 Spinal stenosis, lumbar region without neurogenic claudication: Secondary | ICD-10-CM | POA: Diagnosis present

## 2020-09-26 DIAGNOSIS — Z87442 Personal history of urinary calculi: Secondary | ICD-10-CM | POA: Diagnosis not present

## 2020-09-26 LAB — URINALYSIS, ROUTINE W REFLEX MICROSCOPIC
Bacteria, UA: NONE SEEN
Bilirubin Urine: NEGATIVE
Glucose, UA: NEGATIVE mg/dL
Ketones, ur: NEGATIVE mg/dL
Leukocytes,Ua: NEGATIVE
Nitrite: NEGATIVE
Protein, ur: 100 mg/dL — AB
RBC / HPF: >50 RBC/hpf — ABNORMAL HIGH (ref 0–5)
Specific Gravity, Urine: 1.012 (ref 1.005–1.030)
pH: 5 (ref 5.0–8.0)

## 2020-09-26 LAB — CBC WITH DIFFERENTIAL/PLATELET
Abs Immature Granulocytes: 0.05 10*3/uL (ref 0.00–0.07)
Abs Immature Granulocytes: 0.05 10*3/uL (ref 0.00–0.07)
Basophils Absolute: 0 10*3/uL (ref 0.0–0.1)
Basophils Absolute: 0.1 10*3/uL (ref 0.0–0.1)
Basophils Relative: 0 %
Basophils Relative: 1 %
Eosinophils Absolute: 0.3 10*3/uL (ref 0.0–0.5)
Eosinophils Absolute: 0.3 10*3/uL (ref 0.0–0.5)
Eosinophils Relative: 3 %
Eosinophils Relative: 3 %
HCT: 25.7 % — ABNORMAL LOW (ref 39.0–52.0)
HCT: 23 % — ABNORMAL LOW (ref 39.0–52.0)
Hemoglobin: 8 g/dL — ABNORMAL LOW (ref 13.0–17.0)
Hemoglobin: 7.3 g/dL — ABNORMAL LOW (ref 13.0–17.0)
Immature Granulocytes: 1 %
Immature Granulocytes: 1 %
Lymphocytes Relative: 17 %
Lymphocytes Relative: 20 %
Lymphs Abs: 1.6 10*3/uL (ref 0.7–4.0)
Lymphs Abs: 1.8 10*3/uL (ref 0.7–4.0)
MCH: 32.4 pg (ref 26.0–34.0)
MCH: 33 pg (ref 26.0–34.0)
MCHC: 31.1 g/dL (ref 30.0–36.0)
MCHC: 31.7 g/dL (ref 30.0–36.0)
MCV: 104 fL — ABNORMAL HIGH (ref 80.0–100.0)
MCV: 104.1 fL — ABNORMAL HIGH (ref 80.0–100.0)
Monocytes Absolute: 0.6 10*3/uL (ref 0.1–1.0)
Monocytes Absolute: 0.6 10*3/uL (ref 0.1–1.0)
Monocytes Relative: 7 %
Monocytes Relative: 7 %
Neutro Abs: 7 10*3/uL (ref 1.7–7.7)
Neutro Abs: 6.3 10*3/uL (ref 1.7–7.7)
Neutrophils Relative %: 72 %
Neutrophils Relative %: 68 %
Platelets: 335 10*3/uL (ref 150–400)
Platelets: 301 10*3/uL (ref 150–400)
RBC: 2.47 MIL/uL — ABNORMAL LOW (ref 4.22–5.81)
RBC: 2.21 MIL/uL — ABNORMAL LOW (ref 4.22–5.81)
RDW: 13.9 % (ref 11.5–15.5)
RDW: 13.7 % (ref 11.5–15.5)
WBC: 9.5 10*3/uL (ref 4.0–10.5)
WBC: 9.2 10*3/uL (ref 4.0–10.5)
nRBC: 0 % (ref 0.0–0.2)
nRBC: 0 % (ref 0.0–0.2)

## 2020-09-26 LAB — COMPREHENSIVE METABOLIC PANEL
ALT: 13 U/L (ref 0–44)
AST: 13 U/L — ABNORMAL LOW (ref 15–41)
Albumin: 3.3 g/dL — ABNORMAL LOW (ref 3.5–5.0)
Alkaline Phosphatase: 81 U/L (ref 38–126)
Anion gap: 12 (ref 5–15)
BUN: 52 mg/dL — ABNORMAL HIGH (ref 8–23)
CO2: 17 mmol/L — ABNORMAL LOW (ref 22–32)
Calcium: 9.3 mg/dL (ref 8.9–10.3)
Chloride: 109 mmol/L (ref 98–111)
Creatinine, Ser: 5.22 mg/dL — ABNORMAL HIGH (ref 0.61–1.24)
GFR, Estimated: 11 mL/min — ABNORMAL LOW (ref >60)
Glucose, Bld: 105 mg/dL — ABNORMAL HIGH (ref 70–99)
Potassium: 4.5 mmol/L (ref 3.5–5.1)
Sodium: 138 mmol/L (ref 135–145)
Total Bilirubin: 0.6 mg/dL (ref 0.3–1.2)
Total Protein: 7.1 g/dL (ref 6.5–8.1)

## 2020-09-26 LAB — URIC ACID: Uric Acid, Serum: 7.5 mg/dL (ref 3.7–8.6)

## 2020-09-26 LAB — RESPIRATORY PANEL BY RT PCR (FLU A&B, COVID)
Influenza A by PCR: NEGATIVE
Influenza B by PCR: NEGATIVE
SARS Coronavirus 2 by RT PCR: NEGATIVE

## 2020-09-26 LAB — PROTEIN / CREATININE RATIO, URINE
Creatinine, Urine: 111.3 mg/dL
Protein Creatinine Ratio: 1.48 mg/mg{Cre} — ABNORMAL HIGH (ref 0.00–0.15)
Total Protein, Urine: 165 mg/dL

## 2020-09-26 LAB — PREPARE RBC (CROSSMATCH)

## 2020-09-26 LAB — POC OCCULT BLOOD, ED: Fecal Occult Bld: POSITIVE — AB

## 2020-09-26 LAB — HIV ANTIBODY (ROUTINE TESTING W REFLEX): HIV Screen 4th Generation wRfx: NONREACTIVE

## 2020-09-26 MED ORDER — FAMOTIDINE 20 MG PO TABS
10.0000 mg | ORAL_TABLET | Freq: Every day | ORAL | Status: DC
  Administered 2020-09-26 – 2020-09-30 (×5): 10 mg via ORAL
  Filled 2020-09-26 (×5): qty 1

## 2020-09-26 MED ORDER — ACETAMINOPHEN 325 MG PO TABS
650.0000 mg | ORAL_TABLET | Freq: Four times a day (QID) | ORAL | Status: DC | PRN
  Administered 2020-09-29: 650 mg via ORAL
  Filled 2020-09-26: qty 2

## 2020-09-26 MED ORDER — SENNOSIDES-DOCUSATE SODIUM 8.6-50 MG PO TABS: 1.0000 | ORAL_TABLET | Freq: Every evening | ORAL | Status: DC | PRN

## 2020-09-26 MED ORDER — VITAMIN D 25 MCG (1000 UNIT) PO TABS
1000.0000 [IU] | ORAL_TABLET | Freq: Every day | ORAL | Status: DC
  Administered 2020-09-26 – 2020-09-30 (×5): 1000 [IU] via ORAL
  Filled 2020-09-26 (×5): qty 1

## 2020-09-26 MED ORDER — STERILE WATER FOR INJECTION IV SOLN
INTRAVENOUS | Status: DC
  Filled 2020-09-26 (×2): qty 9.62

## 2020-09-26 MED ORDER — SODIUM CHLORIDE 0.9% IV SOLUTION: Freq: Once | INTRAVENOUS | Status: DC

## 2020-09-26 MED ORDER — ACETAMINOPHEN 650 MG RE SUPP: 650.0000 mg | Freq: Four times a day (QID) | RECTAL | Status: DC | PRN

## 2020-09-26 MED ORDER — SODIUM CHLORIDE 0.9 % IV SOLN
1000.0000 mg | Freq: Every day | INTRAVENOUS | Status: DC
  Filled 2020-09-26: qty 8

## 2020-09-26 NOTE — Progress Notes (Signed)
Nephrology progress note:  69 year old male with history of nephrolithiasis who was initially seen at [place] [facility] for acute kidney injury, hematuria and subnephrotic range proteinuria underwent kidney biopsy on 09/24/2020.  He had ANCA MPO positive. On 11/12, I received a call from [facility] with preliminary kidney biopsy result.  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  I have called patient immediately with the biopsy result and advised him to come to the hospital for treatment.  Patient said he will come today (11/13) afternoon after finishing family obligations. He denied any symptoms. I spoke with his wife today.  She stated that the patient will be coming to ER this afternoon.  Plan: Admission under hospitalist service. Check  UA, urine PCR, QuantiFERON TB, hepatitis B surface antigen, hepatitis B core antibody, hepatitis C antibody, HIV. Check CBC, CMP Once the lab results are back, plan is to start Solu-Medrol 1 g daily for 3 days,  rituximab 1 g, vitamin D 1000 units, Batrim 3x/week as ppx.  Please call us when patient arrives.  Katheran James, MD [facility].

## 2020-09-26 NOTE — ED Notes (Signed)
Labs sent to lab

## 2020-09-26 NOTE — ED Notes (Signed)
Dinner ordered 

## 2020-09-26 NOTE — ED Notes (Signed)
Patient transported to Ultrasound 

## 2020-09-26 NOTE — Consult Note (Addendum)
[facility] Nephrology Consultation Note  Requesting MD: Dr Tana Coast, R Reason for consult: AKI  HPI:  Taylor King is a 69 y.o. male.  with history of bilateral kidney stone, hematuria, proteinuria, AAA, degenerative lumbar disc disease/stenosis status post surgical intervention in 03/2020, initially referred by urologist for AKI with elevated creatinine level of 4.5 associated with microscopic hematuria and proteinuria.  During the work-up of AKI, ANCA MPO was positive >100 with unremarkable ANA, antidsDNA, C3, C4, anti-GBM, SPEP and free light chain.  Patient underwent kidney biopsy on 11/11. 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  Patient was called to the hospital for admission and treatment.  The creatinine level elevated to 4.8 during clinic visit on 09/07/2020.  The CT scan done on 08/12/2020 with tiny nonobstructing calculi on the lower pole of left kidney, infrarenal AAA without any acute finding. The composition of kidney stone was due to uric acid around 80% therefore he is on potassium citrate and allopurinol. The serum creatinine level was around 1 in 06/24/2020, 1.56 in 03/2020 0.8 in 03/2019. He is also on atenolol because of history of AAA. He denies history of diabetes or hypertension.  He reports feeling some abdominal pain yesterday which is improved now.  Denies fever, chills, nausea, vomiting, chest pain, shortness of breath. Denies dysuria, urgency, frequency pelvic or flank pain. He had a history of left hydronephrosis due to kidney stone in the past and reports passing some stone occasionally. He denies skin rash, small joint pain. No history of autoimmune disease. His mother had kidney issues due to chemo before she passed away but no family history of being on dialysis or kidney transplant. He was accompanied by his wife to the  hospital.  In the ER today, the labs showed BUN 52, creatinine 5.22, CO2 17 serum albumin 3.3, hemoglobin 8.  Blood pressure in normal range.  Creatinine, Ser  Date/Time Value Ref Range Status  09/26/2020 12:43 PM 5.22 (H) 0.61 - 1.24 mg/dL Final  03/23/2020 02:12 PM 1.56 (H) 0.61 - 1.24 mg/dL Final  11/26/2011 09:40 AM 0.83 0.50 - 1.35 mg/dL Final  11/22/2011 10:06 AM 0.82 0.50 - 1.35 mg/dL Final     PMHx:   Past Medical History:  Diagnosis Date  . Arthritis   . Chronic back pain   . Diverticulitis   . Dyspnea    with exertion  . GERD (gastroesophageal reflux disease)    occ  . History of kidney stones   . Peripheral vascular disease ([place])    aaa 39 mm 02-14-17 epic [place]    Past Surgical History:  Procedure Laterality Date  . adenoids removed   age 4  . ANTERIOR LAT LUMBAR FUSION N/A 03/26/2020   Procedure: ANTERIOR LATERAL LUMBAR FUSION (XLIF) LUMBAR TWO-THREE WITH POSTERIOR SPINAL FUSION INTERBODY LUMBAR TWO-THREE;  Surgeon: Melina Schools, MD;  Location: [facility];  Service: Orthopedics;  Laterality: N/A;  4 hrs Left tap block with exparel  . COLONOSCOPY WITH PROPOFOL N/A 03/07/2017   Procedure: COLONOSCOPY WITH PROPOFOL;  Surgeon: Garlan Fair, MD;  Location: [facility];  Service: Endoscopy;  Laterality: N/A;  . colonscopy     x 3  . KNEE ARTHROSCOPY  [id number]   left knee  . TONSILLECTOMY  age 41  . TOTAL KNEE ARTHROPLASTY  11/25/2011   Procedure: TOTAL KNEE ARTHROPLASTY;  Surgeon: Newt Minion, MD;  Location: [facility];  Service: Orthopedics;  Laterality: Left;  Left Total Knee Arthroplasty    Family Hx: No family history on file.  Social History:  reports that he has quit smoking. His smoking use included cigarettes. He has a 7.50 pack-year smoking history. He has never used smokeless tobacco. He reports that he does not drink alcohol and does not use drugs.  Allergies:  Allergies  Allergen Reactions  . Other Other (See Comments)    Anesthesia medication (pt unsure  of which med)- reaction upset stomach. (after colonoscopy)    Medications: Prior to Admission medications   Medication Sig Start Date End Date Taking? Authorizing Provider  acetaminophen (TYLENOL) 500 MG tablet Take 1,000 mg by mouth every 6 (six) hours as needed for moderate pain or headache.    [provider]  atenolol (TENORMIN) 25 MG tablet Take 12.5 mg by mouth daily. 12/14/16   [provider]  vitamin B-12 (CYANOCOBALAMIN) 1000 MCG tablet Take 1,000 mcg by mouth daily.    [provider]    I have reviewed the patient's current medications.  Labs:  Results for orders placed or performed during the hospital encounter of 09/26/20 (from the past 48 hour(s))  Comprehensive metabolic panel     Status: Abnormal   Collection Time: 09/26/20 12:43 PM  Result Value Ref Range   Sodium 138 135 - 145 mmol/L   Potassium 4.5 3.5 - 5.1 mmol/L   Chloride 109 98 - 111 mmol/L   CO2 17 (L) 22 - 32 mmol/L   Glucose, Bld 105 (H) 70 - 99 mg/dL    Comment: Glucose reference range applies only to samples taken after fasting for at least 8 hours.   BUN 52 (H) 8 - 23 mg/dL   Creatinine, Ser 5.22 (H) 0.61 - 1.24 mg/dL   Calcium 9.3 8.9 - 10.3 mg/dL   Total Protein 7.1 6.5 - 8.1 g/dL   Albumin 3.3 (L) 3.5 - 5.0 g/dL   AST 13 (L) 15 - 41 U/L   ALT 13 0 - 44 U/L   Alkaline Phosphatase 81 38 - 126 U/L   Total Bilirubin 0.6 0.3 - 1.2 mg/dL   GFR, Estimated 11 (L) >60 mL/min    Comment: (NOTE) Calculated using the CKD-EPI Creatinine Equation (2021)    Anion gap 12 5 - 15    Comment: Performed at [facility] [address] [place], [place] [address]  CBC with Differential     Status: Abnormal   Collection Time: 09/26/20 12:43 PM  Result Value Ref Range   WBC 9.5 4.0 - 10.5 K/uL   RBC 2.47 (L) 4.22 - 5.81 MIL/uL   Hemoglobin 8.0 (L) 13.0 - 17.0 g/dL   HCT 25.7 (L) 39 - 52 %   MCV 104.0 (H) 80.0 - 100.0 fL   MCH 32.4 26.0 - 34.0 pg   MCHC 31.1 30.0 - 36.0 g/dL    RDW 13.9 11.5 - 15.5 %   Platelets 335 150 - 400 K/uL   nRBC 0.0 0.0 - 0.2 %   Neutrophils Relative % 72 %   Neutro Abs 7.0 1.7 - 7.7 K/uL   Lymphocytes Relative 17 %   Lymphs Abs 1.6 0.7 - 4.0 K/uL   Monocytes Relative 7 %   Monocytes Absolute 0.6 0.1 - 1.0 K/uL   Eosinophils Relative 3 %   Eosinophils Absolute 0.3 0.0 - 0.5 K/uL   Basophils Relative 0 %   Basophils Absolute 0.0 0.0 - 0.1 K/uL   Immature  Granulocytes 1 %   Abs Immature Granulocytes 0.05 0.00 - 0.07 K/uL    Comment: Performed at [facility], [place] [address], [place], [place] [address]  Urinalysis, Routine w reflex microscopic Urine, Clean Catch     Status: Abnormal   Collection Time: 09/26/20  1:19 PM  Result Value Ref Range   Color, Urine YELLOW YELLOW   APPearance CLEAR CLEAR   Specific Gravity, Urine 1.012 1.005 - 1.030   pH 5.0 5.0 - 8.0   Glucose, UA NEGATIVE NEGATIVE mg/dL   Hgb urine dipstick LARGE (A) NEGATIVE   Bilirubin Urine NEGATIVE NEGATIVE   Ketones, ur NEGATIVE NEGATIVE mg/dL   Protein, ur 100 (A) NEGATIVE mg/dL   Nitrite NEGATIVE NEGATIVE   Leukocytes,Ua NEGATIVE NEGATIVE   RBC / HPF >50 (H) 0 - 5 RBC/hpf   WBC, UA 0-5 0 - 5 WBC/hpf   Bacteria, UA NONE SEEN NONE SEEN   Hyaline Casts, UA PRESENT     Comment: Performed at [facility] [address], [place], [place] [address]     ROS:  Pertinent items noted in HPI and remainder of comprehensive ROS otherwise negative.  Physical Exam: Vitals:   09/26/20 1415 09/26/20 1430  BP: 130/80 128/80  Pulse: (!) 58 (!) 57  Resp: 20 17  Temp:    SpO2: 99% 98%     General exam: Appears calm and comfortable  Respiratory system: Clear to auscultation. Respiratory effort normal. No wheezing or crackle Cardiovascular system: S1 & S2 heard, RRR.  No pedal edema. Gastrointestinal system: Abdomen is nondistended, soft and nontender. Normal bowel sounds heard. Central nervous system: Alert and oriented. No focal neurological  deficits. Extremities: Symmetric 5 x 5 power. Skin: No rashes, lesions or ulcers Psychiatry: Judgement and insight appear normal. Mood & affect appropriate.   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  He does not have any systemic symptoms.  Follow-up final biopsy result. Plan to admit to the hospital Sending gold QuantiFERON, hepatitis panel before starting immunosuppression.   I have discussed with the patient that the immunosuppression may be able to salvage some of the kidney function.  Since he does not have any systemic symptoms, not planning for plasma exchange. Consult pharmacist to start Solu-Medrol 1 g daily for 3 days and rituximab 1 g. Start vitamin D 1000 unit every day, Pepcid 10 mg. Plan for Bactrim 3 times a week as prophylaxis when he is anemia suppression. Patient agreed with the plan above.  #Acute kidney injury, nonoliguric: Treatment as above.  Monitor BMP.  Avoid nephrotoxins.  Strict ins and out.  #Metabolic acidosis: Starting IV sodium bicarbonate.  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  Monitor CBC.  #Hypertension: Resume atenolol.  Monitor blood pressure.  #History of uric acid nephrolithiasis: Checking uric acid level.  Apparently he is off of allopurinol per his urologist.  I have discussed with ER team as well as with primary team.    Katheran James, MD [facility].   Katonya Blecher Tanna Furry 09/26/2020, 3:01 PM  [facility].

## 2020-09-26 NOTE — ED Triage Notes (Addendum)
Pt states he received a call yesterday from Dr. Carolin Sicks telling him to come to ED due to kidney failure.  Pt states he had a kidney biopsy on Thursday.  Pt denies any symptoms and states he feels fine.    Per note from Dr. 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 Carolin Sicks notified pt is here.  States his plan is for pt to be admitted.  See note from today.

## 2020-09-26 NOTE — ED Provider Notes (Signed)
[facility] EMERGENCY DEPARTMENT Provider Note   CSN: [id number] Arrival date & time: 09/26/20  1210     History Chief Complaint  Patient presents with  . Kidney failure    Taylor King is a 69 y.o. male presenting for evaluation of kidney injury.  Patient states a month ago he was doing a routine follow-up with a urologist when he has found a blood in his urine.  On recheck, patient was found to have an AKI that was significant over the several weeks.  He followed up with Dr. Carolin Sicks, who is done biopsy testing which showed necrotizing crescentic ANCA glomerulonephritis including more than 90% of the glomeruli.  Patient was called to come to the ED for hospitalization and management.  Patient states he is feeling well.  He denies fevers, chills, chest pain, shortness breath, cough, nausea, vomiting abdominal pain, urinary symptoms, normal bowel movements.  He denies extremity swelling.  He is taking only atenolol and B12 vitamins daily.  He is not taking any NSAIDs.  Additional history obtained from chart review.  I reviewed Dr. Louie Boston note with the plan including which labs to order, plan for Solu-Medrol as well as rituximab infusion. Patient with additional history of chronic back pain status post surgery, GERD, history of kidney stones.   HPI     Past Medical History:  Diagnosis Date  . Arthritis   . Chronic back pain   . Diverticulitis   . Dyspnea    with exertion  . GERD (gastroesophageal reflux disease)    occ  . History of kidney stones   . Peripheral vascular disease ([place])    aaa 39 mm 02-14-17 epic [place]    Patient Active Problem List   Diagnosis Date Noted  . Lumbar spinal stenosis 03/26/2020    Past Surgical History:  Procedure Laterality Date  . adenoids removed   age 51  . ANTERIOR LAT LUMBAR FUSION N/A 03/26/2020   Procedure: ANTERIOR LATERAL LUMBAR FUSION (XLIF) LUMBAR TWO-THREE WITH POSTERIOR SPINAL FUSION INTERBODY LUMBAR  TWO-THREE;  Surgeon: Melina Schools, MD;  Location: [facility];  Service: Orthopedics;  Laterality: N/A;  4 hrs Left tap block with exparel  . COLONOSCOPY WITH PROPOFOL N/A 03/07/2017   Procedure: COLONOSCOPY WITH PROPOFOL;  Surgeon: Garlan Fair, MD;  [facility];  Service: Endoscopy;  Laterality: N/A;  . colonscopy     x 3  . KNEE ARTHROSCOPY  6-20012   left knee  . TONSILLECTOMY  age 46  . TOTAL KNEE ARTHROPLASTY  11/25/2011   Procedure: TOTAL KNEE ARTHROPLASTY;  Surgeon: Newt Minion, MD;  Location: [facility];  Service: Orthopedics;  Laterality: Left;  Left Total Knee Arthroplasty       No family history on file.  Social History   Tobacco Use  . Smoking status: Former Smoker    Packs/day: 0.25    Years: 30.00    Pack years: 7.50    Types: Cigarettes  . Smokeless tobacco: Never Used  . Tobacco comment: quit 2016  Substance Use Topics  . Alcohol use: No  . Drug use: No    Home Medications Prior to Admission medications   Medication Sig Start Date End Date Taking? Authorizing Provider  acetaminophen (TYLENOL) 500 MG tablet Take 1,000 mg by mouth every 6 (six) hours as needed for moderate pain or headache.    [provider]  atenolol (TENORMIN) 25 MG tablet Take 12.5 mg by mouth daily. 12/14/16   [provider]  vitamin B-12 (CYANOCOBALAMIN) 1000 MCG tablet Take 1,000 mcg by mouth daily.    [provider]    Allergies    Other  Review of Systems   Review of Systems  All other systems reviewed and are negative.   Physical Exam Updated Vital Signs BP 138/74 (BP Location: Right Arm)   Pulse 70   Temp 98.2 F (36.8 C) (Oral)   Resp 18   Ht 5\' 9"  (1.753 m)   Wt 79.4 kg   SpO2 97%   BMI 25.84 kg/m   Physical Exam Vitals and nursing note reviewed.  Constitutional:      General: He is not in acute distress.    Appearance: He is well-developed.     Comments: Resting in the bed in no acute distress  HENT:     Head:  Normocephalic and atraumatic.  Eyes:     Extraocular Movements: Extraocular movements intact.     Conjunctiva/sclera: Conjunctivae normal.     Pupils: Pupils are equal, round, and reactive to light.  Cardiovascular:     Rate and Rhythm: Normal rate and regular rhythm.     Pulses: Normal pulses.  Pulmonary:     Effort: Pulmonary effort is normal. No respiratory distress.     Breath sounds: Normal breath sounds. No wheezing.  Abdominal:     General: There is no distension.     Palpations: Abdomen is soft. There is no mass.     Tenderness: There is no abdominal tenderness. There is no right CVA tenderness, left CVA tenderness, guarding or rebound.  Musculoskeletal:        General: Normal range of motion.     Cervical back: Normal range of motion and neck supple.     Right lower leg: No edema.     Left lower leg: No edema.  Skin:    General: Skin is warm and dry.     Capillary Refill: Capillary refill takes less than 2 seconds.  Neurological:     Mental Status: He is alert and oriented to person, place, and time.     ED Results / Procedures / Treatments   Labs (all labs ordered are listed, but only abnormal results are displayed) Labs Reviewed  COMPREHENSIVE METABOLIC PANEL - Abnormal; Notable for the following components:      Result Value   CO2 17 (*)    Glucose, Bld 105 (*)    BUN 52 (*)    Creatinine, Ser 5.22 (*)    Albumin 3.3 (*)    AST 13 (*)    GFR, Estimated 11 (*)    All other components within normal limits  CBC WITH DIFFERENTIAL/PLATELET - Abnormal; Notable for the following components:   RBC 2.47 (*)    Hemoglobin 8.0 (*)    HCT 25.7 (*)    MCV 104.0 (*)    All other components within normal limits  URINALYSIS, ROUTINE W REFLEX MICROSCOPIC - Abnormal; Notable for the following components:   Hgb urine dipstick LARGE (*)    Protein, ur 100 (*)    RBC / HPF >50 (*)    All other components within normal limits  RESPIRATORY PANEL BY RT PCR (FLU A&B, COVID)    HEPATITIS PANEL, ACUTE  HIV ANTIBODY (ROUTINE TESTING W REFLEX)  QUANTIFERON-TB GOLD PLUS    EKG None  Radiology No results found.  Procedures Procedures (including critical care time)  Medications Ordered in ED Medications  methylPREDNISolone sodium succinate (SOLU-MEDROL) 1,000 mg in sodium chloride 0.9 %  50 mL IVPB (has no administration in time range)    ED Course  I have reviewed the triage vital signs and the nursing notes.  Pertinent labs & imaging results that were available during my care of the patient were reviewed by me and considered in my medical decision making (see chart for details).    MDM Rules/Calculators/A&P                          Patient presenting for evaluation of kidney failure.  On exam, patient appears nontoxic.  He has no swelling or tenderness.  Vital signs are stable.  Labs obtained from triage show significant AKI of 5.22.  Hemoglobin slightly low at 8, likely secondary to poor kidney function.  Urine without signs of infection.  Will order Solu-Medrol as recommended by Dr. Carolin Sicks and call hospitalist for admission.  Discussed with Dr. Tana Coast from triad hospitalist service, pt to be admitted.   Final Clinical Impression(s) / ED Diagnoses Final diagnoses:  AKI (acute kidney injury) ([place])  Glomerular disease    Rx / DC Orders ED Discharge Orders    None       Franchot Heidelberg, PA-C 09/26/20 1435    Lacretia Leigh, MD 09/26/20 1452

## 2020-09-26 NOTE — H&P (Addendum)
History and Physical        Hospital Admission Note Date: 09/26/2020  Patient name: Taylor King Medical record number: [id number] Date of birth: 06/25/1951 Age: 69 y.o. Gender: male  PCP: Seward Carol, MD    Patient coming from: Home  I have reviewed all records in the [facility].    Chief Complaint:  Patient called from nephrology office due to abnormal renal function and biopsy results for further work-up  HPI: Patient is a 69 year old male with history of chronic back pain, GERD, recurrent kidney stones, who presented after call from nephrology office due to abnormal creatinine function and biopsy results.  Patient had creatinine of 1.5 in 03/2020.  He had followed up with nephrology, Dr. Carolin Sicks with [place] [facility] due to acute kidney injury, microscopic hematuria.  Patient had renal biopsy done on 09/24/2020 and was found to have ANCA MPO positive.  Patient reported that he did have mild abdominal pain on the left at the kidney biopsy site for an hour, now has resolved.  Currently no abdominal pain, fever chills, hematuria. 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  Patient was advised to come to the hospital for further treatment.   ED work-up/course:  Temp 98.2, respiratory rate 18, pulse 70, BP 138/74 Sodium 138, bicarb 17, glucose 105, BUN 52, creatinine 5.2  Hemoglobin 8.0, hematocrit 25.7  Review of Systems: Positives marked in 'bold' Constitutional: Denies fever, chills, diaphoresis, poor appetite and fatigue.  HEENT: Denies photophobia, eye pain, redness, hearing loss, ear pain, congestion, sore throat, rhinorrhea, sneezing, mouth sores, trouble swallowing, neck pain, neck stiffness and tinnitus.   Respiratory:  Denies SOB, DOE, cough, chest tightness,  and wheezing.   Cardiovascular: Denies chest pain, palpitations and leg swelling.  Gastrointestinal: Denies nausea, vomiting, abdominal pain, diarrhea, constipation, blood in stool and abdominal distention.  Genitourinary: Denies dysuria, urgency, frequency, hematuria, flank pain and difficulty urinating.  Musculoskeletal: Denies myalgias, back pain, joint swelling, arthralgias and gait problem.  Skin: Denies pallor, rash and wound.  Neurological: Denies dizziness, seizures, syncope, weakness, light-headedness, numbness and headaches.  Hematological: Denies adenopathy. Easy bruising, personal or family bleeding history  Psychiatric/Behavioral: Denies suicidal ideation, mood changes, confusion, nervousness, sleep disturbance and agitation  Past Medical History: Past Medical History:  Diagnosis Date  . Arthritis   . Chronic back pain   . Diverticulitis   . Dyspnea    with exertion  . GERD (gastroesophageal reflux disease)    occ  . History of kidney stones   . Peripheral vascular disease ([place])    aaa 39 mm 02-14-17 epic [place]    Past Surgical History:  Procedure Laterality Date  . adenoids removed   age 73  . ANTERIOR LAT LUMBAR FUSION N/A 03/26/2020   Procedure: ANTERIOR LATERAL LUMBAR FUSION (XLIF) LUMBAR TWO-THREE WITH POSTERIOR SPINAL FUSION INTERBODY LUMBAR TWO-THREE;  Surgeon: Melina Schools, MD;  Location: [place];  Service: Orthopedics;  Laterality: N/A;  4 hrs Left tap block with exparel  . COLONOSCOPY WITH PROPOFOL N/A 03/07/2017   Procedure: COLONOSCOPY WITH PROPOFOL;  Surgeon: Ursula Alert  Wynetta Emery, MD;  Location: [facility];  Service: Endoscopy;  Laterality: N/A;  . colonscopy     x 3  . KNEE ARTHROSCOPY  [id number]   left knee  . TONSILLECTOMY  age 58  . TOTAL KNEE ARTHROPLASTY  11/25/2011   Procedure: TOTAL KNEE ARTHROPLASTY;  Surgeon: Newt Minion, MD;  Location: [place];  Service: Orthopedics;  Laterality: Left;  Left Total Knee  Arthroplasty    Medications: Prior to Admission medications   Medication Sig Start Date End Date Taking? Authorizing Provider  acetaminophen (TYLENOL) 500 MG tablet Take 1,000 mg by mouth every 6 (six) hours as needed for moderate pain or headache.    [provider]  atenolol (TENORMIN) 25 MG tablet Take 12.5 mg by mouth daily. 12/14/16   [provider]  vitamin B-12 (CYANOCOBALAMIN) 1000 MCG tablet Take 1,000 mcg by mouth daily.    [provider]    Allergies:   Allergies  Allergen Reactions  . Other Other (See Comments)    Anesthesia medication (pt unsure of which med)- reaction upset stomach. (after colonoscopy)    Social History:  reports that he has quit smoking. His smoking use included cigarettes. He has a 7.50 pack-year smoking history. He has never used smokeless tobacco. He reports that he does not drink alcohol and does not use drugs.  Family History: No family history on file.  Physical Exam: Blood pressure 128/80, pulse (!) 57, temperature 98.2 F (36.8 C), temperature source Oral, resp. rate 17, height 5\' 9"  (1.753 m), weight 79.4 kg, SpO2 98 %. General: Alert, awake, oriented x3, in no acute distress. Eyes: pink conjunctiva,anicteric sclera, pupils equal and reactive to light and accomodation, HEENT: normocephalic, atraumatic, oropharynx clear Neck: supple, no masses or lymphadenopathy, no goiter, no bruits, no JVD CVS: Regular rate and rhythm, without murmurs, rubs or gallops. No lower extremity edema Resp : Clear to auscultation bilaterally, no wheezing, rales or rhonchi. GI : Soft, nontender, nondistended, positive bowel sounds, no masses. No hepatomegaly. No hernia.  Musculoskeletal: No clubbing or cyanosis, positive pedal pulses. No contracture. ROM intact  Neuro: Grossly intact, no focal neurological deficits, strength 5/5 upper and lower extremities bilaterally Psych: alert and oriented x 3, normal mood and affect Skin: no  rashes or lesions, warm and dry   LABS on Admission: I have personally reviewed all the labs and imagings below    Basic Metabolic Panel: Recent Labs  Lab 09/26/20 1243  NA 138  K 4.5  CL 109  CO2 17*  GLUCOSE 105*  BUN 52*  CREATININE 5.22*  CALCIUM 9.3   Liver Function Tests: Recent Labs  Lab 09/26/20 1243  AST 13*  ALT 13  ALKPHOS 81  BILITOT 0.6  PROT 7.1  ALBUMIN 3.3*   No results for input(s): LIPASE, AMYLASE in the last 168 hours. No results for input(s): AMMONIA in the last 168 hours. CBC: Recent Labs  Lab 09/24/20 0644 09/24/20 0644 09/26/20 1243  WBC 8.4  --  9.5  NEUTROABS  --   --  7.0  HGB 7.7*  --  8.0*  HCT 24.6*  --  25.7*  MCV 103.4*   < > 104.0*  PLT 310  --  335   < > = values in this interval not displayed.   Cardiac Enzymes: No results for input(s): CKTOTAL, CKMB, CKMBINDEX, TROPONINI in the last 168 hours. BNP: Invalid input(s): POCBNP CBG: No results for input(s): GLUCAP in the last 168 hours.  Radiological Exams on Admission:  No results found.    EKG: Independently reviewed.  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 Carolin Sicks, ordered acute hepatitis panel, HIV, TB Gold QuantiFERON, uric acid -If the tests are negative, plan is for IV Solu-Medrol, rituximab per nephrology -Avoid any nephrotoxic medications  Active Problems: Non anion gap metabolic acidosis -Likely due to #1, discussed with nephrology, will obtain renal ultrasound due to history of recurrent kidney stones -Placed on IV bicarb, further management per nephrology  Essential hypertension -BP currently stable, sinus bradycardia, hold atenolol    Lumbar spinal  stenosis -Continue Tylenol as needed for back pain  Macrocytic anemia -Hemoglobin 8.0, patient had microscopic hematuria  -Obtain anemia panel, transfuse for hemoglobin less than 7.5 -Currently no obvious bleeding Addendum Repeat hemoglobin 7.3, obtain FOBT Discussed with the patient and obtained consent, transfuse 1 unit packed RBCs  DVT prophylaxis: SCDs  CODE STATUS: Full CODE STATUS, discussed with patient  Consults called: Nephrology  Family Communication: Admission, patients condition and plan of care including tests being ordered have been discussed with the patient and wife at the bedside who indicates understanding and agree with the plan and Code Status  Admission status:   The medical decision making on this patient was of high complexity and the patient is at high risk for clinical deterioration, therefore this is a level 3 admission.  Severity of Illness:      The appropriate patient status for this patient is INPATIENT. Inpatient status is judged to be reasonable and necessary in order to provide the required intensity of service to ensure the patient's safety. The patient's presenting symptoms, physical exam findings, and initial radiographic and laboratory data in the context of their chronic comorbidities is felt to place them at high risk for further clinical deterioration. Furthermore, it is not anticipated that the patient will be medically stable for discharge from the hospital within 2 midnights of admission. The following factors support the patient status of inpatient.   " The patient's presenting symptoms include acute kidney injury with metabolic acidosis, abnormal renal biopsy, anemia. " The worrisome physical exam findings include none " The initial radiographic and laboratory data are worrisome because of abnormal renal function, creatinine 5.22, anemia " The chronic co-morbidities include hypertension, renal stones   * I certify that at the point of  admission it is my clinical judgment that the patient will require inpatient hospital care spanning beyond 2 midnights from the point of admission due to high intensity of service, high risk for further deterioration and high frequency of surveillance required.*    Time Spent on Admission: 60 minutes     Lawsen Arnott M.D. [facility] 09/26/2020, 2:54 PM

## 2020-09-27 LAB — COMPREHENSIVE METABOLIC PANEL
ALT: 12 U/L (ref 0–44)
AST: 11 U/L — ABNORMAL LOW (ref 15–41)
Albumin: 2.7 g/dL — ABNORMAL LOW (ref 3.5–5.0)
Alkaline Phosphatase: 65 U/L (ref 38–126)
Anion gap: 9 (ref 5–15)
BUN: 51 mg/dL — ABNORMAL HIGH (ref 8–23)
CO2: 22 mmol/L (ref 22–32)
Calcium: 8.6 mg/dL — ABNORMAL LOW (ref 8.9–10.3)
Chloride: 108 mmol/L (ref 98–111)
Creatinine, Ser: 5.12 mg/dL — ABNORMAL HIGH (ref 0.61–1.24)
GFR, Estimated: 12 mL/min — ABNORMAL LOW (ref >60)
Glucose, Bld: 86 mg/dL (ref 70–99)
Potassium: 3.9 mmol/L (ref 3.5–5.1)
Sodium: 139 mmol/L (ref 135–145)
Total Bilirubin: 0.9 mg/dL (ref 0.3–1.2)
Total Protein: 6 g/dL — ABNORMAL LOW (ref 6.5–8.1)

## 2020-09-27 LAB — FOLATE: Folate: 6.8 ng/mL (ref >5.9)

## 2020-09-27 LAB — VITAMIN B12: Vitamin B-12: 716 pg/mL (ref 180–914)

## 2020-09-27 LAB — CBC
HCT: 23.7 % — ABNORMAL LOW (ref 39.0–52.0)
Hemoglobin: 7.7 g/dL — ABNORMAL LOW (ref 13.0–17.0)
MCH: 32.2 pg (ref 26.0–34.0)
MCHC: 32.5 g/dL (ref 30.0–36.0)
MCV: 99.2 fL (ref 80.0–100.0)
Platelets: 249 10*3/uL (ref 150–400)
RBC: 2.39 MIL/uL — ABNORMAL LOW (ref 4.22–5.81)
RDW: 14.3 % (ref 11.5–15.5)
WBC: 8 10*3/uL (ref 4.0–10.5)
nRBC: 0 % (ref 0.0–0.2)

## 2020-09-27 LAB — RETICULOCYTES
Immature Retic Fract: 20.3 % — ABNORMAL HIGH (ref 2.3–15.9)
RBC.: 2.39 MIL/uL — ABNORMAL LOW (ref 4.22–5.81)
Retic Count, Absolute: 48 10*3/uL (ref 19.0–186.0)
Retic Ct Pct: 2 % (ref 0.4–3.1)

## 2020-09-27 LAB — BPAM RBC
Blood Product Expiration Date: 202112142359
ISSUE DATE / TIME: 202111132037
Unit Type and Rh: 5100

## 2020-09-27 LAB — IRON AND TIBC
Iron: 58 ug/dL (ref 45–182)
Saturation Ratios: 21 % (ref 17.9–39.5)
TIBC: 276 ug/dL (ref 250–450)
UIBC: 218 ug/dL

## 2020-09-27 LAB — TYPE AND SCREEN
ABO/RH(D): O POS
Antibody Screen: NEGATIVE
Unit division: 0

## 2020-09-27 LAB — FERRITIN: Ferritin: 231 ng/mL (ref 24–336)

## 2020-09-27 LAB — PHOSPHORUS: Phosphorus: 4.9 mg/dL — ABNORMAL HIGH (ref 2.5–4.6)

## 2020-09-27 LAB — HEMOGLOBIN AND HEMATOCRIT, BLOOD
HCT: 24.1 % — ABNORMAL LOW (ref 39.0–52.0)
Hemoglobin: 8 g/dL — ABNORMAL LOW (ref 13.0–17.0)

## 2020-09-27 MED ORDER — SODIUM CHLORIDE 0.9 % IV SOLN
250.0000 mg | Freq: Every day | INTRAVENOUS | Status: AC
  Administered 2020-09-27 – 2020-09-28 (×2): 250 mg via INTRAVENOUS
  Filled 2020-09-27 (×2): qty 20

## 2020-09-27 NOTE — Progress Notes (Addendum)
[facility] NEPHROLOGY PROGRESS NOTE  Assessment/ Plan: Pt is a 69 y.o. yo male with history of bilateral kidney stone, hematuria, proteinuria, AAA, initially referred by urologist for AKI who underwent kidney biopsy on 11/11 consistent with necrotizing crescentic ANCA vasculitis now admitted for treatment with immunosuppression.  He follows with me at [place].  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  He does not have any systemic symptoms.  Follow-up final biopsy result. Gold QuantiFERON test and hepatitis panel is currently pending.  The plan is to start immunosuppression after those results.  If hepatitis panel comes back negative (low risk) then I would go ahead and start Solu-Medrol because the urgency of the treatment.  Apparently QuantiFERON test takes longer time to come back (low risk of TB infection), will await the results before rituximab. Since he does not have any systemic symptoms, not planning for plasma exchange. Consulted pharmacist to start Solu-Medrol 1 g daily for 3 days and rituximab 1 g. Start vitamin D 1000 unit every day, Pepcid 10 mg. Plan for Bactrim 3 times a week as prophylaxis when he is on immunosuppression. Patient agreed with the plan above.  #Acute kidney injury, nonoliguric: Treatment as above.  Monitor BMP.  Avoid nephrotoxins.  Strict ins and out.  #Metabolic acidosis: Treated with IV bicarbonate, lab improved.  Discontinue IVF.  Increase oral intake.  #Anemia probably due to vasculitis: Iron saturation 21% with low hemoglobin.  FOBT positive, may need GI consult will defer to primary team.  Received 1 unit of blood transfusion.  I will order IV iron.  Monitor CBC.  #Hypertension: Resume atenolol.  Monitor blood pressure.  #History of uric acid nephrolithiasis: Checking uric acid level.  Apparently he is off of  allopurinol per his urologist.  Subjective: Seen and examined at bedside.  Received blood transfusion.  Denies nausea vomiting chest pain shortness of breath.  Primary team at bedside, discussed the plan together. Objective Vital signs in last 24 hours: Vitals:   09/26/20 2040 09/26/20 2100 09/27/20 0015 09/27/20 0517  BP: 133/88 140/76 125/75 119/67  Pulse: 76 60 60 62  Resp: 18 17 18 18   Temp: 98.4 F (36.9 C) 98.1 F (36.7 C) 98.4 F (36.9 C) 98.6 F (37 C)  TempSrc: Oral Oral Oral Oral  SpO2: 99% 98% 97% 96%  Weight:      Height:       Weight change:   Intake/Output Summary (Last 24 hours) at 09/27/2020 1100 Last data filed at 09/27/2020 0400 Gross per 24 hour  Intake 1342.43 ml  Output 1070 ml  Net 272.43 ml       Labs: Basic Metabolic Panel: Recent Labs  Lab 09/26/20 1243 09/27/20 0402  NA 138 139  K 4.5 3.9  CL 109 108  CO2 17* 22  GLUCOSE 105* 86  BUN 52* 51*  CREATININE 5.22* 5.12*  CALCIUM 9.3 8.6*  PHOS  --  4.9*   Liver Function Tests: Recent Labs  Lab 09/26/20 1243 09/27/20 0402  AST 13* 11*  ALT 13 12  ALKPHOS 81 65  BILITOT 0.6 0.9  PROT 7.1 6.0*  ALBUMIN 3.3* 2.7*   No results for input(s): LIPASE, AMYLASE in the last 168 hours. No results for input(s): AMMONIA in the last 168 hours. CBC: Recent Labs  Lab 09/24/20 0644 09/24/20 0644 09/26/20 1243 09/26/20 1533 09/27/20 0402  WBC 8.4   < > 9.5 9.2 8.0  NEUTROABS  --   --  7.0 6.3  --   HGB 7.7*   < > 8.0* 7.3* 7.7*  HCT 24.6*   < > 25.7* 23.0* 23.7*  MCV 103.4*  --  104.0* 104.1* 99.2  PLT 310   < > 335 301 249   < > = values in this interval not displayed.   Cardiac Enzymes: No results for input(s): CKTOTAL, CKMB, CKMBINDEX, TROPONINI in the last 168 hours. CBG: No results for input(s): GLUCAP in the last 168 hours.  Iron Studies:  Recent Labs    09/27/20 0402  IRON 58  TIBC 276  FERRITIN 231   Studies/Results: US RENAL  Result Date: 09/26/2020 CLINICAL  DATA:  69 year old male with acute renal insufficiency. EXAM: RENAL / URINARY TRACT ULTRASOUND COMPLETE COMPARISON:  Renal ultrasound dated 09/17/2020. FINDINGS: Right Kidney: Renal measurements: 11.4 x 5.3 x 5.8 cm = volume: 175 mL. Mild parenchyma atrophy and increased echogenicity. No hydronephrosis or shadowing stone. Several cysts measure up to 2.7 cm. Left Kidney: Renal measurements: 11.4 x 5.3 x 4.6 cm = volume: 139 mL. Mild parenchyma atrophy and increased echogenicity. No hydronephrosis or shadowing stone. Bladder: Appears normal for degree of bladder distention. Other: None. IMPRESSION: Echogenic kidneys in keeping with medical renal disease. No hydronephrosis or shadowing stone. Electronically Signed   By: Anner Crete M.D.   On: 09/26/2020 17:03    Medications: Infusions: . sodium bicarbonate in 1/4 NS 1000 mL infusion 75 mL/hr at 09/27/20 0555    Scheduled Medications: . sodium chloride   Intravenous Once  . cholecalciferol  1,000 Units Oral Daily  . famotidine  10 mg Oral Daily    have reviewed scheduled and prn medications.  Physical Exam: General:NAD, comfortable Heart:RRR, s1s2 nl Lungs:clear b/l, no crackle Abdomen:soft, Non-tender, non-distended Extremities:No edema Neurology: Alert, awake and following commands.  Caitlynn Ju Prasad Brelan Hannen 09/27/2020,11:00 AM  LOS: 1 day  Pager: [phone]

## 2020-09-27 NOTE — Progress Notes (Signed)
PROGRESS NOTE    Taylor King  EGB:[id number] DOB: [birth year] DOA: 09/26/2020 PCP: Seward Carol, MD    Brief Narrative:  Taylor King is a 69 year old male with past medical history significant for chronic back pain, GERD, recurrent kidney stones who presented to the ED after being directed by his nephrologist, Dr. Carolin Sicks for abnormal creatinine and biopsy results.  Creatinine 1.5 in May 2021 which has subsequently increased.  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  In the ED, temperature 98.2, RR 18, HR 70, BP 138/74.  Sodium 138, bicarb 17, glucose 105, BUN 52, creatinine 5.2, hemoglobin 8.0, hematocrit 25.7.  TRH was consulted for admission for acute renal failure secondary to glomerulonephritis.   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  Renal ultrasound with echogenic kidneys consistent with medical renal disease, no hydronephrosis or stone.  HIV nonreactive. --Cr 5.22>5.12 (1.56 03/2020) --acute hepatitis panel: pending --QuantiFERON gold: Pending --Solu-Medrol 1 g IV daily x3 days --Plan to start rituximab once QuantiFERON test results --Vitamin D 1000 units qday --Pepcid --Bactrim 3 times weekly once on immunosuppression --Strict I's and O's --monitor renal function closely daily  Non-anion gap metabolic acidosis Improved/resolved following IV bicarbonate drip, now discontinued. --Monitor BMP daily  Essential hypertension BP 119/67 w/ HR 62.  Holding home atenolol. --continue to monitor BP  Lumbar spinal  stenosis Follows with orthopedic surgery, Dr. Melina Schools outpatient.  Currently undergoing outpatient physical therapy.  Uses Tylenol and Goody powder as needed. --Continue Tylenol as needed  Macrocytic anemia Hemoglobin 8.0 and MCV 104.0 on admission. TIBC 276, folate 6.8, B12 716, ferritin 233.  FOBT positive, but denies any dark/tarry stools. S/p 1u pRBC 11/13.  Previously seen by [facility] gastroenterology, Dr. Earle Gell with colonoscopy 2018 with findings 12 mm semipedunculated polyp proximal cecum which was resected and 2 sessile polyps in ascending colon; removed; with recommendations repeat colonoscopy in 3 years.  Uses Goody's powder as needed --Hgb 8.0>7.3>7.7 --IV iron ordered today --H/H q12h --transfuse to maintain hemoglobin >7.0 --Will consider GI eval dependent on Hgb trend; but is due for repeat colonoscopy  History of recurrent uric acid nephrolithiasis Was previously on allopurinol.  No stones appreciated on renal ultrasound.   DVT prophylaxis: SCDs, ambulation Code Status: Full code Family Communication: No family present at bedside  Disposition Plan:  Status is: Inpatient  Remains inpatient appropriate because:Ongoing diagnostic testing needed not appropriate for outpatient work up, IV treatments appropriate due to intensity of illness or inability to take PO and Inpatient level of care appropriate due to severity of illness   Dispo: The patient is from: Home              Anticipated d/c is to: Home              Anticipated d/c date is: > 3 days              Patient currently is not medically stable to d/c.   Consultants:   Nephrology, Dr. Carolin Sicks  Procedures:   Renal ultrasound  Antimicrobials:   None   Subjective: Patient seen and examined bedside,  resting comfortably.  Nephrology present.  Awaiting acute hepatitis panel and QuantiFERON gold prior to initiating rituximab.  No other complaints or concerns at this time.  Denies dizziness, no  headache, no chest pain, no palpitations, no shortness of breath, no abdominal pain, no weakness, no fatigue, no dizziness, no fever/chills/night sweats, no nausea/vomiting/diarrhea.  No acute events overnight per nursing staff.  Objective: Vitals:   09/26/20 2040 09/26/20 2100 09/27/20 0015 09/27/20 0517  BP: 133/88 140/76 125/75 119/67  Pulse: 76 60 60 62  Resp: 18 17 18 18   Temp: 98.4 F (36.9 C) 98.1 F (36.7 C) 98.4 F (36.9 C) 98.6 F (37 C)  TempSrc: Oral Oral Oral Oral  SpO2: 99% 98% 97% 96%  Weight:      Height:        Intake/Output Summary (Last 24 hours) at 09/27/2020 1133 Last data filed at 09/27/2020 0400 Gross per 24 hour  Intake 1342.43 ml  Output 1070 ml  Net 272.43 ml   Filed Weights   09/26/20 1224  Weight: 79.4 kg    Examination:  General exam: Appears calm and comfortable  Respiratory system: Clear to auscultation. Respiratory effort normal. Cardiovascular system: S1 & S2 heard, RRR. No JVD, murmurs, rubs, gallops or clicks. No pedal edema. Gastrointestinal system: Abdomen is nondistended, soft and nontender. No organomegaly or masses felt. Normal bowel sounds heard. Central nervous system: Alert and oriented. No focal neurological deficits. Extremities: Symmetric 5 x 5 power. Skin: No rashes, lesions or ulcers Psychiatry: Judgement and insight appear normal. Mood & affect appropriate.     Data Reviewed: I have personally reviewed following labs and imaging studies  CBC: Recent Labs  Lab 09/24/20 0644 09/26/20 1243 09/26/20 1533 09/27/20 0402  WBC 8.4 9.5 9.2 8.0  NEUTROABS  --  7.0 6.3  --   HGB 7.7* 8.0* 7.3* 7.7*  HCT 24.6* 25.7* 23.0* 23.7*  MCV 103.4* 104.0* 104.1* 99.2  PLT 310 335 301 956   Basic Metabolic Panel: Recent Labs  Lab 09/26/20 1243 09/27/20 0402  NA 138 139  K 4.5 3.9  CL 109 108  CO2 17* 22  GLUCOSE 105* 86  BUN 52* 51*  CREATININE 5.22* 5.12*  CALCIUM 9.3 8.6*  PHOS  --  4.9*   GFR: Estimated  Creatinine Clearance: 13.8 mL/min (A) (by C-G formula based on SCr of 5.12 mg/dL (H)). Liver Function Tests: Recent Labs  Lab 09/26/20 1243 09/27/20 0402  AST 13* 11*  ALT 13 12  ALKPHOS 81 65  BILITOT 0.6 0.9  PROT 7.1 6.0*  ALBUMIN 3.3* 2.7*   No results for input(s): LIPASE, AMYLASE in the last 168 hours. No results for input(s): AMMONIA in the last 168 hours. Coagulation Profile: Recent Labs  Lab 09/24/20 0644  INR 1.0   Cardiac Enzymes: No results for input(s): CKTOTAL, CKMB, CKMBINDEX, TROPONINI in the last 168 hours. BNP (last 3 results) No results for input(s): PROBNP in the last 8760 hours. HbA1C: No results for input(s): HGBA1C in the last 72 hours. CBG: No results for input(s): GLUCAP in the last 168 hours. Lipid Profile: No results for input(s): CHOL, HDL, LDLCALC, TRIG, CHOLHDL, LDLDIRECT in the last 72 hours. Thyroid Function Tests: No results for input(s): TSH, T4TOTAL, FREET4, T3FREE, THYROIDAB in the last 72 hours. Anemia Panel: Recent Labs    09/27/20 0402  VITAMINB12 716  FOLATE 6.8  FERRITIN 231  TIBC 276  IRON 58  RETICCTPCT 2.0   Sepsis Labs: No results for input(s): PROCALCITON, LATICACIDVEN  in the last 168 hours.  Recent Results (from the past 240 hour(s))  Respiratory Panel by RT PCR (Flu A&B, Covid) - Nasopharyngeal Swab     Status: None   Collection Time: 09/26/20  2:03 PM   Specimen: Nasopharyngeal Swab  Result Value Ref Range Status   SARS Coronavirus 2 by RT PCR NEGATIVE NEGATIVE Final    Comment: (NOTE) SARS-CoV-2 target nucleic acids are NOT DETECTED.  The SARS-CoV-2 RNA is generally detectable in upper respiratoy specimens during the acute phase of infection. The lowest concentration of SARS-CoV-2 viral copies this assay can detect is 131 copies/mL. A negative result does not preclude SARS-Cov-2 infection and should not be used as the sole basis for treatment or other patient management decisions. A negative result may  occur with  improper specimen collection/handling, submission of specimen other than nasopharyngeal swab, presence of viral mutation(s) within the areas targeted by this assay, and inadequate number of viral copies (<131 copies/mL). A negative result must be combined with clinical observations, patient history, and epidemiological information. The expected result is Negative.  Fact Sheet for Patients:  PinkCheek.be  Fact Sheet for Healthcare Providers:  GravelBags.it  This test is no t yet approved or cleared by the [place] FDA and  has been authorized for detection and/or diagnosis of SARS-CoV-2 by FDA under an Emergency Use Authorization (EUA). This EUA will remain  in effect (meaning this test can be used) for the duration of the COVID-19 declaration under Section 564(b)(1) of the Act, 21 U.S.C. section 360bbb-3(b)(1), unless the authorization is terminated or revoked sooner.     Influenza A by PCR NEGATIVE NEGATIVE Final   Influenza B by PCR NEGATIVE NEGATIVE Final    Comment: (NOTE) The Xpert Xpress SARS-CoV-2/FLU/RSV assay is intended as an aid in  the diagnosis of influenza from Nasopharyngeal swab specimens and  should not be used as a sole basis for treatment. Nasal washings and  aspirates are unacceptable for Xpert Xpress SARS-CoV-2/FLU/RSV  testing.  Fact Sheet for Patients: PinkCheek.be  Fact Sheet for Healthcare Providers: GravelBags.it  This test is not yet approved or cleared by the [place] FDA and  has been authorized for detection and/or diagnosis of SARS-CoV-2 by  FDA under an Emergency Use Authorization (EUA). This EUA will remain  in effect (meaning this test can be used) for the duration of the  Covid-19 declaration under Section 564(b)(1) of the Act, 21  U.S.C. section 360bbb-3(b)(1), unless the authorization is  terminated or  revoked. Performed at [facility], [place] [address], [place], [place] [address]          Radiology Studies: US RENAL  Result Date: 09/26/2020 CLINICAL DATA:  69 year old male with acute renal insufficiency. EXAM: RENAL / URINARY TRACT ULTRASOUND COMPLETE COMPARISON:  Renal ultrasound dated 09/17/2020. FINDINGS: Right Kidney: Renal measurements: 11.4 x 5.3 x 5.8 cm = volume: 175 mL. Mild parenchyma atrophy and increased echogenicity. No hydronephrosis or shadowing stone. Several cysts measure up to 2.7 cm. Left Kidney: Renal measurements: 11.4 x 5.3 x 4.6 cm = volume: 139 mL. Mild parenchyma atrophy and increased echogenicity. No hydronephrosis or shadowing stone. Bladder: Appears normal for degree of bladder distention. Other: None. IMPRESSION: Echogenic kidneys in keeping with medical renal disease. No hydronephrosis or shadowing stone. Electronically Signed   By: Anner Crete M.D.   On: 09/26/2020 17:03        Scheduled Meds: . sodium chloride   Intravenous Once  . cholecalciferol  1,000 Units Oral  Daily  . famotidine  10 mg Oral Daily   Continuous Infusions: . ferric gluconate (FERRLECIT/NULECIT) IV       LOS: 1 day    Time spent: 36 minutes spent on chart review, discussion with nursing staff, consultants, updating family and interview/physical exam; more than 50% of that time was spent in counseling and/or coordination of care.    Renleigh Ouellet J [place] DO [facility] Available via Epic secure chat 7am-7pm After these hours, please refer to coverage provider listed on [email or web address] 09/27/2020, 11:33 AM

## 2020-09-27 NOTE — Progress Notes (Signed)
Blood transfusion completed at the said time. No rxn observed. Patient tolerated it well.

## 2020-09-28 LAB — RENAL FUNCTION PANEL
Albumin: 2.7 g/dL — ABNORMAL LOW (ref 3.5–5.0)
Anion gap: 11 (ref 5–15)
BUN: 57 mg/dL — ABNORMAL HIGH (ref 8–23)
CO2: 21 mmol/L — ABNORMAL LOW (ref 22–32)
Calcium: 8.3 mg/dL — ABNORMAL LOW (ref 8.9–10.3)
Chloride: 106 mmol/L (ref 98–111)
Creatinine, Ser: 5.35 mg/dL — ABNORMAL HIGH (ref 0.61–1.24)
GFR, Estimated: 11 mL/min — ABNORMAL LOW (ref >60)
Glucose, Bld: 91 mg/dL (ref 70–99)
Phosphorus: 4.9 mg/dL — ABNORMAL HIGH (ref 2.5–4.6)
Potassium: 3.7 mmol/L (ref 3.5–5.1)
Sodium: 138 mmol/L (ref 135–145)

## 2020-09-28 LAB — CBC
HCT: 24.2 % — ABNORMAL LOW (ref 39.0–52.0)
Hemoglobin: 7.8 g/dL — ABNORMAL LOW (ref 13.0–17.0)
MCH: 32.4 pg (ref 26.0–34.0)
MCHC: 32.2 g/dL (ref 30.0–36.0)
MCV: 100.4 fL — ABNORMAL HIGH (ref 80.0–100.0)
Platelets: 274 10*3/uL (ref 150–400)
RBC: 2.41 MIL/uL — ABNORMAL LOW (ref 4.22–5.81)
RDW: 14.1 % (ref 11.5–15.5)
WBC: 7.9 10*3/uL (ref 4.0–10.5)
nRBC: 0 % (ref 0.0–0.2)

## 2020-09-28 LAB — HEPATITIS PANEL, ACUTE
HCV Ab: <0.1 s/co ratio — AB (ref 0.0–0.9)
Hep A IgM: NEGATIVE — AB
Hep B C IgM: NEGATIVE — AB
Hepatitis B Surface Ag: NEGATIVE — AB

## 2020-09-28 LAB — QUANTIFERON-TB GOLD PLUS (RQFGPL)
QuantiFERON Mitogen Value: 3.8 IU/mL
QuantiFERON Nil Value: 0 IU/mL
QuantiFERON TB1 Ag Value: 0 IU/mL
QuantiFERON TB2 Ag Value: 0 IU/mL

## 2020-09-28 LAB — QUANTIFERON-TB GOLD PLUS: QuantiFERON-TB Gold Plus: NEGATIVE

## 2020-09-28 MED ORDER — SODIUM CHLORIDE 0.9 % IV SOLN
500.0000 mg | INTRAVENOUS | Status: AC
  Administered 2020-09-28 – 2020-09-30 (×3): 500 mg via INTRAVENOUS
  Filled 2020-09-28 (×3): qty 4

## 2020-09-28 NOTE — Progress Notes (Signed)
Reviewed case with primary Nephrologist  Dr Carolin Sicks    We both agreed that due to age , elevated creatinine of 5.2 mg/dl at presentation , and some unfavorable characteristics concerning for some chronic scarring emerging on biopsy the prognosis may be poor. We therefore have decided to choose Cytoxan to treat Mr [facility].   I am inclined to use a dose IV every month for 3 months of 0.5g/m2.  I will check with pharmacy on dosing and scheduling

## 2020-09-28 NOTE — Progress Notes (Signed)
PROGRESS NOTE    Taylor King  GQQ:[id number] DOB: February 13, 1951 DOA: 09/26/2020 PCP: Seward Carol, MD    Brief Narrative:  Taylor King is a 69 year old male with past medical history significant for chronic back pain, GERD, recurrent kidney stones who presented to the ED after being directed by his nephrologist, Dr. Carolin Sicks for abnormal creatinine and biopsy results.  Creatinine 1.5 in May 2021 which has subsequently increased.  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  In the ED, temperature 98.2, RR 18, HR 70, BP 138/74.  Sodium 138, bicarb 17, glucose 105, BUN 52, creatinine 5.2, hemoglobin 8.0, hematocrit 25.7.  TRH was consulted for admission for acute renal failure secondary to glomerulonephritis.   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  Renal ultrasound with echogenic kidneys consistent with medical renal disease, no hydronephrosis or stone.  HIV nonreactive. --Cr 5.22>5.12>5.35 (1.56 03/2020) --acute hepatitis panel: pending --QuantiFERON gold: Pending --Solu-Medrol 1 g IV daily x3 days once Hepatitis panel returns back negative per nephrology --Plan to start rituximab once QuantiFERON test results --Vitamin D 1000 units qday --Pepcid --Bactrim 3 times weekly once on immunosuppression --Strict I's and O's --monitor renal function closely daily  Non-anion gap metabolic acidosis Improved/resolved following IV bicarbonate drip, now discontinued. --Monitor BMP daily  Essential hypertension BP 119/67 w/ HR 62.  Holding  home atenolol. --continue to monitor BP  Lumbar spinal stenosis Follows with orthopedic surgery, Dr. Melina Schools outpatient.  Currently undergoing outpatient physical therapy.  Uses Tylenol and Goody powder as needed. --Continue Tylenol as needed  Macrocytic anemia Hemoglobin 8.0 and MCV 104.0 on admission. TIBC 276, folate 6.8, B12 716, ferritin 233.  FOBT positive, but denies any dark/tarry stools. S/p 1u pRBC 11/13.  Previously seen by [facility] gastroenterology, Dr. Earle Gell with colonoscopy 2018 with findings 12 mm semipedunculated polyp proximal cecum which was resected and 2 sessile polyps in ascending colon; removed; with recommendations repeat colonoscopy in 3 years.  Uses Goody's powder as needed --Hgb 8.0>7.3>7.7>8.0>7.8 --repeat IV iron ordered today --CBC daily --transfuse to maintain hemoglobin >7.0 --Patient states that he knows to follow-up with his primary gastroenterologist for this  History of recurrent uric acid nephrolithiasis Was previously on allopurinol.  No stones appreciated on renal ultrasound.   DVT prophylaxis: SCDs, ambulation Code Status: Full code Family Communication: No family present at bedside  Disposition Plan:  Status is: Inpatient  Remains inpatient appropriate because:Ongoing diagnostic testing needed not appropriate for outpatient work up, IV treatments appropriate due to intensity of illness or inability to take PO and Inpatient level of care appropriate due to severity of illness   Dispo: The patient is from: Home              Anticipated d/c is to: Home              Anticipated d/c date is: > 3 days              Patient currently is not medically stable to d/c.   Consultants:   Nephrology, Dr. Carolin Sicks, Dr. Justin Mend  Procedures:   Renal ultrasound  Antimicrobials:  None   Subjective: Patient seen and examined bedside, resting comfortably.  Wants to know why we have not started steroids or immunotherapy yet.  Discussed with  patient nephrology needs to have results of acute hepatitis panel and QuantiFERON gold prior to initiation.  "Feels like I am in expensive hotel room".  Receiving IV iron infusion this morning.  No other questions or concerns at this time.  Denies headache, no dizziness, no chest pain, no palpitations, no shortness of breath, no abdominal pain, no weakness, no fatigue, no dizziness, no fever/chills/night sweats, no nausea/vomiting/diarrhea.  No acute events overnight per nursing staff.  Objective: Vitals:   09/27/20 1243 09/27/20 1840 09/27/20 2334 09/28/20 0735  BP: 131/72 135/79 (!) 117/91 139/71  Pulse: 60 63 67   Resp: 16 16 16 16   Temp: 97.7 F (36.5 C) (!) 97.4 F (36.3 C) 98.2 F (36.8 C) 98.3 F (36.8 C)  TempSrc: Oral Oral Oral Oral  SpO2: 94% 97% 97% 98%  Weight:      Height:       No intake or output data in the 24 hours ending 09/28/20 1106 Filed Weights   09/26/20 1224  Weight: 79.4 kg    Examination:  General exam: Appears calm and comfortable  Respiratory system: Clear to auscultation. Respiratory effort normal. Cardiovascular system: S1 & S2 heard, RRR. No JVD, murmurs, rubs, gallops or clicks. No pedal edema. Gastrointestinal system: Abdomen is nondistended, soft and nontender. No organomegaly or masses felt. Normal bowel sounds heard. Central nervous system: Alert and oriented. No focal neurological deficits. Extremities: Symmetric 5 x 5 power. Skin: No rashes, lesions or ulcers Psychiatry: Judgement and insight appear normal. Mood & affect appropriate.     Data Reviewed: I have personally reviewed following labs and imaging studies  CBC: Recent Labs  Lab 09/24/20 0644 09/24/20 0644 09/26/20 1243 09/26/20 1533 09/27/20 0402 09/27/20 2114 09/28/20 0101  WBC 8.4  --  9.5 9.2 8.0  --  7.9  NEUTROABS  --   --  7.0 6.3  --   --   --   HGB 7.7*   < > 8.0* 7.3* 7.7* 8.0* 7.8*  HCT 24.6*   < > 25.7* 23.0* 23.7* 24.1* 24.2*  MCV 103.4*  --  104.0* 104.1*  99.2  --  100.4*  PLT 310  --  335 301 249  --  274   < > = values in this interval not displayed.   Basic Metabolic Panel: Recent Labs  Lab 09/26/20 1243 09/27/20 0402 09/28/20 0101  NA 138 139 138  K 4.5 3.9 3.7  CL 109 108 106  CO2 17* 22 21*  GLUCOSE 105* 86 91  BUN 52* 51* 57*  CREATININE 5.22* 5.12* 5.35*  CALCIUM 9.3 8.6* 8.3*  PHOS  --  4.9* 4.9*   GFR: Estimated Creatinine Clearance: 13.2 mL/min (A) (by C-G formula based on SCr of 5.35 mg/dL (H)). Liver Function Tests: Recent Labs  Lab 09/26/20 1243 09/27/20 0402 09/28/20 0101  AST 13* 11*  --   ALT 13 12  --   ALKPHOS 81 65  --   BILITOT 0.6 0.9  --   PROT 7.1 6.0*  --   ALBUMIN 3.3* 2.7* 2.7*   No results for input(s): LIPASE, AMYLASE in the last 168 hours. No results for input(s): AMMONIA in the last 168 hours. Coagulation Profile: Recent Labs  Lab 09/24/20 0644  INR 1.0   Cardiac Enzymes: No results for input(s): CKTOTAL, CKMB, CKMBINDEX, TROPONINI in  the last 168 hours. BNP (last 3 results) No results for input(s): PROBNP in the last 8760 hours. HbA1C: No results for input(s): HGBA1C in the last 72 hours. CBG: No results for input(s): GLUCAP in the last 168 hours. Lipid Profile: No results for input(s): CHOL, HDL, LDLCALC, TRIG, CHOLHDL, LDLDIRECT in the last 72 hours. Thyroid Function Tests: No results for input(s): TSH, T4TOTAL, FREET4, T3FREE, THYROIDAB in the last 72 hours. Anemia Panel: Recent Labs    09/27/20 0402  VITAMINB12 716  FOLATE 6.8  FERRITIN 231  TIBC 276  IRON 58  RETICCTPCT 2.0   Sepsis Labs: No results for input(s): PROCALCITON, LATICACIDVEN in the last 168 hours.  Recent Results (from the past 240 hour(s))  Respiratory Panel by RT PCR (Flu A&B, Covid) - Nasopharyngeal Swab     Status: None   Collection Time: 09/26/20  2:03 PM   Specimen: Nasopharyngeal Swab  Result Value Ref Range Status   SARS Coronavirus 2 by RT PCR NEGATIVE NEGATIVE Final    Comment:  (NOTE) SARS-CoV-2 target nucleic acids are NOT DETECTED.  The SARS-CoV-2 RNA is generally detectable in upper respiratoy specimens during the acute phase of infection. The lowest concentration of SARS-CoV-2 viral copies this assay can detect is 131 copies/mL. A negative result does not preclude SARS-Cov-2 infection and should not be used as the sole basis for treatment or other patient management decisions. A negative result may occur with  improper specimen collection/handling, submission of specimen other than nasopharyngeal swab, presence of viral mutation(s) within the areas targeted by this assay, and inadequate number of viral copies (<131 copies/mL). A negative result must be combined with clinical observations, patient history, and epidemiological information. The expected result is Negative.  Fact Sheet for Patients:  PinkCheek.be  Fact Sheet for Healthcare Providers:  GravelBags.it  This test is no t yet approved or cleared by the [place] FDA and  has been authorized for detection and/or diagnosis of SARS-CoV-2 by FDA under an Emergency Use Authorization (EUA). This EUA will remain  in effect (meaning this test can be used) for the duration of the COVID-19 declaration under Section 564(b)(1) of the Act, 21 U.S.C. section 360bbb-3(b)(1), unless the authorization is terminated or revoked sooner.     Influenza A by PCR NEGATIVE NEGATIVE Final   Influenza B by PCR NEGATIVE NEGATIVE Final    Comment: (NOTE) The Xpert Xpress SARS-CoV-2/FLU/RSV assay is intended as an aid in  the diagnosis of influenza from Nasopharyngeal swab specimens and  should not be used as a sole basis for treatment. Nasal washings and  aspirates are unacceptable for Xpert Xpress SARS-CoV-2/FLU/RSV  testing.  Fact Sheet for Patients: PinkCheek.be  Fact Sheet for Healthcare  Providers: GravelBags.it  This test is not yet approved or cleared by the [place] FDA and  has been authorized for detection and/or diagnosis of SARS-CoV-2 by  FDA under an Emergency Use Authorization (EUA). This EUA will remain  in effect (meaning this test can be used) for the duration of the  Covid-19 declaration under Section 564(b)(1) of the Act, 21  U.S.C. section 360bbb-3(b)(1), unless the authorization is  terminated or revoked. Performed at [facility], [place] [address], [place], [place] [address]          Radiology Studies: US RENAL  Result Date: 09/26/2020 CLINICAL DATA:  69 year old male with acute renal insufficiency. EXAM: RENAL / URINARY TRACT ULTRASOUND COMPLETE COMPARISON:  Renal ultrasound dated 09/17/2020. FINDINGS: Right Kidney: Renal measurements: 11.4 x 5.3  x 5.8 cm = volume: 175 mL. Mild parenchyma atrophy and increased echogenicity. No hydronephrosis or shadowing stone. Several cysts measure up to 2.7 cm. Left Kidney: Renal measurements: 11.4 x 5.3 x 4.6 cm = volume: 139 mL. Mild parenchyma atrophy and increased echogenicity. No hydronephrosis or shadowing stone. Bladder: Appears normal for degree of bladder distention. Other: None. IMPRESSION: Echogenic kidneys in keeping with medical renal disease. No hydronephrosis or shadowing stone. Electronically Signed   By: Anner Crete M.D.   On: 09/26/2020 17:03        Scheduled Meds: . sodium chloride   Intravenous Once  . cholecalciferol  1,000 Units Oral Daily  . famotidine  10 mg Oral Daily   Continuous Infusions:    LOS: 2 days    Time spent: 35 minutes spent on chart review, discussion with nursing staff, consultants, updating family and interview/physical exam; more than 50% of that time was spent in counseling and/or coordination of care.    Burl Tauzin J [place] DO [facility] Available via Epic secure chat 7am-7pm After these hours, please refer to  coverage provider listed on [email or web address] 09/28/2020, 11:06 AM

## 2020-09-28 NOTE — Progress Notes (Signed)
Discussed case with Dr Carolin Sicks   :  Patient with severe histological features will proceed with pulse steroids  Prior to the quantiferon test results  Will give 500 mg solumedrol x 3 days  Then will dose steroids based on reduced dose regimen ( PLEXIVAS trial 2020 - non inferiority to standard dose steroids )

## 2020-09-28 NOTE — Progress Notes (Signed)
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  He is admitted for treatment for immunosuppression.  He is followed by Dr. Carolin Sicks.  We are awaiting the QuantiFERON test in order to initiate rituximab.  He has no systemic symptoms therefore plasmapheresis is not indicated.  He will start Solu-Medrol 1 g for 3 days and will get rituximab 1 g once the QuantiFERON test is negative.  He will continue Bactrim 3 times a week, vitamin D 1000 units every day and Pepcid 10 mg daily.  Blood pressure 117/91 pulse 87 temperature 98.2 O2 sats 97% room air  Sodium 138 potassium 2.7 chloride 106 CO2 21 BUN 37 creatinine 5.34 glucose 91 calcium 8.3 phosphorus 4.9 albumin 2.7 iron saturations 21% hemoglobin 7.8  Objective:  Vital signs in last 24 hours:  Temp:  [97.4 F (36.3 C)-98.2 F (36.8 C)] 98.2 F (36.8 C) (11/14 2334) Pulse Rate:  [60-67] 67 (11/14 2334) Resp:  [16] 16 (11/14 2334) BP: (117-135)/(72-91) 117/91 (11/14 2334) SpO2:  [94 %-97 %] 97 % (11/14 2334)  Weight change:  Filed Weights   09/26/20 1224  Weight: 79.4 kg    Intake/Output: I/O last 3 completed shifts: In: 1342.4 [I.V.:1027.4; Blood:315] Out: 7341 [Urine:1070]   Intake/Output this shift:  No intake/output data recorded.  General:NAD, comfortable Heart:RRR, s1s2 nl Lungs:clear b/l, no crackle Abdomen:soft, Non-tender, non-distended Extremities:No edema Neurology: Alert, awake and following commands.   Basic Metabolic Panel: Recent Labs  Lab 09/26/20 1243 09/27/20 0402 09/28/20 0101  NA 138 139 138  K 4.5 3.9 3.7  CL 109 108 106  CO2 17* 22 21*  GLUCOSE 105* 86 91  BUN 52* 51* 57*  CREATININE 5.22* 5.12* 5.35*  CALCIUM 9.3 8.6* 8.3*  PHOS   --  4.9* 4.9*    Liver Function Tests: Recent Labs  Lab 09/26/20 1243 09/27/20 0402 09/28/20 0101  AST 13* 11*  --   ALT 13 12  --   ALKPHOS 81 65  --   BILITOT 0.6 0.9  --   PROT 7.1 6.0*  --   ALBUMIN 3.3* 2.7* 2.7*   No results for input(s): LIPASE, AMYLASE in the last 168 hours. No results for input(s): AMMONIA in the last 168 hours.  CBC: Recent Labs  Lab 09/24/20 0644 09/24/20 0644 09/26/20 1243 09/26/20 1533 09/27/20 0402 09/27/20 2114 09/28/20 0101  WBC 8.4  --  9.5 9.2 8.0  --  7.9  NEUTROABS  --   --  7.0 6.3  --   --   --   HGB 7.7*   < > 8.0* 7.3* 7.7* 8.0* 7.8*  HCT 24.6*   < > 25.7* 23.0* 23.7* 24.1* 24.2*  MCV 103.4*  --  104.0* 104.1* 99.2  --  100.4*  PLT 310  --  335 301 249  --  274   < > = values in this interval not displayed.    Cardiac Enzymes: No results for input(s): CKTOTAL, CKMB, CKMBINDEX, TROPONINI in the last 168 hours.  BNP: Invalid input(s): POCBNP  CBG: No results for input(s): GLUCAP in the last 168 hours.  Microbiology: Results for orders placed or performed during the hospital encounter of 09/26/20  Respiratory Panel by RT PCR (Flu A&B, Covid) - Nasopharyngeal Swab     Status: None  Collection Time: 09/26/20  2:03 PM   Specimen: Nasopharyngeal Swab  Result Value Ref Range Status   SARS Coronavirus 2 by RT PCR NEGATIVE NEGATIVE Final    Comment: (NOTE) SARS-CoV-2 target nucleic acids are NOT DETECTED.  The SARS-CoV-2 RNA is generally detectable in upper respiratoy specimens during the acute phase of infection. The lowest concentration of SARS-CoV-2 viral copies this assay can detect is 131 copies/mL. A negative result does not preclude SARS-Cov-2 infection and should not be used as the sole basis for treatment or other patient management decisions. A negative result may occur with  improper specimen collection/handling, submission of specimen other than nasopharyngeal swab, presence of viral mutation(s) within  the areas targeted by this assay, and inadequate number of viral copies (<131 copies/mL). A negative result must be combined with clinical observations, patient history, and epidemiological information. The expected result is Negative.  Fact Sheet for Patients:  PinkCheek.be  Fact Sheet for Healthcare Providers:  GravelBags.it  This test is no t yet approved or cleared by the [place] FDA and  has been authorized for detection and/or diagnosis of SARS-CoV-2 by FDA under an Emergency Use Authorization (EUA). This EUA will remain  in effect (meaning this test can be used) for the duration of the COVID-19 declaration under Section 564(b)(1) of the Act, 21 U.S.C. section 360bbb-3(b)(1), unless the authorization is terminated or revoked sooner.     Influenza A by PCR NEGATIVE NEGATIVE Final   Influenza B by PCR NEGATIVE NEGATIVE Final    Comment: (NOTE) The Xpert Xpress SARS-CoV-2/FLU/RSV assay is intended as an aid in  the diagnosis of influenza from Nasopharyngeal swab specimens and  should not be used as a sole basis for treatment. Nasal washings and  aspirates are unacceptable for Xpert Xpress SARS-CoV-2/FLU/RSV  testing.  Fact Sheet for Patients: PinkCheek.be  Fact Sheet for Healthcare Providers: GravelBags.it  This test is not yet approved or cleared by the [place] FDA and  has been authorized for detection and/or diagnosis of SARS-CoV-2 by  FDA under an Emergency Use Authorization (EUA). This EUA will remain  in effect (meaning this test can be used) for the duration of the  Covid-19 declaration under Section 564(b)(1) of the Act, 21  U.S.C. section 360bbb-3(b)(1), unless the authorization is  terminated or revoked. Performed at [facility], [place] [address] [place], [place] [address]     Coagulation Studies: No results for input(s):  LABPROT, INR in the last 72 hours.  Urinalysis: Recent Labs    09/26/20 1319  COLORURINE YELLOW  LABSPEC 1.012  PHURINE 5.0  GLUCOSEU NEGATIVE  HGBUR LARGE*  BILIRUBINUR NEGATIVE  KETONESUR NEGATIVE  PROTEINUR 100*  NITRITE NEGATIVE  LEUKOCYTESUR NEGATIVE      Imaging: US RENAL  Result Date: 09/26/2020 CLINICAL DATA:  69 year old male with acute renal insufficiency. EXAM: RENAL / URINARY TRACT ULTRASOUND COMPLETE COMPARISON:  Renal ultrasound dated 09/17/2020. FINDINGS: Right Kidney: Renal measurements: 11.4 x 5.3 x 5.8 cm = volume: 175 mL. Mild parenchyma atrophy and increased echogenicity. No hydronephrosis or shadowing stone. Several cysts measure up to 2.7 cm. Left Kidney: Renal measurements: 11.4 x 5.3 x 4.6 cm = volume: 139 mL. Mild parenchyma atrophy and increased echogenicity. No hydronephrosis or shadowing stone. Bladder: Appears normal for degree of bladder distention. Other: None. IMPRESSION: Echogenic kidneys in keeping with medical renal disease. No hydronephrosis or shadowing stone. Electronically Signed   By: Anner Crete M.D.   On: 09/26/2020 17:03     Medications:   .  ferric gluconate (FERRLECIT/NULECIT) IV 250 mg (09/27/20 1538)   . sodium chloride   Intravenous Once  . cholecalciferol  1,000 Units Oral Daily  . 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 He does not have any systemic symptoms.Follow-up final biopsy result. Gold QuantiFERON test and hepatitis panel is currently pending.  The plan is to start immunosuppression after those results.  If hepatitis panel comes back negative then I would go ahead and start Solu-Medrol because the urgency of the treatment.  Apparently QuantiFERON test takes longer time to come back, will  await the results before rituximab. Since he does not have any systemic symptoms, not planning for plasma exchange. Consulted pharmacistto start Solu-Medrol 1 g daily for 3 days and rituximab 1 g. Start vitamin D 1000 unit every day, Pepcid 10 mg. Plan for Bactrim 3 times a week as prophylaxis when he is on immunosuppression. Patient agreed with the plan above.  2.Acute kidney injury, nonoliguric: Treatment as above. Monitor BMP. Avoid nephrotoxins. Strict ins and out.  3.Metabolic acidosis: Treated with IV bicarbonate, lab improved.  Discontinue IVF.  Increase oral intake.  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  FOBT positive, may need GI consult will defer to primary team.  Received 1 unit of blood transfusion.  IV  5.Hypertension: Resume atenolol. Monitor blood pressure.  6. History of uric acid nephrolithiasis:   Apparently he is off of allopurinol per his urologist.  Uric acid level 7.5    LOS: 2 Sherril Croon @TODAY @7 :15 AM

## 2020-09-29 LAB — RENAL FUNCTION PANEL
Albumin: 2.9 g/dL — ABNORMAL LOW (ref 3.5–5.0)
Anion gap: 13 (ref 5–15)
BUN: 57 mg/dL — ABNORMAL HIGH (ref 8–23)
CO2: 19 mmol/L — ABNORMAL LOW (ref 22–32)
Calcium: 9.3 mg/dL (ref 8.9–10.3)
Chloride: 108 mmol/L (ref 98–111)
Creatinine, Ser: 5.42 mg/dL — ABNORMAL HIGH (ref 0.61–1.24)
GFR, Estimated: 11 mL/min — ABNORMAL LOW (ref >60)
Glucose, Bld: 141 mg/dL — ABNORMAL HIGH (ref 70–99)
Phosphorus: 4 mg/dL (ref 2.5–4.6)
Potassium: 4.6 mmol/L (ref 3.5–5.1)
Sodium: 140 mmol/L (ref 135–145)

## 2020-09-29 LAB — CBC
HCT: 24.4 % — ABNORMAL LOW (ref 39.0–52.0)
Hemoglobin: 8.1 g/dL — ABNORMAL LOW (ref 13.0–17.0)
MCH: 33.1 pg (ref 26.0–34.0)
MCHC: 33.2 g/dL (ref 30.0–36.0)
MCV: 99.6 fL (ref 80.0–100.0)
Platelets: 305 10*3/uL (ref 150–400)
RBC: 2.45 MIL/uL — ABNORMAL LOW (ref 4.22–5.81)
RDW: 13.7 % (ref 11.5–15.5)
WBC: 8.1 10*3/uL (ref 4.0–10.5)
nRBC: 0 % (ref 0.0–0.2)

## 2020-09-29 MED ORDER — SODIUM CHLORIDE 0.9 % IV SOLN: 400.0000 mg/m2 | Freq: Once | INTRAVENOUS | Status: DC

## 2020-09-29 MED ORDER — SULFAMETHOXAZOLE-TRIMETHOPRIM 800-160 MG PO TABS
1.0000 | ORAL_TABLET | ORAL | Status: DC
  Administered 2020-09-29: 1 via ORAL
  Filled 2020-09-29: qty 1

## 2020-09-29 MED ORDER — ONDANSETRON HCL 4 MG PO TABS: 4.0000 mg | ORAL_TABLET | Freq: Four times a day (QID) | ORAL | Status: DC | PRN

## 2020-09-29 MED ORDER — SODIUM CHLORIDE 0.9 % IV SOLN
200.0000 mg | Freq: Once | INTRAVENOUS | Status: AC
  Administered 2020-09-29: 200 mg via INTRAVENOUS
  Filled 2020-09-29 (×2): qty 2

## 2020-09-29 MED ORDER — SODIUM CHLORIDE 0.9 % IV SOLN
7.5000 mg/kg | Freq: Once | INTRAVENOUS | Status: AC
  Administered 2020-09-29: 600 mg via INTRAVENOUS
  Filled 2020-09-29 (×2): qty 30

## 2020-09-29 NOTE — Progress Notes (Signed)
[facility] ROUNDING NOTE   Subjective:   Brief history This is a 69 year old male with a history of bilateral renal calculi, hematuria proteinuria AAA, initially referred by urologist for acute kidney injury underwent renal biopsy 09/24/2020 consistent with necrotizing crescentic ANCA vasculitis.  He is admitted for treatment for immunosuppression.  He is followed by Dr. Carolin Sicks.  Discussion with his primary nephrologist we have made a decision to treat with Cytoxan and Solu-Medrol.  He will receive Cytoxan every month.  A second dose of Solu-Medrol will be 09/29/2020 500 mg.  Blood pressure 130/68 pulse 86 temperature 97.7 O2 sats 96% room air  Sodium 140 potassium 4.6 chloride 108 CO2 19 BUN 37 creatinine 5.42 glucose 141 calcium 9.3 phosphorus 4 albumin 2.9 hemoglobin 8.1  Objective:  Vital signs in last 24 hours:  Temp:  [97.7 F (36.5 C)-98 F (36.7 C)] 97.7 F (36.5 C) (11/16 0519) Pulse Rate:  [62-68] 66 (11/16 0519) Resp:  [16-18] 18 (11/16 0519) BP: (122-139)/(68-79) 130/68 (11/16 0519) SpO2:  [93 %-98 %] 96 % (11/16 0519)  Weight change:  Filed Weights   09/26/20 1224  Weight: 79.4 kg    Intake/Output: I/O last 3 completed shifts: In: 344 [P.O.:240; IV Piggyback:104] Out: -    Intake/Output this shift:  No intake/output data recorded.  General:NAD, comfortable Heart:RRR, s1s2 nl Lungs:clear b/l, no crackle Abdomen:soft, Non-tender, non-distended Extremities:No edema Neurology: Alert, awake and following commands.   Basic Metabolic Panel: Recent Labs  Lab 09/26/20 1243 09/26/20 1243 09/27/20 0402 09/28/20 0101 09/29/20 0232  NA 138  --  139 138 140  K 4.5  --  3.9 3.7 4.6  CL 109  --  108 106 108  CO2 17*  --  22 21* 19*  GLUCOSE 105*  --  86 91 141*  BUN 52*  --  51* 57* 57*  CREATININE 5.22*  --  5.12* 5.35* 5.42*  CALCIUM 9.3   < > 8.6* 8.3* 9.3  PHOS  --   --  4.9* 4.9* 4.0   < > = values in this interval not displayed.     Liver Function Tests: Recent Labs  Lab 09/26/20 1243 09/27/20 0402 09/28/20 0101 09/29/20 0232  AST 13* 11*  --   --   ALT 13 12  --   --   ALKPHOS 81 65  --   --   BILITOT 0.6 0.9  --   --   PROT 7.1 6.0*  --   --   ALBUMIN 3.3* 2.7* 2.7* 2.9*   No results for input(s): LIPASE, AMYLASE in the last 168 hours. No results for input(s): AMMONIA in the last 168 hours.  CBC: Recent Labs  Lab 09/26/20 1243 09/26/20 1243 09/26/20 1533 09/27/20 0402 09/27/20 2114 09/28/20 0101 09/29/20 0232  WBC 9.5  --  9.2 8.0  --  7.9 8.1  NEUTROABS 7.0  --  6.3  --   --   --   --   HGB 8.0*   < > 7.3* 7.7* 8.0* 7.8* 8.1*  HCT 25.7*   < > 23.0* 23.7* 24.1* 24.2* 24.4*  MCV 104.0*  --  104.1* 99.2  --  100.4* 99.6  PLT 335  --  301 249  --  274 305   < > = values in this interval not displayed.    Cardiac Enzymes: No results for input(s): CKTOTAL, CKMB, CKMBINDEX, TROPONINI in the last 168 hours.  BNP: Invalid input(s): POCBNP  CBG: No results for input(s): GLUCAP  in the last 168 hours.  Microbiology: Results for orders placed or performed during the hospital encounter of 09/26/20  Respiratory Panel by RT PCR (Flu A&B, Covid) - Nasopharyngeal Swab     Status: None   Collection Time: 09/26/20  2:03 PM   Specimen: Nasopharyngeal Swab  Result Value Ref Range Status   SARS Coronavirus 2 by RT PCR NEGATIVE NEGATIVE Final    Comment: (NOTE) SARS-CoV-2 target nucleic acids are NOT DETECTED.  The SARS-CoV-2 RNA is generally detectable in upper respiratoy specimens during the acute phase of infection. The lowest concentration of SARS-CoV-2 viral copies this assay can detect is 131 copies/mL. A negative result does not preclude SARS-Cov-2 infection and should not be used as the sole basis for treatment or other patient management decisions. A negative result may occur with  improper specimen collection/handling, submission of specimen other than nasopharyngeal swab, presence of  viral mutation(s) within the areas targeted by this assay, and inadequate number of viral copies (<131 copies/mL). A negative result must be combined with clinical observations, patient history, and epidemiological information. The expected result is Negative.  Fact Sheet for Patients:  PinkCheek.be  Fact Sheet for Healthcare Providers:  GravelBags.it  This test is no t yet approved or cleared by the [place] FDA and  has been authorized for detection and/or diagnosis of SARS-CoV-2 by FDA under an Emergency Use Authorization (EUA). This EUA will remain  in effect (meaning this test can be used) for the duration of the COVID-19 declaration under Section 564(b)(1) of the Act, 21 U.S.C. section 360bbb-3(b)(1), unless the authorization is terminated or revoked sooner.     Influenza A by PCR NEGATIVE NEGATIVE Final   Influenza B by PCR NEGATIVE NEGATIVE Final    Comment: (NOTE) The Xpert Xpress SARS-CoV-2/FLU/RSV assay is intended as an aid in  the diagnosis of influenza from Nasopharyngeal swab specimens and  should not be used as a sole basis for treatment. Nasal washings and  aspirates are unacceptable for Xpert Xpress SARS-CoV-2/FLU/RSV  testing.  Fact Sheet for Patients: PinkCheek.be  Fact Sheet for Healthcare Providers: GravelBags.it  This test is not yet approved or cleared by the [place] FDA and  has been authorized for detection and/or diagnosis of SARS-CoV-2 by  FDA under an Emergency Use Authorization (EUA). This EUA will remain  in effect (meaning this test can be used) for the duration of the  Covid-19 declaration under Section 564(b)(1) of the Act, 21  U.S.C. section 360bbb-3(b)(1), unless the authorization is  terminated or revoked. Performed at [facility], [place] [address] [place], [place] [address]     Coagulation Studies: No  results for input(s): LABPROT, INR in the last 72 hours.  Urinalysis: Recent Labs    09/26/20 1319  COLORURINE YELLOW  LABSPEC 1.012  PHURINE 5.0  GLUCOSEU NEGATIVE  HGBUR LARGE*  BILIRUBINUR NEGATIVE  KETONESUR NEGATIVE  PROTEINUR 100*  NITRITE NEGATIVE  LEUKOCYTESUR NEGATIVE      Imaging: No results found.   Medications:   . methylPREDNISolone (SOLU-MEDROL) injection 500 mg (09/28/20 1330)   . sodium chloride   Intravenous Once  . cholecalciferol  1,000 Units Oral Daily  . 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 He does not have any systemic symptoms.Follow-up final biopsy result. QuantiFERON hepatitis studies are negative.  We should proceed with  Solu-Medrol and Cytoxan.  He received a dose of Cytoxan 09/29/2020.  Pharmacy will adjust dose based on creatinine clearance and availability.  I also recommend mesna pre and post transfusion to reduce the cumulative risk of bladder cancer.  2.Acute kidney injury, nonoliguric: Treatment as above. Monitor BMP. Avoid nephrotoxins. Strict ins and out.  3.Metabolic acidosis: Treated with IV bicarbonate, lab improved.  Discontinue IVF.  Increase oral intake.  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  FOBT positive, may need GI consult will defer to primary team.  Received 1 unit of blood transfusion.  IV  5.Hypertension: Resume atenolol. Monitor blood pressure.  6. History of uric acid nephrolithiasis:   Apparently he is off of allopurinol per his urologist.  Uric acid level 7.5    LOS: [place] @TODAY @7 :38 AM

## 2020-09-29 NOTE — Progress Notes (Signed)
Discussed the risks and benefits of cytoxan and immunosuppression with the patient. There was time for him to ask questions and he seemed satisfied with the response.  He has received the Covid vaccine in May and was curious about whether to receive the booster.  I told him it would be a good idea to not get the booster due to being immunosuppressed. He was informed that Cytoxan could be associated with hemorrhagic cystitis and some bladder uroepithelial tumors and will require surveillance cystoscopy. He has a urologist and states that he will be getting a cystoscopy soon.

## 2020-09-29 NOTE — Progress Notes (Addendum)
PROGRESS NOTE    Taylor King  ZES:[id number] DOB: [birth year] DOA: 09/26/2020 PCP: Seward Carol, MD    Brief Narrative:  Taylor King is a 69 year old male with past medical history significant for chronic back pain, GERD, recurrent kidney stones who presented to the ED after being directed by his nephrologist, Dr. Carolin Sicks for abnormal creatinine and biopsy results.  Creatinine 1.5 in May 2021 which has subsequently increased.  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  In the ED, temperature 98.2, RR 18, HR 70, BP 138/74.  Sodium 138, bicarb 17, glucose 105, BUN 52, creatinine 5.2, hemoglobin 8.0, hematocrit 25.7.  [facility] was consulted for admission for acute renal failure secondary to glomerulonephritis.   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  Renal ultrasound with echogenic kidneys consistent with medical renal disease, no hydronephrosis or stone.  HIV nonreactive, hepatitis panel and QuantiFERON gold negative.Marland Kitchen --Cr 5.22>5.12>5.35>5.42 (1.56 03/2020) --Solu-Medrol 1 g IV daily x3 days (Day #2/3) --Plan to start cytoxan 11/16 with [place] pre and post infusion to reduce risk of bladder cancer --Vitamin D 1000 units qday --Pepcid --Bactrim 3 times weekly  --Strict I's and O's --monitor renal function closely daily  Non-anion gap metabolic acidosis Improved/resolved following IV bicarbonate drip, now discontinued. --Monitor BMP daily  Essential hypertension BP 130/68 w/ HR 66.  Holding home atenolol. --continue to monitor  BP  Lumbar spinal stenosis Follows with orthopedic surgery, Dr. Melina Schools outpatient.  Currently undergoing outpatient physical therapy.  Uses Tylenol and Goody powder as needed. --Continue Tylenol as needed  Macrocytic anemia Hemoglobin 8.0 and MCV 104.0 on admission. TIBC 276, folate 6.8, B12 716, ferritin 233.  FOBT positive, but denies any dark/tarry stools. S/p 1u pRBC 11/13.  Previously seen by [facility] gastro[facility], Dr. Earle Gell with colonoscopy 2018 with findings 12 mm semipedunculated polyp proximal cecum which was resected and 2 sessile polyps in ascending colon; removed; with recommendations repeat colonoscopy in 3 years.  Uses Goody's powder as needed.  Received 2 doses of IV iron while inpatient. --Hgb 8.0>7.3>7.7>8.0>7.8>8.1 --CBC daily --transfuse to maintain hemoglobin >7.0 --Patient states that he knows to follow-up with his primary gastroenterologist for this  History of recurrent uric acid nephrolithiasis Was previously on allopurinol.  No stones appreciated on renal ultrasound.   DVT prophylaxis: SCDs, ambulation Code Status: Full code Family Communication: No family present at bedside  Disposition Plan:  Status is: Inpatient  Remains inpatient appropriate because:Ongoing diagnostic testing needed not appropriate for outpatient work up, IV treatments appropriate due to intensity of illness or inability to take PO and Inpatient level of care appropriate due to severity of illness   Dispo: The patient is from: Home              Anticipated d/c is to: Home              Anticipated d/c date is: > 3 days              Patient currently is not medically stable to d/c.   Consultants:   Nephrology, Dr. Carolin Sicks, Dr. Justin Mend  Procedures:   Renal  ultrasound  Antimicrobials:   None   Subjective: Patient seen and examined bedside, resting comfortably.  Discussed with nephrology this morning, Dr. Justin Mend.  Started on Solu-Medrol IV yesterday, starting Cytoxan  today.  Patient's fatigue and weakness much improved.  No other questions or concerns at this time. Denies headache, no dizziness, no chest pain, no palpitations, no shortness of breath, no abdominal pain, no weakness, no fatigue, no dizziness, no fever/chills/night sweats, no nausea/vomiting/diarrhea.  No acute events overnight per nursing staff.  Objective: Vitals:   09/28/20 1152 09/28/20 1717 09/29/20 0007 09/29/20 0519  BP: 139/69 139/79 122/74 130/68  Pulse: 68 62 67 66  Resp: 16 16 16 18   Temp: 97.8 F (36.6 C) 97.7 F (36.5 C) 98 F (36.7 C) 97.7 F (36.5 C)  TempSrc: Oral Oral Oral Oral  SpO2: 98% 93% 95% 96%  Weight:      Height:        Intake/Output Summary (Last 24 hours) at 09/29/2020 1054 Last data filed at 09/29/2020 0300 Gross per 24 hour  Intake 344 ml  Output --  Net 344 ml   Filed Weights   09/26/20 1224  Weight: 79.4 kg    Examination:  General exam: Appears calm and comfortable  Respiratory system: Clear to auscultation. Respiratory effort normal. Cardiovascular system: S1 & S2 heard, RRR. No JVD, murmurs, rubs, gallops or clicks. No pedal edema. Gastrointestinal system: Abdomen is nondistended, soft and nontender. No organomegaly or masses felt. Normal bowel sounds heard. Central nervous system: Alert and oriented. No focal neurological deficits. Extremities: Symmetric 5 x 5 power. Skin: No rashes, lesions or ulcers Psychiatry: Judgement and insight appear normal. Mood & affect appropriate.     Data Reviewed: I have personally reviewed following labs and imaging studies  CBC: Recent Labs  Lab 09/26/20 1243 09/26/20 1243 09/26/20 1533 09/27/20 0402 09/27/20 2114 09/28/20 0101 09/29/20 0232  WBC 9.5  --  9.2 8.0  --  7.9 8.1  NEUTROABS 7.0  --  6.3  --   --   --   --   HGB 8.0*   < > 7.3* 7.7* 8.0* 7.8* 8.1*  HCT 25.7*   < > 23.0* 23.7* 24.1* 24.2* 24.4*  MCV 104.0*  --  104.1* 99.2  --  100.4* 99.6  PLT 335  --  301 249  --  274 305    < > = values in this interval not displayed.   Basic Metabolic Panel: Recent Labs  Lab 09/26/20 1243 09/27/20 0402 09/28/20 0101 09/29/20 0232  NA 138 139 138 140  K 4.5 3.9 3.7 4.6  CL 109 108 106 108  CO2 17* 22 21* 19*  GLUCOSE 105* 86 91 141*  BUN 52* 51* 57* 57*  CREATININE 5.22* 5.12* 5.35* 5.42*  CALCIUM 9.3 8.6* 8.3* 9.3  PHOS  --  4.9* 4.9* 4.0   GFR: Estimated Creatinine Clearance: 13 mL/min (A) (by C-G formula based on SCr of 5.42 mg/dL (H)). Liver Function Tests: Recent Labs  Lab 09/26/20 1243 09/27/20 0402 09/28/20 0101 09/29/20 0232  AST 13* 11*  --   --   ALT 13 12  --   --   ALKPHOS 81 65  --   --   BILITOT 0.6 0.9  --   --   PROT 7.1 6.0*  --   --   ALBUMIN 3.3* 2.7* 2.7* 2.9*   No results for input(s): LIPASE, AMYLASE in the last 168 hours. No results for input(s): AMMONIA in the  last 168 hours. Coagulation Profile: Recent Labs  Lab 09/24/20 0644  INR 1.0   Cardiac Enzymes: No results for input(s): CKTOTAL, CKMB, CKMBINDEX, TROPONINI in the last 168 hours. BNP (last 3 results) No results for input(s): PROBNP in the last 8760 hours. HbA1C: No results for input(s): HGBA1C in the last 72 hours. CBG: No results for input(s): GLUCAP in the last 168 hours. Lipid Profile: No results for input(s): CHOL, HDL, LDLCALC, TRIG, CHOLHDL, LDLDIRECT in the last 72 hours. Thyroid Function Tests: No results for input(s): TSH, T4TOTAL, FREET4, T3FREE, THYROIDAB in the last 72 hours. Anemia Panel: Recent Labs    09/27/20 0402  VITAMINB12 716  FOLATE 6.8  FERRITIN 231  TIBC 276  IRON 58  RETICCTPCT 2.0   Sepsis Labs: No results for input(s): PROCALCITON, LATICACIDVEN in the last 168 hours.  Recent Results (from the past 240 hour(s))  Respiratory Panel by RT PCR (Flu A&B, Covid) - Nasopharyngeal Swab     Status: None   Collection Time: 09/26/20  2:03 PM   Specimen: Nasopharyngeal Swab  Result Value Ref Range Status   SARS Coronavirus 2 by RT PCR  NEGATIVE NEGATIVE Final    Comment: (NOTE) SARS-CoV-2 target nucleic acids are NOT DETECTED.  The SARS-CoV-2 RNA is generally detectable in upper respiratoy specimens during the acute phase of infection. The lowest concentration of SARS-CoV-2 viral copies this assay can detect is 131 copies/mL. A negative result does not preclude SARS-Cov-2 infection and should not be used as the sole basis for treatment or other patient management decisions. A negative result may occur with  improper specimen collection/handling, submission of specimen other than nasopharyngeal swab, presence of viral mutation(s) within the areas targeted by this assay, and inadequate number of viral copies (<131 copies/mL). A negative result must be combined with clinical observations, patient history, and epidemiological information. The expected result is Negative.  Fact Sheet for Patients:  PinkCheek.be  Fact Sheet for Healthcare Providers:  GravelBags.it  This test is no t yet approved or cleared by the [place] FDA and  has been authorized for detection and/or diagnosis of SARS-CoV-2 by FDA under an Emergency Use Authorization (EUA). This EUA will remain  in effect (meaning this test can be used) for the duration of the COVID-19 declaration under Section 564(b)(1) of the Act, 21 U.S.C. section 360bbb-3(b)(1), unless the authorization is terminated or revoked sooner.     Influenza A by PCR NEGATIVE NEGATIVE Final   Influenza B by PCR NEGATIVE NEGATIVE Final    Comment: (NOTE) The Xpert Xpress SARS-CoV-2/FLU/RSV assay is intended as an aid in  the diagnosis of influenza from Nasopharyngeal swab specimens and  should not be used as a sole basis for treatment. Nasal washings and  aspirates are unacceptable for Xpert Xpress SARS-CoV-2/FLU/RSV  testing.  Fact Sheet for Patients: PinkCheek.be  Fact Sheet for  Healthcare Providers: GravelBags.it  This test is not yet approved or cleared by the [place] FDA and  has been authorized for detection and/or diagnosis of SARS-CoV-2 by  FDA under an Emergency Use Authorization (EUA). This EUA will remain  in effect (meaning this test can be used) for the duration of the  Covid-19 declaration under Section 564(b)(1) of the Act, 21  U.S.C. section 360bbb-3(b)(1), unless the authorization is  terminated or revoked. Performed at [facility], [place] [address] [place], [place] [address]          Radiology Studies: No results found.      Scheduled  Meds: . sodium chloride   Intravenous Once  . cholecalciferol  1,000 Units Oral Daily  . cyclophosphamide  7.5 mg/kg Intravenous Once  . famotidine  10 mg Oral Daily  . sulfamethoxazole-trimethoprim  1 tablet Oral Once per day on Mon Wed Fri   Continuous Infusions: . mesna    . methylPREDNISolone (SOLU-MEDROL) injection 500 mg (09/28/20 1330)     LOS: 3 days    Time spent: 35 minutes spent on chart review, discussion with nursing staff, consultants, updating family and interview/physical exam; more than 50% of that time was spent in counseling and/or coordination of care.    Monicka Cyran J [place] DO [facility] Available via Epic secure chat 7am-7pm After these hours, please refer to coverage provider listed on [email or web address] 09/29/2020, 10:54 AM

## 2020-09-30 ENCOUNTER — Other Ambulatory Visit (HOSPITAL_COMMUNITY): Payer: Self-pay | Admitting: Internal Medicine

## 2020-09-30 LAB — RENAL FUNCTION PANEL
Albumin: 2.9 g/dL — ABNORMAL LOW (ref 3.5–5.0)
Anion gap: 11 (ref 5–15)
BUN: 71 mg/dL — ABNORMAL HIGH (ref 8–23)
CO2: 20 mmol/L — ABNORMAL LOW (ref 22–32)
Calcium: 9.1 mg/dL (ref 8.9–10.3)
Chloride: 109 mmol/L (ref 98–111)
Creatinine, Ser: 5.64 mg/dL — ABNORMAL HIGH (ref 0.61–1.24)
GFR, Estimated: 10 mL/min — ABNORMAL LOW (ref >60)
Glucose, Bld: 148 mg/dL — ABNORMAL HIGH (ref 70–99)
Phosphorus: 4.9 mg/dL — ABNORMAL HIGH (ref 2.5–4.6)
Potassium: 4.8 mmol/L (ref 3.5–5.1)
Sodium: 140 mmol/L (ref 135–145)

## 2020-09-30 LAB — CBC
HCT: 22.5 % — ABNORMAL LOW (ref 39.0–52.0)
Hemoglobin: 7.4 g/dL — ABNORMAL LOW (ref 13.0–17.0)
MCH: 33.5 pg (ref 26.0–34.0)
MCHC: 32.9 g/dL (ref 30.0–36.0)
MCV: 101.8 fL — ABNORMAL HIGH (ref 80.0–100.0)
Platelets: 277 10*3/uL (ref 150–400)
RBC: 2.21 MIL/uL — ABNORMAL LOW (ref 4.22–5.81)
RDW: 13.9 % (ref 11.5–15.5)
WBC: 13 10*3/uL — ABNORMAL HIGH (ref 4.0–10.5)
nRBC: 0 % (ref 0.0–0.2)

## 2020-09-30 LAB — PREPARE RBC (CROSSMATCH)

## 2020-09-30 MED ORDER — FAMOTIDINE 10 MG PO TABS: 10.0000 mg | ORAL_TABLET | Freq: Every day | ORAL | 0 refills | Status: DC

## 2020-09-30 MED ORDER — SODIUM CHLORIDE 0.9% IV SOLUTION: Freq: Once | INTRAVENOUS | Status: AC

## 2020-09-30 MED ORDER — PREDNISONE 20 MG PO TABS: 60.0000 mg | ORAL_TABLET | Freq: Every day | ORAL | 0 refills | Status: DC

## 2020-09-30 MED ORDER — SULFAMETHOXAZOLE-TRIMETHOPRIM 800-160 MG PO TABS: 1.0000 | ORAL_TABLET | ORAL | 0 refills | Status: DC

## 2020-09-30 MED FILL — predniSONE 20 MG TABS: 20 | 30 days supply | Qty: 90 | Fill #0

## 2020-09-30 MED FILL — SULFAMETHOXAZOLE-TMP DS TAB: 800-160 | 12 days supply | Qty: 36 | Fill #0

## 2020-09-30 NOTE — Discharge Summary (Signed)
Physician Discharge Summary  Taylor King TDS:[id number] DOB: [birth year] DOA: 09/26/2020  PCP: Seward Carol, MD  Admit date: 09/26/2020 Discharge date: 09/30/2020  Admitted From: Home Disposition: Home    Recommendations for Outpatient Follow-up:  1. Follow up with PCP in 1-2 weeks 2. Follow-up with nephrology, Dr. Carolin Sicks as scheduled 10/06/2020 3. Please obtain BMP/CBC in the next visit 4. 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 daily for MPO ANCA glomerulonephritis  Home Health: No Equipment/Devices: None  Discharge Condition: Stable CODE STATUS: Full code Diet recommendation: Regular diet  History of present illness:  Taylor King is a 69 year old male with past medical history significant for chronic back pain, GERD, recurrent kidney stones who presented to the ED after being directed by his nephrologist, Dr. Carolin Sicks for abnormal creatinine and biopsy results.  Creatinine 1.5 in May 2021 which has subsequently increased.  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  In the ED, temperature 98.2, RR 18, HR 70, BP 138/74.  Sodium 138, bicarb 17, glucose 105, BUN 52, creatinine 5.2, hemoglobin 8.0, hematocrit 25.7.  TRH was consulted for admission for acute renal failure secondary to glomerulonephritis.  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  Renal ultrasound with echogenic kidneys consistent with medical renal disease, no hydronephrosis or stone.  HIV nonreactive, hepatitis panel and QuantiFERON gold negative.  Patient was started on IV Solu-Medrol received 500 mg daily x3 days.  Started on Cytoxan,  received dose on 09/29/2020 with [place] pre and post infusion to reduce risk of bladder cancer.  Continue prednisone 60 mg p.o. daily and Bactrim 3 times weekly.  Has follow-up scheduled with nephrology, Dr. Carolin Sicks on 10/06/2020.  Recommend repeat CBC and BMP at visit.  Creatinine 5.64 at time of discharge.  Non-anion gap metabolic acidosis Improved/resolved following IV bicarbonate drip.  BMP next visit  Essential hypertension BP 111/61 w/ HR 62.    Discontinued home atenolol.  Outpatient follow-up with PCP  Lumbar spinal stenosis Follows with orthopedic surgery, Dr. Melina Schools outpatient.  Currently undergoing outpatient physical therapy.  Uses Tylenol and Goody powder as needed.  Discussed need to avoid Goody powder and continue Tylenol as needed.  Macrocytic anemia Hemoglobin 8.0 and MCV 104.0 on admission. TIBC 276, folate 6.8, B12 716, ferritin 233.  FOBT positive, but denies any dark/tarry stools. S/p 1u pRBC 11/13.  Previously seen by [facility] gastroenterology, Dr. Earle Gell with colonoscopy 2018 with findings 12 mm semipedunculated polyp proximal cecum which was resected and 2 sessile polyps in ascending colon; removed; with recommendations repeat colonoscopy in 3 years.  Uses Goody's powder as needed.  Received 2 doses of IV iron while inpatient.  Transfuse 1 unit PRBC on 09/30/2020.  Needs close follow-up with his primary gastrologist following discharge for repeat colonoscopy.  CBC at next visit.  History of recurrent uric acid nephrolithiasis Was previously on allopurinol.  No stones appreciated on renal ultrasound.  Discharge Diagnoses:  Principal Problem:   AKI (acute kidney injury) [facility]) Active Problems:   Lumbar spinal stenosis    Discharge Instructions  Discharge Instructions    Call MD for:  difficulty breathing, headache or visual disturbances   Complete by: As directed    Call MD  for:  extreme fatigue   Complete by: As directed    Call MD for:   persistant dizziness or light-headedness   Complete by: As directed    Call MD for:  persistant nausea and vomiting   Complete by: As directed    Call MD for:  severe uncontrolled pain   Complete by: As directed    Call MD for:  temperature >100.4   Complete by: As directed    Increase activity slowly   Complete by: As directed    No wound care   Complete by: As directed      Allergies as of 09/30/2020      Reactions   Other Other (See Comments)   Anesthesia medication (pt unsure of which med)- reaction upset stomach. (after colonoscopy)      Medication List    STOP taking these medications   atenolol 25 MG tablet Commonly known as: TENORMIN     TAKE these medications   acetaminophen 500 MG tablet Commonly known as: TYLENOL Take 1,000 mg by mouth every 6 (six) hours as needed for moderate pain or headache.   aspirin EC 325 MG tablet Take 325 mg by mouth daily as needed (headache).   famotidine 10 MG tablet Commonly known as: PEPCID Take 1 tablet (10 mg total) by mouth daily. Start taking on: October 01, 2020   GOODYS BODY PAIN PO Take 1 packet by mouth daily as needed (pain).   predniSONE 20 MG tablet Commonly known as: DELTASONE Take 3 tablets (60 mg total) by mouth daily.   sulfamethoxazole-trimethoprim 800-160 MG tablet Commonly known as: BACTRIM DS Take 1 tablet by mouth 3 (three) times a week. Start taking on: October 02, 2020   vitamin B-12 1000 MCG tablet Commonly known as: CYANOCOBALAMIN Take 1,000 mcg by mouth daily.       Follow-up Information    Seward Carol, MD. Schedule an appointment as soon as possible for a visit in 1 week(s).   Specialty: Internal Medicine Contact information: [address] [place] [address] [phone]        Rosita Fire, MD. Go on 10/06/2020.   Specialties: Nephrology, Internal Medicine Contact information: [address] [place] [address] [phone]               Allergies  Allergen Reactions  . Other Other (See Comments)    Anesthesia medication (pt unsure of which med)- reaction upset stomach. (after colonoscopy)    Consultations:  Nephrology, Dr. Carolin Sicks, Dr. Justin Mend   Procedures/Studies: US RENAL  Result Date: 09/26/2020 CLINICAL DATA:  69 year old male with acute renal insufficiency. EXAM: RENAL / URINARY TRACT ULTRASOUND COMPLETE COMPARISON:  Renal ultrasound dated 09/17/2020. FINDINGS: Right Kidney: Renal measurements: 11.4 x 5.3 x 5.8 cm = volume: 175 mL. Mild parenchyma atrophy and increased echogenicity. No hydronephrosis or shadowing stone. Several cysts measure up to 2.7 cm. Left Kidney: Renal measurements: 11.4 x 5.3 x 4.6 cm = volume: 139 mL. Mild parenchyma atrophy and increased echogenicity. No hydronephrosis or shadowing stone. Bladder: Appears normal for degree of bladder distention. Other: None. IMPRESSION: Echogenic kidneys in keeping with medical renal disease. No hydronephrosis or shadowing stone. Electronically Signed   By: Anner Crete M.D.   On: 09/26/2020 17:03   US RENAL  Result Date: 09/17/2020 CLINICAL DATA:  AK I EXAM: RENAL / URINARY TRACT ULTRASOUND COMPLETE COMPARISON:  Renal ultrasound 06/23/2020 FINDINGS: Right Kidney: Renal measurements: 10.1 x 6.0 x 6.1 cm = volume: 195 mL.  Echogenicity within normal limits. No hydronephrosis. There are 2 renal cysts measuring 1.8 cm and 2.5 cm. Left Kidney: Renal measurements: 12.5 x 6.2 x 4.3 cm = volume: 174 mL. Echogenicity within normal limits. No hydronephrosis. There are a couple of tiny renal cysts measuring up to 0.8 cm. Bladder: Appears normal for degree of bladder distention. Other: None. IMPRESSION: Bilateral renal cysts.  Otherwise unremarkable exam. Electronically Signed   By: Audie Pinto M.D.   On: 09/17/2020 14:39   US BIOPSY (KIDNEY)  Result Date: 09/24/2020 INDICATION: 69 year old male with history of acute kidney injury. EXAM: ULTRASOUND GUIDED RENAL  BIOPSY COMPARISON:  09/17/2020 MEDICATIONS: None. ANESTHESIA/SEDATION: Fentanyl 50 mcg IV; Versed 1 mg IV Total Moderate Sedation time: 14 minutes; The patient was continuously monitored during the procedure by the interventional radiology nurse under my direct supervision. COMPLICATIONS: None immediate. PROCEDURE: Informed written consent was obtained from the patient after a discussion of the risks, benefits and alternatives to treatment. The patient understands and consents the procedure. A timeout was performed prior to the initiation of the procedure. Ultrasound scanning was performed of the bilateral flanks. The inferior pole of the left kidney was selected for biopsy due to location and sonographic window. The procedure was planned. The operative site was prepped and draped in the usual sterile fashion. The overlying soft tissues were anesthetized with 1% lidocaine with epinephrine. A 16 gauge core needle biopsy device was advanced into the inferior cortex of the left kidney and 3 core biopsies were obtained under direct ultrasound guidance. Images were saved for documentation purposes. The biopsy device was removed and hemostasis was obtained with injection of Gel-Foam slurry around the renal capsule and tract as well as manual compression. Post procedural scanning was negative for significant post procedural hemorrhage or additional complication. A dressing was placed. The patient tolerated the procedure well without immediate post procedural complication. IMPRESSION: Technically successful ultrasound guided left inferior pole renal biopsy. Ruthann Cancer, MD [facility] Electronically Signed   By: Ruthann Cancer MD   On: 09/24/2020 10:31     Subjective: Patient seen and examined bedside, resting comfortably.  Nephrology, Dr. Justin Mend present.  Okay to discharge home today following final dose of IV steroids and blood transfusion.  Has scheduled  follow-up with Dr. Carolin Sicks next week.  Discussed with patient regarding discontinuation of atenolol.  No other complaints or concerns at this time.  Denies headache, no visual changes, no chest pain, palpitations, no shortness of breath, no abdominal pain, no weakness, no fatigue, no paresthesias.  No acute events overnight per nursing staff.  Discharge Exam: Vitals:   09/30/20 1231 09/30/20 1245  BP: 137/70 131/70  Pulse: 67 62  Resp: 18 18  Temp: (!) 97.5 F (36.4 C) 97.8 F (36.6 C)  SpO2: 98% 99%   Vitals:   09/29/20 2346 09/30/20 0521 09/30/20 1231 09/30/20 1245  BP: 120/66 111/61 137/70 131/70  Pulse: 73 62 67 62  Resp: 18 18 18 18   Temp: 98.4 F (36.9 C) 97.7 F (36.5 C) (!) 97.5 F (36.4 C) 97.8 F (36.6 C)  TempSrc: Oral Oral Oral Oral  SpO2: 96% 96% 98% 99%  Weight:      Height:        General: Pt is alert, awake, not in acute distress Cardiovascular: RRR, S1/S2 +, no rubs, no gallops Respiratory: CTA bilaterally, no wheezing, no rhonchi Abdominal: Soft, NT, ND, bowel sounds + Extremities: no edema, no cyanosis    The  results of significant diagnostics from this hospitalization (including imaging, microbiology, ancillary and laboratory) are listed below for reference.     Microbiology: Recent Results (from the past 240 hour(s))  Respiratory Panel by RT PCR (Flu A&B, Covid) - Nasopharyngeal Swab     Status: None   Collection Time: 09/26/20  2:03 PM   Specimen: Nasopharyngeal Swab  Result Value Ref Range Status   SARS Coronavirus 2 by RT PCR NEGATIVE NEGATIVE Final    Comment: (NOTE) SARS-CoV-2 target nucleic acids are NOT DETECTED.  The SARS-CoV-2 RNA is generally detectable in upper respiratoy specimens during the acute phase of infection. The lowest concentration of SARS-CoV-2 viral copies this assay can detect is 131 copies/mL. A negative result does not preclude SARS-Cov-2 infection and should not be used as the sole basis for treatment or other  patient management decisions. A negative result may occur with  improper specimen collection/handling, submission of specimen other than nasopharyngeal swab, presence of viral mutation(s) within the areas targeted by this assay, and inadequate number of viral copies (<131 copies/mL). A negative result must be combined with clinical observations, patient history, and epidemiological information. The expected result is Negative.  Fact Sheet for Patients:  PinkCheek.be  Fact Sheet for Healthcare Providers:  GravelBags.it  This test is no t yet approved or cleared by the [place] FDA and  has been authorized for detection and/or diagnosis of SARS-CoV-2 by FDA under an Emergency Use Authorization (EUA). This EUA will remain  in effect (meaning this test can be used) for the duration of the COVID-19 declaration under Section 564(b)(1) of the Act, 21 U.S.C. section 360bbb-3(b)(1), unless the authorization is terminated or revoked sooner.     Influenza A by PCR NEGATIVE NEGATIVE Final   Influenza B by PCR NEGATIVE NEGATIVE Final    Comment: (NOTE) The Xpert Xpress SARS-CoV-2/FLU/RSV assay is intended as an aid in  the diagnosis of influenza from Nasopharyngeal swab specimens and  should not be used as a sole basis for treatment. Nasal washings and  aspirates are unacceptable for Xpert Xpress SARS-CoV-2/FLU/RSV  testing.  Fact Sheet for Patients: PinkCheek.be  Fact Sheet for Healthcare Providers: GravelBags.it  This test is not yet approved or cleared by the [place] FDA and  has been authorized for detection and/or diagnosis of SARS-CoV-2 by  FDA under an Emergency Use Authorization (EUA). This EUA will remain  in effect (meaning this test can be used) for the duration of the  Covid-19 declaration under Section 564(b)(1) of the Act, 21  U.S.C. section  360bbb-3(b)(1), unless the authorization is  terminated or revoked. Performed at [facility], [place] [address], [place], [place] [address]      Labs: BNP (last 3 results) No results for input(s): BNP in the last 8760 hours. Basic Metabolic Panel: Recent Labs  Lab 09/26/20 1243 09/27/20 0402 09/28/20 0101 09/29/20 0232 09/30/20 0258  NA 138 139 138 140 140  K 4.5 3.9 3.7 4.6 4.8  CL 109 108 106 108 109  CO2 17* 22 21* 19* 20*  GLUCOSE 105* 86 91 141* 148*  BUN 52* 51* 57* 57* 71*  CREATININE 5.22* 5.12* 5.35* 5.42* 5.64*  CALCIUM 9.3 8.6* 8.3* 9.3 9.1  PHOS  --  4.9* 4.9* 4.0 4.9*   Liver Function Tests: Recent Labs  Lab 09/26/20 1243 09/27/20 0402 09/28/20 0101 09/29/20 0232 09/30/20 0258  AST 13* 11*  --   --   --   ALT 13 12  --   --   --  ALKPHOS 81 65  --   --   --   BILITOT 0.6 0.9  --   --   --   PROT 7.1 6.0*  --   --   --   ALBUMIN 3.3* 2.7* 2.7* 2.9* 2.9*   No results for input(s): LIPASE, AMYLASE in the last 168 hours. No results for input(s): AMMONIA in the last 168 hours. CBC: Recent Labs  Lab 09/26/20 1243 09/26/20 1243 09/26/20 1533 09/26/20 1533 09/27/20 0402 09/27/20 2114 09/28/20 0101 09/29/20 0232 09/30/20 0258  WBC 9.5   < > 9.2  --  8.0  --  7.9 8.1 13.0*  NEUTROABS 7.0  --  6.3  --   --   --   --   --   --   HGB 8.0*   < > 7.3*   < > 7.7* 8.0* 7.8* 8.1* 7.4*  HCT 25.7*   < > 23.0*   < > 23.7* 24.1* 24.2* 24.4* 22.5*  MCV 104.0*   < > 104.1*  --  99.2  --  100.4* 99.6 101.8*  PLT 335   < > 301  --  249  --  274 305 277   < > = values in this interval not displayed.   Cardiac Enzymes: No results for input(s): CKTOTAL, CKMB, CKMBINDEX, TROPONINI in the last 168 hours. BNP: Invalid input(s): POCBNP CBG: No results for input(s): GLUCAP in the last 168 hours. D-Dimer No results for input(s): DDIMER in the last 72 hours. Hgb A1c No results for input(s): HGBA1C in the last 72 hours. Lipid Profile No results for input(s):  CHOL, HDL, LDLCALC, TRIG, CHOLHDL, LDLDIRECT in the last 72 hours. Thyroid function studies No results for input(s): TSH, T4TOTAL, T3FREE, THYROIDAB in the last 72 hours.  Invalid input(s): FREET3 Anemia work up No results for input(s): VITAMINB12, FOLATE, FERRITIN, TIBC, IRON, RETICCTPCT in the last 72 hours. Urinalysis    Component Value Date/Time   COLORURINE YELLOW 09/26/2020 1319   APPEARANCEUR CLEAR 09/26/2020 1319   LABSPEC 1.012 09/26/2020 1319   PHURINE 5.0 09/26/2020 1319   GLUCOSEU NEGATIVE 09/26/2020 1319   HGBUR LARGE (A) 09/26/2020 1319   BILIRUBINUR NEGATIVE 09/26/2020 1319   KETONESUR NEGATIVE 09/26/2020 1319   PROTEINUR 100 (A) 09/26/2020 1319   NITRITE NEGATIVE 09/26/2020 1319   LEUKOCYTESUR NEGATIVE 09/26/2020 1319   Sepsis Labs Invalid input(s): PROCALCITONIN,  WBC,  LACTICIDVEN Microbiology Recent Results (from the past 240 hour(s))  Respiratory Panel by RT PCR (Flu A&B, Covid) - Nasopharyngeal Swab     Status: None   Collection Time: 09/26/20  2:03 PM   Specimen: Nasopharyngeal Swab  Result Value Ref Range Status   SARS Coronavirus 2 by RT PCR NEGATIVE NEGATIVE Final    Comment: (NOTE) SARS-CoV-2 target nucleic acids are NOT DETECTED.  The SARS-CoV-2 RNA is generally detectable in upper respiratoy specimens during the acute phase of infection. The lowest concentration of SARS-CoV-2 viral copies this assay can detect is 131 copies/mL. A negative result does not preclude SARS-Cov-2 infection and should not be used as the sole basis for treatment or other patient management decisions. A negative result may occur with  improper specimen collection/handling, submission of specimen other than nasopharyngeal swab, presence of viral mutation(s) within the areas targeted by this assay, and inadequate number of viral copies (<131 copies/mL). A negative result must be combined with clinical observations, patient history, and epidemiological information.  The expected result is Negative.  Fact Sheet for Patients:  PinkCheek.be  Fact  Sheet for Healthcare Providers:  GravelBags.it  This test is no t yet approved or cleared by the [place] FDA and  has been authorized for detection and/or diagnosis of SARS-CoV-2 by FDA under an Emergency Use Authorization (EUA). This EUA will remain  in effect (meaning this test can be used) for the duration of the COVID-19 declaration under Section 564(b)(1) of the Act, 21 U.S.C. section 360bbb-3(b)(1), unless the authorization is terminated or revoked sooner.     Influenza A by PCR NEGATIVE NEGATIVE Final   Influenza B by PCR NEGATIVE NEGATIVE Final    Comment: (NOTE) The Xpert Xpress SARS-CoV-2/FLU/RSV assay is intended as an aid in  the diagnosis of influenza from Nasopharyngeal swab specimens and  should not be used as a sole basis for treatment. Nasal washings and  aspirates are unacceptable for Xpert Xpress SARS-CoV-2/FLU/RSV  testing.  Fact Sheet for Patients: PinkCheek.be  Fact Sheet for Healthcare Providers: GravelBags.it  This test is not yet approved or cleared by the [place] FDA and  has been authorized for detection and/or diagnosis of SARS-CoV-2 by  FDA under an Emergency Use Authorization (EUA). This EUA will remain  in effect (meaning this test can be used) for the duration of the  Covid-19 declaration under Section 564(b)(1) of the Act, 21  U.S.C. section 360bbb-3(b)(1), unless the authorization is  terminated or revoked. Performed at [facility], [place] [address] [place], [place] [address]      Time coordinating discharge: Over 30 minutes  SIGNED:   Gaetan Spieker J [place] DO  [facility] 09/30/2020, 2:58 PM

## 2020-09-30 NOTE — Progress Notes (Signed)
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  He is admitted for treatment for immunosuppression.  He is followed by Dr. Carolin Sicks.  Discussion with his primary nephrologist we have made a decision to treat with Cytoxan and Solu-Medrol.  He will receive Cytoxan every month.  A third dose of Solu-Medrol will be 09/30/2020.  He can be discharged home with close follow-up at [facility].  I would recommend 60 mg once daily prednisone.  He will see Dr. Carolin Sicks next week.  Blood pressure 111/61 pulse 62 temperature 97.7 O2 sats 96% room air  Sodium 140 potassium 4.8 chloride 109 CO2 20 BUN 71 creatinine 5.64 glucose 148 calcium 9.1 hemoglobin 7.4  Objective:  Vital signs in last 24 hours:  Temp:  [97.6 F (36.4 C)-98.4 F (36.9 C)] 97.7 F (36.5 C) (11/17 0521) Pulse Rate:  [62-79] 62 (11/17 0521) Resp:  [17-18] 18 (11/17 0521) BP: (111-140)/(61-79) 111/61 (11/17 0521) SpO2:  [96 %-97 %] 96 % (11/17 0521)  Weight change:  Filed Weights   09/26/20 1224  Weight: 79.4 kg    Intake/Output: I/O last 3 completed shifts: In: 618 [P.O.:240; IV Piggyback:378] Out: -    Intake/Output this shift:  No intake/output data recorded.  General:NAD, comfortable Heart:RRR, s1s2 nl Lungs:clear b/l, no crackle Abdomen:soft, Non-tender, non-distended Extremities:No edema Neurology: Alert, awake and following commands.   Basic Metabolic Panel: Recent Labs  Lab 09/26/20 1243 09/26/20 1243 09/27/20 0402 09/27/20 0402 09/28/20 0101 09/29/20 0232 09/30/20 0258  NA 138  --  139  --  138 140 140  K 4.5  --  3.9  --  3.7 4.6 4.8  CL 109  --  108  --  106 108 109  CO2 17*  --  22  --  21* 19* 20*  GLUCOSE 105*  --   86  --  91 141* 148*  BUN 52*  --  51*  --  57* 57* 71*  CREATININE 5.22*  --  5.12*  --  5.35* 5.42* 5.64*  CALCIUM 9.3   < > 8.6*   < > 8.3* 9.3 9.1  PHOS  --   --  4.9*  --  4.9* 4.0 4.9*   < > = values in this interval not displayed.    Liver Function Tests: Recent Labs  Lab 09/26/20 1243 09/27/20 0402 09/28/20 0101 09/29/20 0232 09/30/20 0258  AST 13* 11*  --   --   --   ALT 13 12  --   --   --   ALKPHOS 81 65  --   --   --   BILITOT 0.6 0.9  --   --   --   PROT 7.1 6.0*  --   --   --   ALBUMIN 3.3* 2.7* 2.7* 2.9* 2.9*   No results for input(s): LIPASE, AMYLASE in the last 168 hours. No results for input(s): AMMONIA in the last 168 hours.  CBC: Recent Labs  Lab 09/26/20 1243 09/26/20 1243 09/26/20 1533 09/26/20 1533 09/27/20 0402 09/27/20 2114 09/28/20 0101 09/29/20 0232 09/30/20 0258  WBC 9.5   < > 9.2  --  8.0  --  7.9 8.1 13.0*  NEUTROABS 7.0  --  6.3  --   --   --   --   --   --  HGB 8.0*   < > 7.3*   < > 7.7* 8.0* 7.8* 8.1* 7.4*  HCT 25.7*   < > 23.0*   < > 23.7* 24.1* 24.2* 24.4* 22.5*  MCV 104.0*   < > 104.1*  --  99.2  --  100.4* 99.6 101.8*  PLT 335   < > 301  --  249  --  274 305 277   < > = values in this interval not displayed.    Cardiac Enzymes: No results for input(s): CKTOTAL, CKMB, CKMBINDEX, TROPONINI in the last 168 hours.  BNP: Invalid input(s): POCBNP  CBG: No results for input(s): GLUCAP in the last 168 hours.  Microbiology: Results for orders placed or performed during the hospital encounter of 09/26/20  Respiratory Panel by RT PCR (Flu A&B, Covid) - Nasopharyngeal Swab     Status: None   Collection Time: 09/26/20  2:03 PM   Specimen: Nasopharyngeal Swab  Result Value Ref Range Status   SARS Coronavirus 2 by RT PCR NEGATIVE NEGATIVE Final    Comment: (NOTE) SARS-CoV-2 target nucleic acids are NOT DETECTED.  The SARS-CoV-2 RNA is generally detectable in upper respiratoy specimens during the acute phase of infection. The  lowest concentration of SARS-CoV-2 viral copies this assay can detect is 131 copies/mL. A negative result does not preclude SARS-Cov-2 infection and should not be used as the sole basis for treatment or other patient management decisions. A negative result may occur with  improper specimen collection/handling, submission of specimen other than nasopharyngeal swab, presence of viral mutation(s) within the areas targeted by this assay, and inadequate number of viral copies (<131 copies/mL). A negative result must be combined with clinical observations, patient history, and epidemiological information. The expected result is Negative.  Fact Sheet for Patients:  PinkCheek.be  Fact Sheet for Healthcare Providers:  GravelBags.it  This test is no t yet approved or cleared by the [place] FDA and  has been authorized for detection and/or diagnosis of SARS-CoV-2 by FDA under an Emergency Use Authorization (EUA). This EUA will remain  in effect (meaning this test can be used) for the duration of the COVID-19 declaration under Section 564(b)(1) of the Act, 21 U.S.C. section 360bbb-3(b)(1), unless the authorization is terminated or revoked sooner.     Influenza A by PCR NEGATIVE NEGATIVE Final   Influenza B by PCR NEGATIVE NEGATIVE Final    Comment: (NOTE) The Xpert Xpress SARS-CoV-2/FLU/RSV assay is intended as an aid in  the diagnosis of influenza from Nasopharyngeal swab specimens and  should not be used as a sole basis for treatment. Nasal washings and  aspirates are unacceptable for Xpert Xpress SARS-CoV-2/FLU/RSV  testing.  Fact Sheet for Patients: PinkCheek.be  Fact Sheet for Healthcare Providers: GravelBags.it  This test is not yet approved or cleared by the [place] FDA and  has been authorized for detection and/or diagnosis of SARS-CoV-2 by  FDA under  an Emergency Use Authorization (EUA). This EUA will remain  in effect (meaning this test can be used) for the duration of the  Covid-19 declaration under Section 564(b)(1) of the Act, 21  U.S.C. section 360bbb-3(b)(1), unless the authorization is  terminated or revoked. Performed at [facility], [place] [address], [place], [place] [address]     Coagulation Studies: No results for input(s): LABPROT, INR in the last 72 hours.  Urinalysis: No results for input(s): COLORURINE, LABSPEC, PHURINE, GLUCOSEU, HGBUR, BILIRUBINUR, KETONESUR, PROTEINUR, UROBILINOGEN, NITRITE, LEUKOCYTESUR in the last 72 hours.  Invalid input(s):  APPERANCEUR    Imaging: No results found.   Medications:   . methylPREDNISolone (SOLU-MEDROL) injection Stopped (09/29/20 1240)   . sodium chloride   Intravenous Once  . cholecalciferol  1,000 Units Oral Daily  . famotidine  10 mg Oral Daily  . 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 He does not have any systemic symptoms.Follow-up final biopsy result. QuantiFERON hepatitis studies are negative.  We should proceed with Solu-Medrol and Cytoxan.  He received a dose of Cytoxan 09/29/2020.   We will discharge patient on 60 mg daily of prednisone.  Follow-up with Dr. Carolin Sicks next week.  2.Acute kidney injury, nonoliguric: Treatment as above. Monitor BMP. Avoid nephrotoxins. Strict ins and out.  3.Metabolic acidosis: Treated with IV bicarbonate, lab improved.  Discontinue IVF.  Increase oral intake.  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  FOBT positive, may need GI consult will defer to primary team.  Received 1 unit of blood  transfusion.  IV  5.Hypertension: Resume atenolol. Monitor blood pressure.  6. History of uric acid nephrolithiasis:   Apparently he is off of allopurinol per his urologist.  Uric acid level 7.5    LOS: [place] @TODAY @7 :34 AM

## 2020-09-30 NOTE — Progress Notes (Signed)
RN gave pt discharge instructions to pt and his wife and they stated understanding. IV has been removed and pt belongings have been packed. And TOC dropped off medications

## 2020-10-01 LAB — BPAM RBC
Blood Product Expiration Date: 202112222359
ISSUE DATE / TIME: 202111171236
Unit Type and Rh: 5100

## 2020-10-01 LAB — TYPE AND SCREEN
ABO/RH(D): O POS
Antibody Screen: NEGATIVE
Unit division: 0

## 2020-10-05 DIAGNOSIS — I10 Essential (primary) hypertension: Secondary | ICD-10-CM | POA: Diagnosis not present

## 2020-10-05 DIAGNOSIS — R195 Other fecal abnormalities: Secondary | ICD-10-CM | POA: Diagnosis not present

## 2020-10-05 DIAGNOSIS — N179 Acute kidney failure, unspecified: Secondary | ICD-10-CM | POA: Diagnosis not present

## 2020-10-05 DIAGNOSIS — N019 Rapidly progressive nephritic syndrome with unspecified morphologic changes: Secondary | ICD-10-CM | POA: Diagnosis not present

## 2020-10-05 DIAGNOSIS — D649 Anemia, unspecified: Secondary | ICD-10-CM | POA: Diagnosis not present

## 2020-10-05 DIAGNOSIS — R040 Epistaxis: Secondary | ICD-10-CM | POA: Diagnosis not present

## 2020-10-06 DIAGNOSIS — I714 Abdominal aortic aneurysm, without rupture: Secondary | ICD-10-CM | POA: Diagnosis not present

## 2020-10-06 DIAGNOSIS — D631 Anemia in chronic kidney disease: Secondary | ICD-10-CM | POA: Diagnosis not present

## 2020-10-06 DIAGNOSIS — N057 Unspecified nephritic syndrome with diffuse crescentic glomerulonephritis: Secondary | ICD-10-CM | POA: Diagnosis not present

## 2020-10-06 DIAGNOSIS — N2581 Secondary hyperparathyroidism of renal origin: Secondary | ICD-10-CM | POA: Diagnosis not present

## 2020-10-06 DIAGNOSIS — N189 Chronic kidney disease, unspecified: Secondary | ICD-10-CM | POA: Diagnosis not present

## 2020-10-06 DIAGNOSIS — N179 Acute kidney failure, unspecified: Secondary | ICD-10-CM | POA: Diagnosis not present

## 2020-10-06 DIAGNOSIS — Z87442 Personal history of urinary calculi: Secondary | ICD-10-CM | POA: Diagnosis not present

## 2020-10-06 DIAGNOSIS — I129 Hypertensive chronic kidney disease with stage 1 through stage 4 chronic kidney disease, or unspecified chronic kidney disease: Secondary | ICD-10-CM | POA: Diagnosis not present

## 2020-10-06 DIAGNOSIS — M1000 Idiopathic gout, unspecified site: Secondary | ICD-10-CM | POA: Diagnosis not present

## 2020-10-06 DIAGNOSIS — N058 Unspecified nephritic syndrome with other morphologic changes: Secondary | ICD-10-CM | POA: Diagnosis not present

## 2020-10-12 ENCOUNTER — Other Ambulatory Visit: Payer: Self-pay | Admitting: Nephrology

## 2020-10-12 DIAGNOSIS — N058 Unspecified nephritic syndrome with other morphologic changes: Secondary | ICD-10-CM

## 2020-10-12 DIAGNOSIS — N057 Unspecified nephritic syndrome with diffuse crescentic glomerulonephritis: Secondary | ICD-10-CM

## 2020-10-12 DIAGNOSIS — N179 Acute kidney failure, unspecified: Secondary | ICD-10-CM | POA: Diagnosis not present

## 2020-10-19 DIAGNOSIS — N189 Chronic kidney disease, unspecified: Secondary | ICD-10-CM | POA: Diagnosis not present

## 2020-10-19 DIAGNOSIS — I776 Arteritis, unspecified: Secondary | ICD-10-CM | POA: Diagnosis not present

## 2020-10-20 LAB — SURGICAL PATHOLOGY

## 2020-10-26 DIAGNOSIS — N189 Chronic kidney disease, unspecified: Secondary | ICD-10-CM | POA: Diagnosis not present

## 2020-10-26 DIAGNOSIS — I776 Arteritis, unspecified: Secondary | ICD-10-CM | POA: Diagnosis not present

## 2020-10-27 ENCOUNTER — Encounter (HOSPITAL_COMMUNITY): Payer: Self-pay

## 2020-11-02 ENCOUNTER — Ambulatory Visit
Admission: RE | Admit: 2020-11-02 | Discharge: 2020-11-02 | Disposition: A | Discharge status: Home / Self Care | Payer: PPO | Source: Ambulatory Visit | Attending: Nephrology | Admitting: Nephrology

## 2020-11-02 DIAGNOSIS — J984 Other disorders of lung: Secondary | ICD-10-CM | POA: Diagnosis not present

## 2020-11-02 DIAGNOSIS — I776 Arteritis, unspecified: Secondary | ICD-10-CM | POA: Diagnosis not present

## 2020-11-02 DIAGNOSIS — N189 Chronic kidney disease, unspecified: Secondary | ICD-10-CM | POA: Diagnosis not present

## 2020-11-02 DIAGNOSIS — N057 Unspecified nephritic syndrome with diffuse crescentic glomerulonephritis: Secondary | ICD-10-CM

## 2020-11-02 DIAGNOSIS — J439 Emphysema, unspecified: Secondary | ICD-10-CM | POA: Diagnosis not present

## 2020-11-02 DIAGNOSIS — I2510 Atherosclerotic heart disease of native coronary artery without angina pectoris: Secondary | ICD-10-CM | POA: Diagnosis not present

## 2020-11-02 DIAGNOSIS — R0609 Other forms of dyspnea: Secondary | ICD-10-CM | POA: Diagnosis not present

## 2020-11-02 DIAGNOSIS — N058 Unspecified nephritic syndrome with other morphologic changes: Secondary | ICD-10-CM

## 2020-11-03 NOTE — Discharge Instructions (Signed)

## 2020-11-04 ENCOUNTER — Other Ambulatory Visit: Payer: Self-pay

## 2020-11-04 ENCOUNTER — Encounter (HOSPITAL_COMMUNITY)
Admission: RE | Admit: 2020-11-04 | Discharge: 2020-11-04 | Disposition: A | Discharge status: Home / Self Care | Payer: PPO | Source: Ambulatory Visit | Attending: Nephrology | Admitting: Nephrology

## 2020-11-04 VITALS — BP 146/73 | HR 58 | Temp 97.0°F | Resp 18

## 2020-11-04 DIAGNOSIS — N179 Acute kidney failure, unspecified: Secondary | ICD-10-CM

## 2020-11-04 LAB — POCT HEMOGLOBIN-HEMACUE: Hemoglobin: 8.8 g/dL — ABNORMAL LOW (ref 13.0–17.0)

## 2020-11-04 MED ORDER — EPOETIN ALFA-EPBX 10000 UNIT/ML IJ SOLN: 15000.0000 [IU] | INTRAMUSCULAR | Status: DC

## 2020-11-04 MED ORDER — EPOETIN ALFA-EPBX 2000 UNIT/ML IJ SOLN
INTRAMUSCULAR | Status: AC
  Administered 2020-11-04: 14:00:00 2000 [IU] via SUBCUTANEOUS
  Filled 2020-11-04: qty 1

## 2020-11-04 MED ORDER — EPOETIN ALFA-EPBX 3000 UNIT/ML IJ SOLN
INTRAMUSCULAR | Status: AC
  Administered 2020-11-04: 14:00:00 3000 [IU] via SUBCUTANEOUS
  Filled 2020-11-04: qty 1

## 2020-11-04 MED ORDER — EPOETIN ALFA-EPBX 10000 UNIT/ML IJ SOLN
INTRAMUSCULAR | Status: AC
  Administered 2020-11-04: 14:00:00 10000 [IU] via SUBCUTANEOUS
  Filled 2020-11-04: qty 1

## 2020-11-09 DIAGNOSIS — N189 Chronic kidney disease, unspecified: Secondary | ICD-10-CM | POA: Diagnosis not present

## 2020-11-09 DIAGNOSIS — I776 Arteritis, unspecified: Secondary | ICD-10-CM | POA: Diagnosis not present

## 2020-11-16 DIAGNOSIS — N189 Chronic kidney disease, unspecified: Secondary | ICD-10-CM | POA: Diagnosis not present

## 2020-11-16 DIAGNOSIS — N058 Unspecified nephritic syndrome with other morphologic changes: Secondary | ICD-10-CM | POA: Diagnosis not present

## 2020-11-16 DIAGNOSIS — N057 Unspecified nephritic syndrome with diffuse crescentic glomerulonephritis: Secondary | ICD-10-CM | POA: Diagnosis not present

## 2020-11-16 DIAGNOSIS — N2581 Secondary hyperparathyroidism of renal origin: Secondary | ICD-10-CM | POA: Diagnosis not present

## 2020-11-16 DIAGNOSIS — I776 Arteritis, unspecified: Secondary | ICD-10-CM | POA: Diagnosis not present

## 2020-11-16 DIAGNOSIS — R911 Solitary pulmonary nodule: Secondary | ICD-10-CM | POA: Diagnosis not present

## 2020-11-16 DIAGNOSIS — I129 Hypertensive chronic kidney disease with stage 1 through stage 4 chronic kidney disease, or unspecified chronic kidney disease: Secondary | ICD-10-CM | POA: Diagnosis not present

## 2020-11-16 DIAGNOSIS — E872 Acidosis: Secondary | ICD-10-CM | POA: Diagnosis not present

## 2020-11-16 DIAGNOSIS — N179 Acute kidney failure, unspecified: Secondary | ICD-10-CM | POA: Diagnosis not present

## 2020-11-16 DIAGNOSIS — E875 Hyperkalemia: Secondary | ICD-10-CM | POA: Diagnosis not present

## 2020-11-20 DIAGNOSIS — N08 Glomerular disorders in diseases classified elsewhere: Secondary | ICD-10-CM | POA: Diagnosis not present

## 2020-11-20 DIAGNOSIS — T380X5A Adverse effect of glucocorticoids and synthetic analogues, initial encounter: Secondary | ICD-10-CM | POA: Diagnosis not present

## 2020-11-20 DIAGNOSIS — I10 Essential (primary) hypertension: Secondary | ICD-10-CM | POA: Diagnosis not present

## 2020-11-20 DIAGNOSIS — R7309 Other abnormal glucose: Secondary | ICD-10-CM | POA: Diagnosis not present

## 2020-11-20 DIAGNOSIS — I776 Arteritis, unspecified: Secondary | ICD-10-CM | POA: Diagnosis not present

## 2020-11-23 ENCOUNTER — Other Ambulatory Visit: Payer: Self-pay | Admitting: Hematology and Oncology

## 2020-11-23 DIAGNOSIS — N057 Unspecified nephritic syndrome with diffuse crescentic glomerulonephritis: Secondary | ICD-10-CM | POA: Insufficient documentation

## 2020-11-23 DIAGNOSIS — N179 Acute kidney failure, unspecified: Secondary | ICD-10-CM | POA: Diagnosis not present

## 2020-11-23 NOTE — Progress Notes (Signed)
START OFF PATHWAY REGIMEN - Other   Custom Intervention:Medical: [Cyclophosphamide]:     Cyclophosphamide   **Always confirm dose/schedule in your pharmacy ordering system**  **Administration Notes: Per nephrology.  Patient Characteristics: Intent of Therapy: Non-Curative / Palliative Intent, Discussed with Patient

## 2020-11-25 ENCOUNTER — Other Ambulatory Visit: Payer: Self-pay | Admitting: Hematology and Oncology

## 2020-11-25 NOTE — Progress Notes (Signed)
[facility]  [address], [address] [place], [place] [address] Phone: [phone]  Fax: [phone]   Clinic Day:  11/26/2020  Referring physician: Seward Carol, MD  Chief Complaint: Taylor King is a 70 y.o. 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 Seward Carol for monthly cyclophosphamide.   HPI:  The patient has a history of bilateral kidney stones, hematuria, and proteinuria.  Creatinine increased from 1.56 in 03/23/2020 to 4.8 on 08/12/2020. These labs were done by his urologist because of his history of kidney stones. CT on 08/12/2020 revealed a tiny nonobstructing calculi in the lower pole of the left kidney.  Urinalysis revealed significant hematuria and proteinuria.  He saw Dr. Carolin Sicks on 09/07/2020. Creatinine was 4.8.  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  The patient was admitted to [facility] from 09/26/2020 - 09/30/2020 for worsening renal failure with a recent biopsy confirming necrotizing and sclerosing crescentic glomerulonephritis. Creatinine was 5.22 (CrCl 11 ml/min). Ultrasound on 09/26/2020 revealed echogenic kidneys c/w medical renal disease with no hydronephrosis or stone.  He received IV Solu-Medrol 500 mg daily x3 days. He received Cytoxan 600 mg (7.5 mg/kg) on 09/29/2020 with Mesna pre and post infusion. He was noted to have a macrocytic anemia (hemoglobin 8.0, MCV 104.0); he received 2 units of PRBC and 2 doses of IV iron. Fecal occult blood test was positive; he was to follow up with GI. He is putting off a colonoscopy until the Spring; he typically has a colonoscopy every 3 years due to polyps.  He was to continue prednisone 60 mg daily and Bactrim 3x per week. At discharge, creatinine was 5.64.  The patient saw Dr. Carolin Sicks on  10/06/2020 for a hospital follow-up. He reported epistaxis and dyspnea on exertion. Hematocrit was 31.2, hemoglobin 10.2, MCV 97.0, platelets 325,000, WBC 18,200. Ferritin was 1,131 with an iron saturation of 69% and a TIBC of 260.  BUN was 91 with a creatinine of 4.02 (CrCl 14 ml/min).  Potassium was 5.0.  Absolute ANCA was >100, cytoplasmic ANCA was 1:640 (titer). Urinalysis showed 1+ protein and 3+ occult blood. He was to continue prednisone with plans for gradual taper.  He is currently taking 20 mg/day prednisone.  Labs on 10/12/2020 included a hematocrit was 32.5, hemoglobin 10.7, MCV 98.0, platelets 311,000, and WBC 19,800. BUN was 89 and creatinine 3.80 (CrCl 15 ml/min). Potassium was 6.6.  The patient saw Dr. Carolin Sicks on 11/04/2020. Hemoglobin was 8.8. He received Retacrit 15,000 units.  The patient saw Dr. Carolin Sicks on 11/16/2020. He is scheduled to receive Retacrit on 12/02/2020.  Symptomatically, he feels "pretty good." He feels tired all the time. He has had shortness of breath with minimal exertion for 3-4 months. It has worsened in the past couple of weeks and he cannot walk from one side of the house to the other without being short of breath.  His vision is worsening, but he thinks it is because his face is swollen due to steroids. His right foot is swollen, tingling, and numb. The patient has been on insulin for 3 days because his blood sugar was recently in the 500s. Now, it is running around 300-350. He has had 2 falls in the past 2 weeks.  The patient denies fevers, sweats, headaches, runny nose, sore throat, cough, chest pain, palpitations, nausea, vomiting, diarrhea, reflux, urinary symptoms, gross hematuria, skin changes, numbness, weakness,  and balance or coordination problems.   Past Medical History:  Diagnosis Date  . Arthritis   . Chronic back pain   . Diverticulitis   . Dyspnea    with exertion  . GERD (gastroesophageal reflux disease)    occ  . History of kidney  stones   . Peripheral vascular disease ([place])    aaa 39 mm 02-14-17 epic [place]    Past Surgical History:  Procedure Laterality Date  . adenoids removed   age 86  . ANTERIOR LAT LUMBAR FUSION N/A 03/26/2020   Procedure: ANTERIOR LATERAL LUMBAR FUSION (XLIF) LUMBAR TWO-THREE WITH POSTERIOR SPINAL FUSION INTERBODY LUMBAR TWO-THREE;  Surgeon: Melina Schools, MD;  Location: [facility];  Service: Orthopedics;  Laterality: N/A;  4 hrs Left tap block with exparel  . COLONOSCOPY WITH PROPOFOL N/A 03/07/2017   Procedure: COLONOSCOPY WITH PROPOFOL;  Surgeon: Garlan Fair, MD;  Location: [facility];  Service: Endoscopy;  Laterality: N/A;  . colonscopy     x 3  . KNEE ARTHROSCOPY  [id number]   left knee  . TONSILLECTOMY  age 13  . TOTAL KNEE ARTHROPLASTY  11/25/2011   Procedure: TOTAL KNEE ARTHROPLASTY;  Surgeon: Newt Minion, MD;  Location: [facility];  Service: Orthopedics;  Laterality: Left;  Left Total Knee Arthroplasty    History reviewed. No pertinent family history.  His mother had a type of blood cancer but they cannot remember the name.  Social History:  reports that he has quit smoking. His smoking use included cigarettes. He has a 7.50 pack-year smoking history. He has never used smokeless tobacco. He reports that he does not drink alcohol and does not use drugs.He denies alcohol and tobacco use. He smoked a couple packs per week and quit 6-7 years ago. He denies any exposure to radiation or toxins. He is a retired [employer]. The patient is his wife, Opal Sidles, today.  Allergies:  Allergies  Allergen Reactions  . Other Other (See Comments)    Anesthesia medication (pt unsure of which med)- reaction upset stomach. (after colonoscopy)    Current Medications: Current Outpatient Medications  Medication Sig Dispense Refill  . atenolol (TENORMIN) 25 MG tablet Take by mouth.    . Blood Glucose Monitoring Suppl (ACCU-CHEK AVIVA PLUS) w/Device KIT Use to check your blood sugar    . Cholecalciferol  (VITAMIN D) 50 MCG (2000 UT) CAPS 1 capsule    . glucose blood (ONETOUCH VERIO) test strip use to check your blood sugar    . LEVEMIR FLEXTOUCH 100 UNIT/ML FlexPen Inject into the skin.    . predniSONE (DELTASONE) 20 MG tablet 1 tablet    . sulfamethoxazole-trimethoprim (BACTRIM DS) 800-160 MG tablet Take 1 tablet by mouth 3 (three) times a week. 36 tablet 0  . vitamin B-12 (CYANOCOBALAMIN) 1000 MCG tablet Take 1,000 mcg by mouth daily.    Marland Kitchen acetaminophen (TYLENOL) 500 MG tablet Take 1,000 mg by mouth every 6 (six) hours as needed for moderate pain or headache. (Patient not taking: Reported on 11/26/2020)    . aspirin EC 325 MG tablet Take 325 mg by mouth daily as needed (headache). (Patient not taking: Reported on 11/26/2020)    . Aspirin-Acetaminophen (GOODYS BODY PAIN PO) Take 1 packet by mouth daily as needed (pain). (Patient not taking: Reported on 11/26/2020)    . famotidine (PEPCID) 10 MG tablet Take 1 tablet (10 mg total) by mouth daily. (Patient not taking: Reported on 11/26/2020) 90 tablet 0   No current facility-administered medications for  this visit.    Review of Systems  Constitutional: Positive for malaise/fatigue. Negative for chills, diaphoresis, fever and weight loss.       Feels "pretty good."  HENT: Negative for congestion, ear discharge, ear pain, hearing loss, nosebleeds, sinus pain, sore throat and tinnitus.   Eyes: Negative for blurred vision and double vision.       Vision changes due to face swelling.  Respiratory: Positive for shortness of breath (with minimal exertion, worsening). Negative for cough, hemoptysis and sputum production.   Cardiovascular: Positive for leg swelling (right). Negative for chest pain and palpitations.  Gastrointestinal: Negative for abdominal pain, blood in stool, constipation, diarrhea, heartburn, melena, nausea and vomiting.  Genitourinary: Negative for dysuria, frequency, hematuria and urgency.  Musculoskeletal: Positive for falls (2 falls  in the past 2 weeks). Negative for back pain, joint pain, myalgias and neck pain.  Skin: Negative for itching and rash.  Neurological: Positive for sensory change (tingling/numbness in right foot). Negative for dizziness, weakness and headaches.  Endo/Heme/Allergies: Does not bruise/bleed easily.       On insulin.  Psychiatric/Behavioral: Negative for depression and memory loss. The patient is not nervous/anxious and does not have insomnia.   All other systems reviewed and are negative.  Performance status (ECOG): 1  Vitals Blood pressure (!) 142/77, pulse 67, temperature 98.2 F (36.8 C), temperature source Tympanic, resp. rate 16, weight 187 lb 2.7 oz (84.9 kg), SpO2 98 %.   Physical Exam Vitals and nursing note reviewed.  Constitutional:      General: He is not in acute distress.    Appearance: He is not diaphoretic.  HENT:     Head: Normocephalic and atraumatic.     Comments: Gray hair.    Mouth/Throat:     Mouth: Mucous membranes are moist.     Pharynx: Oropharynx is clear.  Eyes:     General: No scleral icterus.    Extraocular Movements: Extraocular movements intact.     Conjunctiva/sclera: Conjunctivae normal.     Pupils: Pupils are equal, round, and reactive to light.     Comments: Oval glasses.  Blue eyes.  Cardiovascular:     Rate and Rhythm: Normal rate and regular rhythm.     Heart sounds: Normal heart sounds. No murmur heard.   Pulmonary:     Effort: Pulmonary effort is normal. No respiratory distress.     Breath sounds: Normal breath sounds. No wheezing or rales.  Chest:     Chest wall: No tenderness.  Breasts:     Right: No axillary adenopathy or supraclavicular adenopathy.     Left: No axillary adenopathy or supraclavicular adenopathy.    Abdominal:     General: Bowel sounds are normal. There is no distension.     Palpations: Abdomen is soft. There is no mass.     Tenderness: There is no abdominal tenderness. There is no guarding or rebound.   Musculoskeletal:        General: No swelling or tenderness. Normal range of motion.     Cervical back: Normal range of motion and neck supple.  Lymphadenopathy:     Head:     Right side of head: No preauricular, posterior auricular or occipital adenopathy.     Left side of head: No preauricular, posterior auricular or occipital adenopathy.     Cervical: No cervical adenopathy.     Upper Body:     Right upper body: No supraclavicular or axillary adenopathy.     Left upper body:  No supraclavicular or axillary adenopathy.     Lower Body: No right inguinal adenopathy. No left inguinal adenopathy.  Skin:    General: Skin is warm and dry.  Neurological:     Mental Status: He is alert and oriented to person, place, and time.  Psychiatric:        Behavior: Behavior normal.        Thought Content: Thought content normal.        Judgment: Judgment normal.    Appointment on 11/26/2020  Component Date Value Ref Range Status  . Sodium 11/26/2020 142  135 - 145 mmol/L Final  . Potassium 11/26/2020 4.1  3.5 - 5.1 mmol/L Final  . Chloride 11/26/2020 104  98 - 111 mmol/L Final  . CO2 11/26/2020 24  22 - 32 mmol/L Final  . Glucose, Bld 11/26/2020 85  70 - 99 mg/dL Final   Glucose reference range applies only to samples taken after fasting for at least 8 hours.  . BUN 11/26/2020 61* 8 - 23 mg/dL Final  . Creatinine, Ser 11/26/2020 4.26* 0.61 - 1.24 mg/dL Final  . Calcium 11/26/2020 8.3* 8.9 - 10.3 mg/dL Final  . Total Protein 11/26/2020 5.8* 6.5 - 8.1 g/dL Final  . Albumin 11/26/2020 3.1* 3.5 - 5.0 g/dL Final  . AST 11/26/2020 17  15 - 41 U/L Final  . ALT 11/26/2020 23  0 - 44 U/L Final  . Alkaline Phosphatase 11/26/2020 48  38 - 126 U/L Final  . Total Bilirubin 11/26/2020 0.3  0.3 - 1.2 mg/dL Final  . GFR, Estimated 11/26/2020 14* >60 mL/min Final   Comment: (NOTE) Calculated using the CKD-EPI Creatinine Equation (2021)   . Anion gap 11/26/2020 14  5 - 15 Final   Performed at [facility], [address] [place], [place] [address]  . WBC 11/26/2020 8.6  4.0 - 10.5 K/uL Final  . RBC 11/26/2020 2.35* 4.22 - 5.81 MIL/uL Final  . Hemoglobin 11/26/2020 7.6* 13.0 - 17.0 g/dL Final  . HCT 11/26/2020 23.9* 39.0 - 52.0 % Final  . MCV 11/26/2020 101.7* 80.0 - 100.0 fL Final  . MCH 11/26/2020 32.3  26.0 - 34.0 pg Final  . MCHC 11/26/2020 31.8  30.0 - 36.0 g/dL Final  . RDW 11/26/2020 15.0  11.5 - 15.5 % Final  . Platelets 11/26/2020 161  150 - 400 K/uL Final  . nRBC 11/26/2020 0.5* 0.0 - 0.2 % Final  . Neutrophils Relative % 11/26/2020 71  % Final  . Neutro Abs 11/26/2020 6.2  1.7 - 7.7 K/uL Final  . Lymphocytes Relative 11/26/2020 16  % Final  . Lymphs Abs 11/26/2020 1.3  0.7 - 4.0 K/uL Final  . Monocytes Relative 11/26/2020 8  % Final  . Monocytes Absolute 11/26/2020 0.6  0.1 - 1.0 K/uL Final  . Eosinophils Relative 11/26/2020 0  % Final  . Eosinophils Absolute 11/26/2020 0.0  0.0 - 0.5 K/uL Final  . Basophils Relative 11/26/2020 0  % Final  . Basophils Absolute 11/26/2020 0.0  0.0 - 0.1 K/uL Final  . Immature Granulocytes 11/26/2020 5  % Final  . Abs Immature Granulocytes 11/26/2020 0.42* 0.00 - 0.07 K/uL Final   Performed at [facility], [address] [place], [place] [address]    Assessment:  Taylor King is a 70 y.o. 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 Creatinine increased from 1.56 in 03/2020 to 4.8 on 08/12/2020.  Urinalysis revealed significant hematuria and proteinuria.  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  Renal ultrasound  on 09/26/2020 revealed echogenic kidneys c/w medical renal disease with no hydronephrosis or stone.    He was admitted to [facility] from 09/26/2020 - 09/30/2020 for worsening renal failure.  He received IV Solu-Medrol 500 mg daily x3  days. He received Cytoxan 600 mg (7.5 mg/kg) on 09/29/2020 with Mesna pre and post. He was discharged on prednisone 60 mg daily and Bactrim 3x per week. He is on prednisone 20 mg/day.  Creatinine has been followed: 5.22 on 09/26/2020, 5.64 on 09/30/2020, 4.02 on 10/06/2020 and 3.80 on 10/12/2020.  He has anemia secondary to chronic kidney disease.  He received Retacrit 15,000 units on 11/16/2020.  Symptomatically, he feels good.  He denies any bleeding.  Exam is unremarkable.  Hemoglobin is 7.6.  Creatinine is 4.26.  Plan: 1.   Labs today:  CBC with diff, CMP. 2.   Pauci-immune necrotizing and sclerosing crescentic glomerulonephritis  Discuss plan for continued Cytoxan.  Orders discussed with Dr Carolin Sicks and Dr Edrick Oh.  Cytoxan 600 mg IV with pre and post Mesna 200 mg IV.  Hydration NS 100 cc/hr.  Total volume 1 liter (fluids + treatment).  Patient consented to treatment 3.   Anemia secondary to chronic kidney disease  He denies any bleeding.  He received 2 units of PRBCs and 2 IV iron infusions while hospitalized at [facility].  He received Retacrit on 11/16/2020.  Dr Carolin Sicks contacted- he is to be set up for transfusion and additional Retacrit.   Patient informed. 4.   RN: Fax labs to Dr Carolin Sicks. 5.   Cytoxan today. 6.   RTC prn.  I discussed the assessment and treatment plan with the patient.  The patient was provided an opportunity to ask questions and all were answered.  The patient agreed with the plan and demonstrated an understanding of the instructions.  The patient was advised to call back if the symptoms worsen or if the condition fails to improve as anticipated.  I provided 22 minutes of face-to-face time during this this encounter and > 50% was spent counseling as documented under my assessment and plan.  An additional 60+ minutes were spent reviewing his chart (Epic and Care Everywhere) including notes, labs, and imaging studies, discussing his chemotherapy with Dr  Carolin Sicks x 2 and Dr Justin Mend x 1, reviewing orders with pharmacy [facility], [facility]) and writing chemotherapy orders.    Sophi Calligan C. Mike Gip, MD, PhD    11/26/2020, 9:25 AM  I, Mirian Mo Tufford, am acting as Education administrator for [employer]. Mike Gip, MD, PhD.  I, Zakai Gonyea C. Mike Gip, MD, have reviewed the above documentation for accuracy and completeness, and I agree with the above.

## 2020-11-26 ENCOUNTER — Telehealth: Payer: Self-pay

## 2020-11-26 ENCOUNTER — Other Ambulatory Visit: Payer: Self-pay

## 2020-11-26 ENCOUNTER — Inpatient Hospital Stay: Payer: PPO | Attending: Hematology and Oncology

## 2020-11-26 ENCOUNTER — Inpatient Hospital Stay: Payer: PPO

## 2020-11-26 ENCOUNTER — Inpatient Hospital Stay: Payer: PPO | Admitting: Hematology and Oncology

## 2020-11-26 ENCOUNTER — Encounter: Payer: Self-pay | Admitting: Hematology and Oncology

## 2020-11-26 VITALS — BP 139/84 | HR 70 | Resp 18

## 2020-11-26 VITALS — BP 142/77 | HR 67 | Temp 98.2°F | Resp 16 | Wt 187.2 lb

## 2020-11-26 DIAGNOSIS — Z87442 Personal history of urinary calculi: Secondary | ICD-10-CM | POA: Insufficient documentation

## 2020-11-26 DIAGNOSIS — D631 Anemia in chronic kidney disease: Secondary | ICD-10-CM

## 2020-11-26 DIAGNOSIS — N057 Unspecified nephritic syndrome with diffuse crescentic glomerulonephritis: Secondary | ICD-10-CM

## 2020-11-26 DIAGNOSIS — Z7952 Long term (current) use of systemic steroids: Secondary | ICD-10-CM | POA: Diagnosis not present

## 2020-11-26 DIAGNOSIS — Z5111 Encounter for antineoplastic chemotherapy: Secondary | ICD-10-CM | POA: Diagnosis not present

## 2020-11-26 DIAGNOSIS — R195 Other fecal abnormalities: Secondary | ICD-10-CM | POA: Diagnosis not present

## 2020-11-26 DIAGNOSIS — N189 Chronic kidney disease, unspecified: Secondary | ICD-10-CM | POA: Diagnosis not present

## 2020-11-26 DIAGNOSIS — N058 Unspecified nephritic syndrome with other morphologic changes: Secondary | ICD-10-CM

## 2020-11-26 LAB — CBC WITH DIFFERENTIAL/PLATELET
Abs Immature Granulocytes: 0.42 10*3/uL — ABNORMAL HIGH (ref 0.00–0.07)
Basophils Absolute: 0 10*3/uL (ref 0.0–0.1)
Basophils Relative: 0 %
Eosinophils Absolute: 0 10*3/uL (ref 0.0–0.5)
Eosinophils Relative: 0 %
HCT: 23.9 % — ABNORMAL LOW (ref 39.0–52.0)
Hemoglobin: 7.6 g/dL — ABNORMAL LOW (ref 13.0–17.0)
Immature Granulocytes: 5 %
Lymphocytes Relative: 16 %
Lymphs Abs: 1.3 10*3/uL (ref 0.7–4.0)
MCH: 32.3 pg (ref 26.0–34.0)
MCHC: 31.8 g/dL (ref 30.0–36.0)
MCV: 101.7 fL — ABNORMAL HIGH (ref 80.0–100.0)
Monocytes Absolute: 0.6 10*3/uL (ref 0.1–1.0)
Monocytes Relative: 8 %
Neutro Abs: 6.2 10*3/uL (ref 1.7–7.7)
Neutrophils Relative %: 71 %
Platelets: 161 10*3/uL (ref 150–400)
RBC: 2.35 MIL/uL — ABNORMAL LOW (ref 4.22–5.81)
RDW: 15 % (ref 11.5–15.5)
WBC: 8.6 10*3/uL (ref 4.0–10.5)
nRBC: 0.5 % — ABNORMAL HIGH (ref 0.0–0.2)

## 2020-11-26 LAB — COMPREHENSIVE METABOLIC PANEL
ALT: 23 U/L (ref 0–44)
AST: 17 U/L (ref 15–41)
Albumin: 3.1 g/dL — ABNORMAL LOW (ref 3.5–5.0)
Alkaline Phosphatase: 48 U/L (ref 38–126)
Anion gap: 14 (ref 5–15)
BUN: 61 mg/dL — ABNORMAL HIGH (ref 8–23)
CO2: 24 mmol/L (ref 22–32)
Calcium: 8.3 mg/dL — ABNORMAL LOW (ref 8.9–10.3)
Chloride: 104 mmol/L (ref 98–111)
Creatinine, Ser: 4.26 mg/dL — ABNORMAL HIGH (ref 0.61–1.24)
GFR, Estimated: 14 mL/min — ABNORMAL LOW (ref >60)
Glucose, Bld: 85 mg/dL (ref 70–99)
Potassium: 4.1 mmol/L (ref 3.5–5.1)
Sodium: 142 mmol/L (ref 135–145)
Total Bilirubin: 0.3 mg/dL (ref 0.3–1.2)
Total Protein: 5.8 g/dL — ABNORMAL LOW (ref 6.5–8.1)

## 2020-11-26 MED ORDER — SODIUM CHLORIDE 0.9 % IV SOLN
600.0000 mg | Freq: Once | INTRAVENOUS | Status: AC
  Administered 2020-11-26: 600 mg via INTRAVENOUS
  Filled 2020-11-26: qty 30

## 2020-11-26 MED ORDER — SODIUM CHLORIDE 0.9 % IV SOLN
Freq: Once | INTRAVENOUS | Status: AC
  Filled 2020-11-26: qty 250

## 2020-11-26 MED ORDER — SODIUM CHLORIDE 0.9 % IV SOLN
200.0000 mg | Freq: Once | INTRAVENOUS | Status: AC
  Administered 2020-11-26: 200 mg via INTRAVENOUS
  Filled 2020-11-26: qty 2

## 2020-11-26 MED ORDER — ONDANSETRON HCL 4 MG PO TABS
4.0000 mg | ORAL_TABLET | Freq: Once | ORAL | Status: AC
  Administered 2020-11-26: 4 mg via ORAL
  Filled 2020-11-26: qty 1

## 2020-11-26 MED ORDER — SODIUM CHLORIDE 0.9 % IV SOLN
200.0000 mg | Freq: Once | INTRAVENOUS | Status: AC
Start: 2020-11-26 — End: 2020-11-26
  Administered 2020-11-26: 200 mg via INTRAVENOUS
  Filled 2020-11-26: qty 2

## 2020-11-26 NOTE — Progress Notes (Signed)
Ok to treat with Hgb per MD

## 2020-11-26 NOTE — Progress Notes (Signed)
Pt received prescribed treatment in clinic, pt stable at d/c. 

## 2020-11-27 DIAGNOSIS — D631 Anemia in chronic kidney disease: Secondary | ICD-10-CM | POA: Insufficient documentation

## 2020-11-30 ENCOUNTER — Other Ambulatory Visit (HOSPITAL_COMMUNITY): Payer: Self-pay

## 2020-12-02 ENCOUNTER — Other Ambulatory Visit: Payer: Self-pay

## 2020-12-02 ENCOUNTER — Ambulatory Visit (HOSPITAL_COMMUNITY)
Admission: RE | Admit: 2020-12-02 | Discharge: 2020-12-02 | Disposition: A | Discharge status: Home / Self Care | Payer: PPO | Source: Ambulatory Visit | Attending: Nephrology | Admitting: Nephrology

## 2020-12-02 VITALS — BP 151/79 | HR 60 | Temp 97.7°F | Resp 18

## 2020-12-02 DIAGNOSIS — N179 Acute kidney failure, unspecified: Secondary | ICD-10-CM | POA: Diagnosis not present

## 2020-12-02 DIAGNOSIS — D631 Anemia in chronic kidney disease: Secondary | ICD-10-CM | POA: Insufficient documentation

## 2020-12-02 LAB — POCT HEMOGLOBIN-HEMACUE: Hemoglobin: 7 g/dL — ABNORMAL LOW (ref 13.0–17.0)

## 2020-12-02 LAB — IRON AND TIBC
Iron: 104 ug/dL (ref 45–182)
Saturation Ratios: 37 % (ref 17.9–39.5)
TIBC: 280 ug/dL (ref 250–450)
UIBC: 176 ug/dL

## 2020-12-02 LAB — FERRITIN: Ferritin: 1047 ng/mL — ABNORMAL HIGH (ref 24–336)

## 2020-12-02 MED ORDER — EPOETIN ALFA-EPBX 10000 UNIT/ML IJ SOLN
INTRAMUSCULAR | Status: AC
  Filled 2020-12-02: qty 1

## 2020-12-02 MED ORDER — EPOETIN ALFA-EPBX 3000 UNIT/ML IJ SOLN
INTRAMUSCULAR | Status: AC
  Filled 2020-12-02: qty 1

## 2020-12-02 MED ORDER — EPOETIN ALFA-EPBX 10000 UNIT/ML IJ SOLN
15000.0000 [IU] | INTRAMUSCULAR | Status: DC
  Administered 2020-12-02: 15000 [IU] via SUBCUTANEOUS

## 2020-12-02 MED ORDER — EPOETIN ALFA-EPBX 2000 UNIT/ML IJ SOLN
INTRAMUSCULAR | Status: AC
  Filled 2020-12-02: qty 1

## 2020-12-04 ENCOUNTER — Other Ambulatory Visit: Payer: Self-pay

## 2020-12-04 ENCOUNTER — Ambulatory Visit (HOSPITAL_COMMUNITY)
Admission: RE | Admit: 2020-12-04 | Discharge: 2020-12-04 | Disposition: A | Discharge status: Home / Self Care | Payer: PPO | Source: Ambulatory Visit | Attending: Nephrology | Admitting: Nephrology

## 2020-12-04 DIAGNOSIS — N184 Chronic kidney disease, stage 4 (severe): Secondary | ICD-10-CM | POA: Insufficient documentation

## 2020-12-04 DIAGNOSIS — D631 Anemia in chronic kidney disease: Secondary | ICD-10-CM | POA: Diagnosis not present

## 2020-12-04 LAB — TYPE AND SCREEN
ABO/RH(D): O POS
Antibody Screen: NEGATIVE
Unit division: 0

## 2020-12-04 LAB — BPAM RBC
Blood Product Expiration Date: 202202252359
Unit Type and Rh: 5100

## 2020-12-04 LAB — PREPARE RBC (CROSSMATCH)

## 2020-12-04 MED ORDER — SODIUM CHLORIDE 0.9% IV SOLUTION: Freq: Once | INTRAVENOUS | Status: DC

## 2020-12-05 LAB — TYPE AND SCREEN
ABO/RH(D): O POS
Antibody Screen: NEGATIVE
Unit division: 0

## 2020-12-05 LAB — BPAM RBC
Blood Product Expiration Date: 202202222359
ISSUE DATE / TIME: 202201210945
Unit Type and Rh: 5100

## 2020-12-07 DIAGNOSIS — N189 Chronic kidney disease, unspecified: Secondary | ICD-10-CM | POA: Diagnosis not present

## 2020-12-07 DIAGNOSIS — N179 Acute kidney failure, unspecified: Secondary | ICD-10-CM | POA: Diagnosis not present

## 2020-12-10 DIAGNOSIS — E875 Hyperkalemia: Secondary | ICD-10-CM | POA: Diagnosis not present

## 2020-12-10 DIAGNOSIS — N058 Unspecified nephritic syndrome with other morphologic changes: Secondary | ICD-10-CM | POA: Diagnosis not present

## 2020-12-10 DIAGNOSIS — E872 Acidosis: Secondary | ICD-10-CM | POA: Diagnosis not present

## 2020-12-10 DIAGNOSIS — N057 Unspecified nephritic syndrome with diffuse crescentic glomerulonephritis: Secondary | ICD-10-CM | POA: Diagnosis not present

## 2020-12-10 DIAGNOSIS — D631 Anemia in chronic kidney disease: Secondary | ICD-10-CM | POA: Diagnosis not present

## 2020-12-10 DIAGNOSIS — N2581 Secondary hyperparathyroidism of renal origin: Secondary | ICD-10-CM | POA: Diagnosis not present

## 2020-12-10 DIAGNOSIS — N179 Acute kidney failure, unspecified: Secondary | ICD-10-CM | POA: Diagnosis not present

## 2020-12-10 DIAGNOSIS — M1000 Idiopathic gout, unspecified site: Secondary | ICD-10-CM | POA: Diagnosis not present

## 2020-12-10 DIAGNOSIS — I129 Hypertensive chronic kidney disease with stage 1 through stage 4 chronic kidney disease, or unspecified chronic kidney disease: Secondary | ICD-10-CM | POA: Diagnosis not present

## 2020-12-11 ENCOUNTER — Institutional Professional Consult (permissible substitution): Payer: PPO | Admitting: Pulmonary Disease

## 2020-12-14 DIAGNOSIS — N179 Acute kidney failure, unspecified: Secondary | ICD-10-CM | POA: Diagnosis not present

## 2020-12-14 DIAGNOSIS — N189 Chronic kidney disease, unspecified: Secondary | ICD-10-CM | POA: Diagnosis not present

## 2020-12-16 ENCOUNTER — Other Ambulatory Visit: Payer: Self-pay

## 2020-12-16 ENCOUNTER — Encounter (HOSPITAL_COMMUNITY)
Admission: RE | Admit: 2020-12-16 | Discharge: 2020-12-16 | Disposition: A | Discharge status: Home / Self Care | Payer: PPO | Source: Ambulatory Visit | Attending: Nephrology | Admitting: Nephrology

## 2020-12-16 ENCOUNTER — Encounter (HOSPITAL_COMMUNITY): Payer: PPO

## 2020-12-16 VITALS — BP 134/75 | HR 67 | Temp 97.4°F | Resp 18

## 2020-12-16 DIAGNOSIS — N179 Acute kidney failure, unspecified: Secondary | ICD-10-CM | POA: Diagnosis not present

## 2020-12-16 DIAGNOSIS — D631 Anemia in chronic kidney disease: Secondary | ICD-10-CM | POA: Diagnosis not present

## 2020-12-16 LAB — POCT HEMOGLOBIN-HEMACUE: Hemoglobin: 8 g/dL — ABNORMAL LOW (ref 13.0–17.0)

## 2020-12-16 MED ORDER — EPOETIN ALFA-EPBX 10000 UNIT/ML IJ SOLN
INTRAMUSCULAR | Status: AC
  Filled 2020-12-16: qty 2

## 2020-12-16 MED ORDER — CLONIDINE HCL 0.1 MG PO TABS: 0.1000 mg | ORAL_TABLET | Freq: Once | ORAL | Status: DC | PRN

## 2020-12-16 MED ORDER — EPOETIN ALFA-EPBX 10000 UNIT/ML IJ SOLN
15000.0000 [IU] | INTRAMUSCULAR | Status: DC
  Administered 2020-12-16: 15000 [IU] via SUBCUTANEOUS

## 2020-12-21 DIAGNOSIS — N189 Chronic kidney disease, unspecified: Secondary | ICD-10-CM | POA: Diagnosis not present

## 2020-12-21 DIAGNOSIS — N179 Acute kidney failure, unspecified: Secondary | ICD-10-CM | POA: Diagnosis not present

## 2020-12-25 ENCOUNTER — Encounter: Payer: Self-pay | Admitting: Pulmonary Disease

## 2020-12-25 ENCOUNTER — Ambulatory Visit: Payer: PPO | Admitting: Pulmonary Disease

## 2020-12-25 ENCOUNTER — Other Ambulatory Visit: Payer: Self-pay

## 2020-12-25 VITALS — BP 132/68 | HR 66 | Temp 97.2°F | Ht 69.0 in | Wt 183.4 lb

## 2020-12-25 DIAGNOSIS — I776 Arteritis, unspecified: Secondary | ICD-10-CM | POA: Diagnosis not present

## 2020-12-25 DIAGNOSIS — R0602 Shortness of breath: Secondary | ICD-10-CM

## 2020-12-25 DIAGNOSIS — R911 Solitary pulmonary nodule: Secondary | ICD-10-CM

## 2020-12-25 DIAGNOSIS — J439 Emphysema, unspecified: Secondary | ICD-10-CM

## 2020-12-25 DIAGNOSIS — J849 Interstitial pulmonary disease, unspecified: Secondary | ICD-10-CM | POA: Diagnosis not present

## 2020-12-25 DIAGNOSIS — I7782 Antineutrophilic cytoplasmic antibody (ANCA) vasculitis: Secondary | ICD-10-CM

## 2020-12-25 MED ORDER — ALBUTEROL SULFATE HFA 108 (90 BASE) MCG/ACT IN AERS: 2.0000 | INHALATION_SPRAY | Freq: Four times a day (QID) | RESPIRATORY_TRACT | 6 refills | Status: DC | PRN

## 2020-12-25 NOTE — Progress Notes (Signed)
Synopsis: Referred in January 2022 by Rosita Fire, MD for shortness of breath  Subjective:   PATIENT ID: Taylor King GENDER: male DOB: 12/25/1950, MRN: [id number]   HPI  Chief Complaint  Patient presents with  . Consult    Referred by nephrologist for worsening dyspnea with exertion X4 mos.    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 He was discharged on 09/30/20 on 6092mf prednisone daily. He was tapered down to 31m37m prednisone by 11/16/20 with his Nephrologist. He reports not being able to get his second dose of cytoxan due to issues with the infusion scheduling. As of today he still has not received his second dose of cytoxan.   In regards to his shortness of breath, he did have some dyspnea on exertion prior to his admission but over the past month his dyspnea has progressed.   He denies cough or hemoptysis. He denies wheezing or chest tightness.   CT Chest 11/02/20 shows mild emphysematous changes. There is scarring of the lingula. There is a 3mm 18mule in the left upper lobe and a 4mm n2mle in the left apex.   Past Medical History:  Diagnosis Date  . Arthritis   . Chronic back pain   . Chronic kidney disease   . Diabetes mellitus without complication (HCC)  BisonDiverticulitis   . Dyspnea    with exertion  . GERD (gastroesophageal reflux disease)    occ  . History of kidney stones   . Peripheral vascular disease (HCC)  [place] 39 mm 02-14-17 epic us    [place] History  Problem Relation Age of Onset  . Emphysema Father      Social History    Socioeconomic History  . Marital status: Married    Spouse name: Not on file  . Number of children: Not on file  . Years of education: Not on file  . Highest education level: Not on file  Occupational History  . Not on file  Tobacco Use  . Smoking status: Former Smoker    Packs/day: 0.25    Years: 30.00    Pack years: 7.50    Types: Cigarettes    Quit date: 12/25/2013    Years since quitting: 7.0  . Smokeless tobacco: Never Used  Vaping Use  . Vaping Use: Never used  Substance and Sexual Activity  . Alcohol use: No  . Drug use: No  . Sexual activity: Not on file  Other Topics Concern  . Not on file  Social History Narrative  . Not on file   Social Determinants of Health   Financial Resource Strain: Not on file  Food Insecurity: Not on file  Transportation Needs: Not on file  Physical Activity: Not on file  Stress: Not on file  Social Connections: Not on file  Intimate Partner Violence: Not on file     Allergies  Allergen Reactions  . Other Other (See Comments)    Anesthesia medication (pt unsure of which med)- reaction upset stomach. (after colonoscopy)     Outpatient Medications Prior to Visit  Medication Sig Dispense Refill  . atenolol (TENORMIN) 25 MG tablet Take by mouth.    . Blood Glucose Monitoring Suppl (ACCU-CHEK AVIVA PLUS) w/Device KIT Use to check your blood sugar    . Cholecalciferol (VITAMIN D) 50 MCG (2000 UT) CAPS 1 capsule    . glucose blood (ONETOUCH VERIO) test strip use to check your blood sugar    . insulin aspart (NOVOLOG) 100 UNIT/ML injection Inject 5 Units into the skin 3 (three) times daily before meals.    Marland Kitchen LEVEMIR FLEXTOUCH 100 UNIT/ML FlexPen Inject into the skin.    Marland Kitchen LOKELMA 10 g PACK packet Take 1 packet by mouth daily.    . vitamin B-12 (CYANOCOBALAMIN) 1000 MCG tablet Take 1,000 mcg by mouth daily.    Marland Kitchen acetaminophen (TYLENOL) 500 MG tablet Take 1,000 mg by mouth every 6 (six) hours as needed for moderate pain or headache.  (Patient not taking: No sig reported)    . aspirin EC 325 MG tablet Take 325 mg by mouth daily as needed (headache). (Patient not taking: No sig reported)    . Aspirin-Acetaminophen (GOODYS BODY PAIN PO) Take 1 packet by mouth daily as needed (pain). (Patient not taking: No sig reported)    . famotidine (PEPCID) 10 MG tablet Take 1 tablet (10 mg total) by mouth daily. (Patient not taking: No sig reported) 90 tablet 0  . predniSONE (DELTASONE) 20 MG tablet 1 tablet (Patient not taking: Reported on 12/25/2020)    . sulfamethoxazole-trimethoprim (BACTRIM DS) 800-160 MG tablet Take 1 tablet by mouth 3 (three) times a week. (Patient not taking: Reported on 12/25/2020) 36 tablet 0   No facility-administered medications prior to visit.    Review of Systems  Constitutional: Positive for malaise/fatigue. Negative for chills, fever and weight loss.  HENT: Negative for congestion, sinus pain and sore throat.   Eyes: Negative.   Respiratory: Positive for shortness of breath. Negative for cough, hemoptysis, sputum production and wheezing.   Cardiovascular: Negative for chest pain, palpitations, orthopnea, claudication and leg swelling.  Gastrointestinal: Negative for abdominal pain, heartburn, nausea and vomiting.  Genitourinary: Negative.   Musculoskeletal: Negative for joint pain and myalgias.  Skin: Negative for rash.  Neurological: Positive for dizziness. Negative for weakness.  Endo/Heme/Allergies: Negative.   Psychiatric/Behavioral: Negative.    Objective:   Vitals:   12/25/20 1415  BP: 132/68  Pulse: 66  Temp: (!) 97.2 F (36.2 C)  TempSrc: Temporal  SpO2: 95%  Weight: 183 lb 6.4 oz (83.2 kg)  Height: _0  (1.753 m)     Physical Exam Constitutional:      General: He is not in acute distress.    Appearance: Normal appearance.  HENT:     Head: Normocephalic and atraumatic.  Eyes:     Extraocular Movements: Extraocular movements intact.     Conjunctiva/sclera: Conjunctivae  normal.     Pupils: Pupils are equal, round, and reactive to light.  Cardiovascular:     Rate and Rhythm: Normal rate and regular rhythm.     Pulses: Normal pulses.     Heart sounds: Normal heart sounds. No murmur heard.   Pulmonary:     Effort: Pulmonary effort is normal.     Breath sounds: Normal breath sounds. No wheezing, rhonchi or rales.  Abdominal:     General: Bowel sounds are normal.     Palpations: Abdomen is soft.  Musculoskeletal:     Right lower leg: No edema.     Left lower leg: No edema.  Lymphadenopathy:     Cervical: No cervical adenopathy.  Skin:    General: Skin is warm and dry.  Neurological:     General: No focal deficit present.     Mental Status: He is alert.  Psychiatric:        Mood and Affect: Mood normal.        Behavior: Behavior normal.        Thought Content: Thought content normal.        Judgment: Judgment normal.     CBC    Component Value Date/Time   WBC 8.6 11/26/2020 0847   RBC 2.35 (L) 11/26/2020 0847   HGB 8.6 (L) 01/13/2021 1336   HCT 23.9 (L) 11/26/2020 0847   PLT 161 11/26/2020 0847   MCV 101.7 (H) 11/26/2020 0847   MCH 32.3 11/26/2020 0847   MCHC 31.8 11/26/2020 0847   RDW 15.0 11/26/2020 0847   LYMPHSABS 1.3 11/26/2020 0847   MONOABS 0.6 11/26/2020 0847   EOSABS 0.0 11/26/2020 0847   BASOSABS 0.0 11/26/2020 0847   BMP Latest Ref Rng & Units 11/26/2020 09/30/2020 09/29/2020  Glucose 70 - 99 mg/dL 85 148(H) 141(H)  BUN 8 - 23 mg/dL 61(H) 71(H) 57(H)  Creatinine 0.61 - 1.24 mg/dL 4.26(H) 5.64(H) 5.42(H)  Sodium 135 - 145 mmol/L 142 140 140  Potassium 3.5 - 5.1 mmol/L 4.1 4.8 4.6  Chloride 98 - 111 mmol/L 104 109 108  CO2 22 - 32 mmol/L 24 20(L) 19(L)  Calcium 8.9 - 10.3 mg/dL 8.3(L) 9.1 9.3   Chest imaging: CT Chest WO Contrast 11/02/2020 Mediastinum/Nodes: Thoracic inlet is unremarkable. No sizable hilar or mediastinal adenopathy is noted. Esophagus as visualized is within normal limits.  Lungs/Pleura: Lungs are  well aerated bilaterally. Very minimal emphysematous changes are identified. No focal infiltrate or sizable effusion is seen. No pneumothorax or sizable parenchymal nodule is seen. Minimal lingular scarring is again identified and stable. 3 mm nodule is noted within the left upper lobe best seen on image number 68 of series 8. An adjacent calcified granuloma is noted as well. 4 mm left apical nodule is noted best seen on image number 28 of series 8.  PFT: PFT Results Latest Ref Rng & Units 01/08/2021  FVC-Pre L 2.61  FVC-Predicted Pre % 60  FVC-Post L 2.86  FVC-Predicted Post % 65  Pre FEV1/FVC % % 77  Post FEV1/FCV % % 77  FEV1-Pre L 2.01  FEV1-Predicted Pre % 62  FEV1-Post L 2.21  DLCO uncorrected ml/min/mmHg 12.36  DLCO UNC% % 48  DLCO corrected ml/min/mmHg 17.04  DLCO COR %Predicted % 66  DLVA Predicted % 94  TLC L 5.15  TLC % Predicted % 75  RV % Predicted % 106    Labs:  Path:  Echo:  Heart Catheterization:  Assessment & Plan:   ANCA-associated vasculitis ([place]) - Plan: Pulmonary Function Test  Pulmonary emphysema, unspecified emphysema type (HCC)  Shortness of breath - Plan: albuterol (VENTOLIN HFA) 108 (90 Base) MCG/ACT inhaler, Pulmonary Function Test  Pulmonary nodule  Discussion: Tavis Kring is a 70 year old male, former smoker with 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   He has mild emphysematous changes based on his CT Chest scan in 10/2020. There are no overt signs of diffuse lung involvement on the CT chest in regards to vasculitis. His pulmonary function tests do show a restrictive pattern along with a  diffusion defect consistent with vasculitis. We will obtain a high resolution CT Chest for further evaluation.   Patient is currently setup for Cytoxan and steroid therapy through his nephrology team. He should continue this treatment  and I stressed the importance of keeping his infusion appointments. He will likely require transition to retuximab for maintenance therapy in the future.   Follow up in 1 month.  Freda Jackson, MD [facility] Office: [phone]   See Amion for Pager Details    Current Outpatient Medications:  .  albuterol (VENTOLIN HFA) 108 (90 Base) MCG/ACT inhaler, Inhale 2 puffs into the lungs every 6 (six) hours as needed for wheezing or shortness of breath., Disp: 8 g, Rfl: 6 .  atenolol (TENORMIN) 25 MG tablet, Take by mouth., Disp: , Rfl:  .  Blood Glucose Monitoring Suppl (ACCU-CHEK AVIVA PLUS) w/Device KIT, Use to check your blood sugar, Disp: , Rfl:  .  Cholecalciferol (VITAMIN D) 50 MCG (2000 UT) CAPS, 1 capsule, Disp: , Rfl:  .  glucose blood (ONETOUCH VERIO) test strip, use to check your blood sugar, Disp: , Rfl:  .  insulin aspart (NOVOLOG) 100 UNIT/ML injection, Inject 5 Units into the skin 3 (three) times daily before meals., Disp: , Rfl:  .  LEVEMIR FLEXTOUCH 100 UNIT/ML FlexPen, Inject into the skin., Disp: , Rfl:  .  LOKELMA 10 g PACK packet, Take 1 packet by mouth daily., Disp: , Rfl:  .  vitamin B-12 (CYANOCOBALAMIN) 1000 MCG tablet, Take 1,000 mcg by mouth daily., Disp: , Rfl:

## 2020-12-25 NOTE — Patient Instructions (Signed)
Start albuterol inhaler 1-2 puffs every 4-6 hours as needed for shortness of breath  We will schedule you for pulmonary function tests at your convenience.

## 2020-12-28 DIAGNOSIS — Z20822 Contact with and (suspected) exposure to covid-19: Secondary | ICD-10-CM | POA: Diagnosis not present

## 2020-12-28 DIAGNOSIS — N189 Chronic kidney disease, unspecified: Secondary | ICD-10-CM | POA: Diagnosis not present

## 2020-12-28 DIAGNOSIS — N179 Acute kidney failure, unspecified: Secondary | ICD-10-CM | POA: Diagnosis not present

## 2020-12-30 ENCOUNTER — Other Ambulatory Visit: Payer: Self-pay

## 2020-12-30 ENCOUNTER — Encounter (HOSPITAL_COMMUNITY)
Admission: RE | Admit: 2020-12-30 | Discharge: 2020-12-30 | Disposition: A | Discharge status: Home / Self Care | Payer: PPO | Source: Ambulatory Visit | Attending: Nephrology | Admitting: Nephrology

## 2020-12-30 VITALS — BP 137/76 | HR 71 | Temp 97.6°F | Resp 18

## 2020-12-30 DIAGNOSIS — N179 Acute kidney failure, unspecified: Secondary | ICD-10-CM

## 2020-12-30 LAB — IRON AND TIBC
Iron: 76 ug/dL (ref 45–182)
Saturation Ratios: 25 % (ref 17.9–39.5)
TIBC: 298 ug/dL (ref 250–450)
UIBC: 222 ug/dL

## 2020-12-30 LAB — FERRITIN: Ferritin: 1204 ng/mL — ABNORMAL HIGH (ref 24–336)

## 2020-12-30 LAB — POCT HEMOGLOBIN-HEMACUE: Hemoglobin: 8.6 g/dL — ABNORMAL LOW (ref 13.0–17.0)

## 2020-12-30 MED ORDER — EPOETIN ALFA-EPBX 3000 UNIT/ML IJ SOLN
INTRAMUSCULAR | Status: AC
  Filled 2020-12-30: qty 1

## 2020-12-30 MED ORDER — EPOETIN ALFA-EPBX 10000 UNIT/ML IJ SOLN
INTRAMUSCULAR | Status: AC
  Filled 2020-12-30: qty 1

## 2020-12-30 MED ORDER — EPOETIN ALFA-EPBX 2000 UNIT/ML IJ SOLN
INTRAMUSCULAR | Status: AC
  Filled 2020-12-30: qty 1

## 2020-12-30 MED ORDER — EPOETIN ALFA-EPBX 10000 UNIT/ML IJ SOLN
15000.0000 [IU] | INTRAMUSCULAR | Status: DC
  Administered 2020-12-30: 15000 [IU] via SUBCUTANEOUS

## 2021-01-04 ENCOUNTER — Encounter: Payer: Self-pay | Admitting: Dietician

## 2021-01-04 ENCOUNTER — Encounter: Payer: PPO | Attending: Internal Medicine | Admitting: Dietician

## 2021-01-04 ENCOUNTER — Other Ambulatory Visit: Payer: Self-pay

## 2021-01-04 DIAGNOSIS — E119 Type 2 diabetes mellitus without complications: Secondary | ICD-10-CM | POA: Insufficient documentation

## 2021-01-04 NOTE — Patient Instructions (Signed)
Continue a low sodium, low potassium diet. You can have 2-3 servings of low potassium fruit daily if you desire. Avoid all salt substitutes and light salt that contain potassium chloride. Continue to read labels.   Avoid all foods that contain Phos... in the ingredient list.  Limit meat portions. See handout.  Resources: [employer]  Make an eye appointment Continue to take your medication as prescribed. Rotate your insulin injection sites.

## 2021-01-04 NOTE — Progress Notes (Signed)
Diabetes Self-Management Education  Visit Type: First/Initial  Appt. Start Time: 1400 Appt. End Time: 9242  01/04/2021  [name], identified by name and date of birth, is a 70 y.o. male with a diagnosis of Diabetes: Type 2.   ASSESSMENT Patient is here today with his wife.  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  He had been on prednisone but this has now been stopped. Labs noted to include A1C 10.2% 11/20/2020 eGFR 14, BUN 83, Creatine 4.3, Potassium 5.2 10/05/2020. Medications include Levemir 10 units at noon daily.  Novolog has been stopped.  Patient lives with his wife.  She does the cooking and shopping. Patient has been following a carbohydrate modified, low sodium, low potassium diet. Patient is retired from [employer].  Height _0  (1.753 m), weight 182 lb (82.6 kg). Body mass index is 26.88 kg/m.   Diabetes Self-Management Education - 01/04/21 1419      Visit Information   Visit Type First/Initial      Initial Visit   Diabetes Type Type 2    Are you currently following a meal plan? No    Are you taking your medications as prescribed? Yes    Date Diagnosed 11/20/2020      Health Coping   How would you rate your overall health? Poor      Psychosocial Assessment   Patient Belief/Attitude about Diabetes Motivated to manage diabetes    Self-care barriers None    Self-management support Doctor's office    Other persons present Patient;Spouse/SO    Patient Concerns Nutrition/Meal planning;Glycemic Control    Special Needs None    Preferred Learning Style No preference indicated    Learning Readiness Ready    How often do you need to have someone help you when you read instructions, pamphlets, or other written materials from your doctor or pharmacy? 1 - Never    What is the last grade level you completed in school? 12      Pre-Education Assessment   Patient  understands the diabetes disease and treatment process. Needs Instruction    Patient understands incorporating nutritional management into lifestyle. Needs Instruction    Patient undertands incorporating physical activity into lifestyle. Needs Instruction    Patient understands using medications safely. Needs Instruction    Patient understands monitoring blood glucose, interpreting and using results Needs Instruction    Patient understands prevention, detection, and treatment of acute complications. Needs Instruction    Patient understands prevention, detection, and treatment of chronic complications. Needs Instruction    Patient understands how to develop strategies to address psychosocial issues. Needs Instruction    Patient understands how to develop strategies to promote health/change behavior. Needs Instruction      Complications   Last HgB A1C per patient/outside source 10.2 %   11/20/2020   How often do you check your blood sugar? 3-4 times/day    Fasting Blood glucose range (mg/dL) 70-129    Postprandial Blood glucose range (mg/dL) 70-129    Number of hypoglycemic episodes per month 2    Can you tell when your blood sugar is low? Yes    What do you do if your blood sugar is low? OJ    Number of hyperglycemic episodes per week 0    Have you had a dilated eye exam in the past 12 months? No    Have you had a dental exam in the past 12 months? No  Are you checking your feet? Yes    How many days per week are you checking your feet? 7      Dietary Intake   Breakfast instant oatmeal, 2 toast, butter OR eggs [employer] toast with butter  (2), OJ    Snack (morning) none    Lunch sandwich OR soup OR leftovers    Snack (afternoon) none    Dinner chicken or pork or lean beaf, pasta, pintos or corn or green beans    Snack (evening) none    Beverage(s) water      Exercise   Exercise Type ADL's      Patient Education   Previous Diabetes Education No    Disease state  Definition of diabetes,  type 1 and 2, and the diagnosis of diabetes;Factors that contribute to the development of diabetes;Explored patient's options for treatment of their diabetes    Nutrition management  Food label reading, portion sizes and measuring food.;Role of diet in the treatment of diabetes and the relationship between the three main macronutrients and blood glucose level;Meal options for control of blood glucose level and chronic complications.;Other (comment)   NKD diet without dialysis   Physical activity and exercise  Identified with patient nutritional and/or medication changes necessary with exercise.;Role of exercise on diabetes management, blood pressure control and cardiac health.    Medications Reviewed patients medication for diabetes, action, purpose, timing of dose and side effects.    Monitoring Identified appropriate SMBG and/or A1C goals.;Taught/discussed recording of test results and interpretation of SMBG.;Yearly dilated eye exam    Acute complications Taught treatment of hypoglycemia - the 15 rule.    Chronic complications Relationship between chronic complications and blood glucose control      Individualized Goals (developed by patient)   Nutrition Other (comment)   NKD diet   Physical Activity --   as tolerated   Medications take my medication as prescribed    Monitoring  test my blood glucose as discussed    Reducing Risk increase portions of healthy fats;examine blood glucose patterns;do foot checks daily    Health Coping discuss diabetes with (comment)   MD, RD, CDCES     Post-Education Assessment   Patient understands the diabetes disease and treatment process. Demonstrates understanding / competency    Patient understands incorporating nutritional management into lifestyle. Needs Review    Patient undertands incorporating physical activity into lifestyle. Needs Review    Patient understands using medications safely. Demonstrates understanding / competency    Patient understands  monitoring blood glucose, interpreting and using results Demonstrates understanding / competency    Patient understands prevention, detection, and treatment of acute complications. Demonstrates understanding / competency    Patient understands prevention, detection, and treatment of chronic complications. Demonstrates understanding / competency    Patient understands how to develop strategies to address psychosocial issues. Demonstrates understanding / competency    Patient understands how to develop strategies to promote health/change behavior. Needs Review      Outcomes   Expected Outcomes Demonstrated interest in learning. Expect positive outcomes    Future DMSE 2 months    Program Status Not Completed           Individualized Plan for Diabetes Self-Management Training:   Learning Objective:  Patient will have a greater understanding of diabetes self-management. Patient education plan is to attend individual and/or group sessions per assessed needs and concerns.   Plan:   Patient Instructions  Continue a low sodium, low potassium diet. You can have  2-3 servings of low potassium fruit daily if you desire. Avoid all salt substitutes and light salt that contain potassium chloride. Continue to read labels.   Avoid all foods that contain Phos... in the ingredient list.  Limit meat portions. See handout.  Resources: [facility]  Make an eye appointment Continue to take your medication as prescribed. Rotate your insulin injection sites.   Expected Outcomes:  Demonstrated interest in learning. Expect positive outcomes  Education material provided: ADA - How to Thrive: A Guide for Your Journey with Diabetes and My Plate, NKD National Kidney diet for those without dialysis  If problems or questions, patient to contact team via:  Phone  Future DSME appointment: 2 months

## 2021-01-05 DIAGNOSIS — Z20822 Contact with and (suspected) exposure to covid-19: Secondary | ICD-10-CM | POA: Diagnosis not present

## 2021-01-07 DIAGNOSIS — R3121 Asymptomatic microscopic hematuria: Secondary | ICD-10-CM | POA: Diagnosis not present

## 2021-01-07 DIAGNOSIS — N200 Calculus of kidney: Secondary | ICD-10-CM | POA: Diagnosis not present

## 2021-01-08 ENCOUNTER — Ambulatory Visit (INDEPENDENT_AMBULATORY_CARE_PROVIDER_SITE_OTHER): Payer: PPO | Admitting: Pulmonary Disease

## 2021-01-08 ENCOUNTER — Other Ambulatory Visit: Payer: Self-pay

## 2021-01-08 DIAGNOSIS — R0602 Shortness of breath: Secondary | ICD-10-CM

## 2021-01-08 DIAGNOSIS — I776 Arteritis, unspecified: Secondary | ICD-10-CM

## 2021-01-08 DIAGNOSIS — I7782 Antineutrophilic cytoplasmic antibody (ANCA) vasculitis: Secondary | ICD-10-CM

## 2021-01-08 LAB — PULMONARY FUNCTION TEST
DL/VA % pred: 94 %
DL/VA: 3.89 ml/min/mmHg/L
DLCO cor % pred: 66 %
DLCO cor: 17.04 ml/min/mmHg
DLCO unc % pred: 48 %
DLCO unc: 12.36 ml/min/mmHg
FEF 25-75 Post: 2.32 L/s
FEF 25-75 Pre: 1.8 L/s
FEF2575-%Change-Post: 29 %
FEF2575-%Pred-Post: 93 %
FEF2575-%Pred-Pre: 72 %
FEV1-%Change-Post: 10 %
FEV1-%Pred-Post: 69 %
FEV1-%Pred-Pre: 62 %
FEV1-Post: 2.21 L
FEV1-Pre: 2.01 L
FEV1FVC-%Change-Post: 0 %
FEV1FVC-%Pred-Pre: 103 %
FEV6-%Change-Post: 7 %
FEV6-%Pred-Post: 68 %
FEV6-%Pred-Pre: 63 %
FEV6-Post: 2.8 L
FEV6-Pre: 2.61 L
FEV6FVC-%Change-Post: -2 %
FEV6FVC-%Pred-Post: 103 %
FEV6FVC-%Pred-Pre: 105 %
FVC-%Change-Post: 9 %
FVC-%Pred-Post: 65 %
FVC-%Pred-Pre: 60 %
FVC-Post: 2.86 L
FVC-Pre: 2.61 L
Post FEV1/FVC ratio: 77 %
Post FEV6/FVC ratio: 98 %
Pre FEV1/FVC ratio: 77 %
Pre FEV6/FVC Ratio: 100 %
RV % pred: 106 %
RV: 2.51 L
TLC % pred: 75 %
TLC: 5.15 L

## 2021-01-08 NOTE — Progress Notes (Signed)
PFT done today. 

## 2021-01-11 DIAGNOSIS — N179 Acute kidney failure, unspecified: Secondary | ICD-10-CM | POA: Diagnosis not present

## 2021-01-13 ENCOUNTER — Ambulatory Visit (HOSPITAL_COMMUNITY)
Admission: RE | Admit: 2021-01-13 | Discharge: 2021-01-13 | Disposition: A | Discharge status: Home / Self Care | Payer: PPO | Source: Ambulatory Visit | Attending: Nephrology | Admitting: Nephrology

## 2021-01-13 ENCOUNTER — Other Ambulatory Visit: Payer: Self-pay

## 2021-01-13 VITALS — BP 129/69 | HR 64 | Temp 97.3°F | Resp 18

## 2021-01-13 DIAGNOSIS — N179 Acute kidney failure, unspecified: Secondary | ICD-10-CM | POA: Diagnosis not present

## 2021-01-13 LAB — POCT HEMOGLOBIN-HEMACUE: Hemoglobin: 8.6 g/dL — ABNORMAL LOW (ref 13.0–17.0)

## 2021-01-13 MED ORDER — EPOETIN ALFA-EPBX 10000 UNIT/ML IJ SOLN
INTRAMUSCULAR | Status: AC
  Administered 2021-01-13: 10000 [IU] via SUBCUTANEOUS
  Filled 2021-01-13: qty 1

## 2021-01-13 MED ORDER — EPOETIN ALFA-EPBX 10000 UNIT/ML IJ SOLN: 15000.0000 [IU] | INTRAMUSCULAR | Status: DC

## 2021-01-13 MED ORDER — EPOETIN ALFA-EPBX 3000 UNIT/ML IJ SOLN
INTRAMUSCULAR | Status: AC
  Administered 2021-01-13: 3000 [IU] via SUBCUTANEOUS
  Filled 2021-01-13: qty 1

## 2021-01-13 MED ORDER — EPOETIN ALFA-EPBX 2000 UNIT/ML IJ SOLN
INTRAMUSCULAR | Status: AC
  Administered 2021-01-13: 2000 [IU] via SUBCUTANEOUS
  Filled 2021-01-13: qty 1

## 2021-01-15 ENCOUNTER — Encounter: Payer: Self-pay | Admitting: Pulmonary Disease

## 2021-01-16 ENCOUNTER — Other Ambulatory Visit: Payer: Self-pay | Admitting: Pulmonary Disease

## 2021-01-16 DIAGNOSIS — R0602 Shortness of breath: Secondary | ICD-10-CM

## 2021-01-18 ENCOUNTER — Telehealth: Payer: Self-pay | Admitting: Pulmonary Disease

## 2021-01-18 DIAGNOSIS — N179 Acute kidney failure, unspecified: Secondary | ICD-10-CM | POA: Diagnosis not present

## 2021-01-18 NOTE — Telephone Encounter (Signed)
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   He has mild emphysematous changes based on his CT Chest scan in 10/2020. There are no overt signs of diffuse lung involvement on the CT chest in regards to vasculitis. His pulmonary function tests do show a restrictive pattern along with a diffusion defect consistent with vasculitis. We will obtain a high resolution CT Chest for further evaluation.    Called and spoke with pt letting him know the reason why Dr. Erin Fulling wanted to have a HRCT done and he verbalized understanding. Pt is scheduled for OV with Dr. Erin Fulling 3/17 so if able, we should get the CT done prior to that OV.  Routing encounter back to [place].

## 2021-01-19 NOTE — Telephone Encounter (Signed)
Pt aware of appt moved it to 01/27/21

## 2021-01-21 DIAGNOSIS — I714 Abdominal aortic aneurysm, without rupture: Secondary | ICD-10-CM | POA: Diagnosis not present

## 2021-01-21 DIAGNOSIS — I120 Hypertensive chronic kidney disease with stage 5 chronic kidney disease or end stage renal disease: Secondary | ICD-10-CM | POA: Diagnosis not present

## 2021-01-21 DIAGNOSIS — D631 Anemia in chronic kidney disease: Secondary | ICD-10-CM | POA: Diagnosis not present

## 2021-01-21 DIAGNOSIS — E872 Acidosis: Secondary | ICD-10-CM | POA: Diagnosis not present

## 2021-01-21 DIAGNOSIS — N057 Unspecified nephritic syndrome with diffuse crescentic glomerulonephritis: Secondary | ICD-10-CM | POA: Diagnosis not present

## 2021-01-21 DIAGNOSIS — N185 Chronic kidney disease, stage 5: Secondary | ICD-10-CM | POA: Diagnosis not present

## 2021-01-21 DIAGNOSIS — E875 Hyperkalemia: Secondary | ICD-10-CM | POA: Diagnosis not present

## 2021-01-21 DIAGNOSIS — N058 Unspecified nephritic syndrome with other morphologic changes: Secondary | ICD-10-CM | POA: Diagnosis not present

## 2021-01-21 DIAGNOSIS — N2581 Secondary hyperparathyroidism of renal origin: Secondary | ICD-10-CM | POA: Diagnosis not present

## 2021-01-27 ENCOUNTER — Encounter (HOSPITAL_COMMUNITY)
Admission: RE | Admit: 2021-01-27 | Discharge: 2021-01-27 | Disposition: A | Discharge status: Home / Self Care | Payer: PPO | Source: Ambulatory Visit | Attending: Nephrology | Admitting: Nephrology

## 2021-01-27 ENCOUNTER — Ambulatory Visit (INDEPENDENT_AMBULATORY_CARE_PROVIDER_SITE_OTHER)
Admission: RE | Admit: 2021-01-27 | Discharge: 2021-01-27 | Disposition: A | Discharge status: Home / Self Care | Payer: PPO | Source: Ambulatory Visit | Attending: Pulmonary Disease | Admitting: Pulmonary Disease

## 2021-01-27 ENCOUNTER — Other Ambulatory Visit: Payer: Self-pay

## 2021-01-27 VITALS — BP 141/71 | HR 54 | Temp 97.4°F | Resp 18

## 2021-01-27 DIAGNOSIS — J849 Interstitial pulmonary disease, unspecified: Secondary | ICD-10-CM | POA: Diagnosis not present

## 2021-01-27 DIAGNOSIS — R0602 Shortness of breath: Secondary | ICD-10-CM

## 2021-01-27 DIAGNOSIS — I7782 Antineutrophilic cytoplasmic antibody (ANCA) vasculitis: Secondary | ICD-10-CM

## 2021-01-27 DIAGNOSIS — I776 Arteritis, unspecified: Secondary | ICD-10-CM

## 2021-01-27 DIAGNOSIS — N179 Acute kidney failure, unspecified: Secondary | ICD-10-CM | POA: Insufficient documentation

## 2021-01-27 DIAGNOSIS — I700 Atherosclerosis of aorta: Secondary | ICD-10-CM | POA: Diagnosis not present

## 2021-01-27 DIAGNOSIS — J929 Pleural plaque without asbestos: Secondary | ICD-10-CM | POA: Diagnosis not present

## 2021-01-27 DIAGNOSIS — J432 Centrilobular emphysema: Secondary | ICD-10-CM | POA: Diagnosis not present

## 2021-01-27 DIAGNOSIS — D631 Anemia in chronic kidney disease: Secondary | ICD-10-CM | POA: Diagnosis not present

## 2021-01-27 DIAGNOSIS — I2510 Atherosclerotic heart disease of native coronary artery without angina pectoris: Secondary | ICD-10-CM | POA: Diagnosis not present

## 2021-01-27 LAB — IRON AND TIBC
Iron: 71 ug/dL (ref 45–182)
Saturation Ratios: 22 % (ref 17.9–39.5)
TIBC: 318 ug/dL (ref 250–450)
UIBC: 247 ug/dL

## 2021-01-27 LAB — POCT HEMOGLOBIN-HEMACUE: Hemoglobin: 9 g/dL — ABNORMAL LOW (ref 13.0–17.0)

## 2021-01-27 LAB — FERRITIN: Ferritin: 871 ng/mL — ABNORMAL HIGH (ref 24–336)

## 2021-01-27 MED ORDER — EPOETIN ALFA-EPBX 40000 UNIT/ML IJ SOLN
30000.0000 [IU] | INTRAMUSCULAR | Status: DC
  Administered 2021-01-27: 30000 [IU] via SUBCUTANEOUS

## 2021-01-27 MED ORDER — EPOETIN ALFA-EPBX 40000 UNIT/ML IJ SOLN
INTRAMUSCULAR | Status: AC
  Filled 2021-01-27: qty 1

## 2021-01-28 ENCOUNTER — Encounter: Payer: Self-pay | Admitting: Pulmonary Disease

## 2021-01-28 ENCOUNTER — Ambulatory Visit: Payer: PPO | Admitting: Pulmonary Disease

## 2021-01-28 VITALS — BP 124/72 | HR 61 | Temp 97.7°F | Ht 69.0 in | Wt 181.4 lb

## 2021-01-28 DIAGNOSIS — J439 Emphysema, unspecified: Secondary | ICD-10-CM

## 2021-01-28 DIAGNOSIS — I776 Arteritis, unspecified: Secondary | ICD-10-CM | POA: Diagnosis not present

## 2021-01-28 DIAGNOSIS — I7782 Antineutrophilic cytoplasmic antibody (ANCA) vasculitis: Secondary | ICD-10-CM

## 2021-01-28 DIAGNOSIS — R911 Solitary pulmonary nodule: Secondary | ICD-10-CM | POA: Diagnosis not present

## 2021-01-28 MED ORDER — SPIRIVA RESPIMAT 2.5 MCG/ACT IN AERS: 2.0000 | INHALATION_SPRAY | Freq: Every day | RESPIRATORY_TRACT | 0 refills | Status: DC

## 2021-01-28 MED ORDER — SPIRIVA RESPIMAT 2.5 MCG/ACT IN AERS: 2.0000 | INHALATION_SPRAY | Freq: Every day | RESPIRATORY_TRACT | 6 refills | Status: DC

## 2021-01-28 NOTE — Progress Notes (Signed)
Synopsis: Referred in January 2022 by Rosita Fire, MD for shortness of breath  Subjective:   PATIENT ID: Taylor Moment GENDER: male DOB: 14-Jul-1951, MRN: [id number]  Has more fatigue than shortness of breath.   Kidney function improving, but hemoglobin stayin in the 7-8 range. Recently in the 9.    HPI  Chief Complaint  Patient presents with  . Follow-up    4 wk f/u after PFT and CT. States his breathing has been stable since last visit.    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   He has been doing better since last visit. He reports he has more fatigue than dyspnea. He has been receiving cyclophosphamide infusions and reports his kidney function has improved since initiation of treatment.   PFTs on 01/08/21 show a mild restrictive defect and mild diffusion defect. A HRCT Chest was then obtained on 01/27/21 that shows a saber sheath trachea with mild diffuse bronchial wall thickening with mild centrilobular and paraseptal emphysema. There were no signs of interstitial lung disease.  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 where he was started on solumedrol $RemoveBefor'500mg'LqTaUiyhrGwP$  daily for 3 doses and was given cytoxan 7.$RemoveBeforeD'5mg'hkqnHXJxDrjNps$ /kg on 09/29/2020. He was discharged on 09/30/20 on $RemoveBef'60mg'vLytDRovrJ$  of prednisone daily. He was tapered down to $Remov'20mg'NaVpVI$  of prednisone by 11/16/20 with his Nephrologist. He reports not being able to get his second dose of cytoxan due to issues with the infusion scheduling. As of today he still has not received his second dose of cytoxan.   In regards to his shortness of breath, he did have some dyspnea on exertion prior to his admission but over the past month his dyspnea has progressed.   He denies cough or  hemoptysis. He denies wheezing or chest tightness.   CT Chest 11/02/20 shows mild emphysematous changes. There is scarring of the lingula. There is a 60mm nodule in the left upper lobe and a 76mm nodule in the left apex.   Smoked at age 38 to age to early 32s. 10 or less.   Worked 3rd shift, [employer]. [employer]. Moderate dust exposure from paper.    Past Medical History:  Diagnosis Date  . Arthritis   . Chronic back pain   . Chronic kidney disease   . Diabetes mellitus without complication ([place])   . Diverticulitis   . Dyspnea    with exertion  . GERD (gastroesophageal reflux disease)    occ  . History of kidney stones   . Peripheral vascular disease ([place])    aaa 39 mm 02-14-17 epic [place]     Family History  Problem Relation Age of Onset  . Emphysema Father      Social History   Socioeconomic History  . Marital status: Married    Spouse name: Not on file  . Number of children: Not on file  . Years of education: Not on file  . Highest education level: Not on file  Occupational History  . Not on file  Tobacco Use  . Smoking status: Former Smoker    Packs/day: 0.25    Years: 30.00    Pack years: 7.50    Types: Cigarettes    Quit date: 12/25/2013    Years since quitting: 7.1  .  Smokeless tobacco: Never Used  Vaping Use  . Vaping Use: Never used  Substance and Sexual Activity  . Alcohol use: No  . Drug use: No  . Sexual activity: Not on file  Other Topics Concern  . Not on file  Social History Narrative  . Not on file   Social Determinants of Health   Financial Resource Strain: Not on file  Food Insecurity: Not on file  Transportation Needs: Not on file  Physical Activity: Not on file  Stress: Not on file  Social Connections: Not on file  Intimate Partner Violence: Not on file     Allergies  Allergen Reactions  . Other Other (See Comments)    Anesthesia medication (pt unsure of which med)- reaction upset stomach. (after colonoscopy)      Outpatient Medications Prior to Visit  Medication Sig Dispense Refill  . albuterol (VENTOLIN HFA) 108 (90 Base) MCG/ACT inhaler Inhale 2 puffs into the lungs every 6 (six) hours as needed for wheezing or shortness of breath. 8 King 6  . atenolol (TENORMIN) 25 MG tablet Take by mouth.    . Blood Glucose Monitoring Suppl (ACCU-CHEK AVIVA PLUS) w/Device KIT Use to check your blood sugar    . Cholecalciferol (VITAMIN D) 50 MCG (2000 UT) CAPS 1 capsule    . glucose blood (ONETOUCH VERIO) test strip use to check your blood sugar    . LEVEMIR FLEXTOUCH 100 UNIT/ML FlexPen Inject into the skin.    Marland Kitchen LOKELMA 10 King PACK packet Take 1 packet by mouth daily.    . vitamin B-12 (CYANOCOBALAMIN) 1000 MCG tablet Take 1,000 mcg by mouth daily.    . insulin aspart (NOVOLOG) 100 UNIT/ML injection Inject 5 Units into the skin 3 (three) times daily before meals.     No facility-administered medications prior to visit.    Review of Systems  Constitutional: Positive for malaise/fatigue. Negative for chills, fever and weight loss.  HENT: Negative for congestion, sinus pain and sore throat.   Eyes: Negative.   Respiratory: Positive for shortness of breath. Negative for cough, hemoptysis, sputum production and wheezing.   Cardiovascular: Negative for chest pain, palpitations, orthopnea, claudication and leg swelling.  Gastrointestinal: Negative for abdominal pain, heartburn, nausea and vomiting.  Genitourinary: Negative.   Musculoskeletal: Negative for joint pain and myalgias.  Skin: Negative for rash.  Neurological: Positive for dizziness. Negative for weakness.  Endo/Heme/Allergies: Negative.   Psychiatric/Behavioral: Negative.    Objective:   Vitals:   01/28/21 1514  BP: 124/72  Pulse: 61  Temp: 97.7 F (36.5 C)  TempSrc: Temporal  SpO2: 97%  Weight: 181 lb 6.4 oz (82.3 kg)  Height: $Remove'5\' 9"'olrltXW$  (1.753 m)     Physical Exam Constitutional:      General: He is not in acute distress.    Appearance:  Normal appearance.  HENT:     Head: Normocephalic and atraumatic.  Eyes:     Extraocular Movements: Extraocular movements intact.     Conjunctiva/sclera: Conjunctivae normal.     Pupils: Pupils are equal, round, and reactive to light.  Cardiovascular:     Rate and Rhythm: Normal rate and regular rhythm.     Pulses: Normal pulses.     Heart sounds: Normal heart sounds. No murmur heard.   Pulmonary:     Effort: Pulmonary effort is normal.     Breath sounds: Normal breath sounds. No wheezing, rhonchi or rales.  Abdominal:     General: Bowel sounds are normal.  Palpations: Abdomen is soft.  Musculoskeletal:     Right lower leg: No edema.     Left lower leg: No edema.  Lymphadenopathy:     Cervical: No cervical adenopathy.  Skin:    General: Skin is warm and dry.  Neurological:     General: No focal deficit present.     Mental Status: He is alert.  Psychiatric:        Mood and Affect: Mood normal.        Behavior: Behavior normal.        Thought Content: Thought content normal.        Judgment: Judgment normal.     CBC    Component Value Date/Time   WBC 8.6 11/26/2020 0847   RBC 2.35 (L) 11/26/2020 0847   HGB 9.0 (L) 01/27/2021 1352   HCT 23.9 (L) 11/26/2020 0847   PLT 161 11/26/2020 0847   MCV 101.7 (H) 11/26/2020 0847   MCH 32.3 11/26/2020 0847   MCHC 31.8 11/26/2020 0847   RDW 15.0 11/26/2020 0847   LYMPHSABS 1.3 11/26/2020 0847   MONOABS 0.6 11/26/2020 0847   EOSABS 0.0 11/26/2020 0847   BASOSABS 0.0 11/26/2020 0847   BMP Latest Ref Rng & Units 11/26/2020 09/30/2020 09/29/2020  Glucose 70 - 99 mg/dL 85 612(L) 483(W)  BUN 8 - 23 mg/dL 34(K) 88(T) 37(B)  Creatinine 0.61 - 1.24 mg/dL 0.81(Q) 8.38(Z) 0.65(M)  Sodium 135 - 145 mmol/L 142 140 140  Potassium 3.5 - 5.1 mmol/L 4.1 4.8 4.6  Chloride 98 - 111 mmol/L 104 109 108  CO2 22 - 32 mmol/L 24 20(L) 19(L)  Calcium 8.9 - 10.3 mg/dL 8.3(L) 9.1 9.3   Chest imaging: HRCT Chest 01/27/21 1. No findings to  suggest interstitial lung disease. 2. Saber sheath trachea with mild diffuse bronchial wall thickening with very mild centrilobular and paraseptal emphysema; imaging findings suggestive of underlying COPD. 3. Aortic atherosclerosis, in addition to left main and 3 vessel coronary artery disease. Please note that although the presence of coronary artery calcium documents the presence of coronary artery disease, the severity of this disease and any potential stenosis cannot be assessed on this non-gated CT examination. Assessment for potential risk factor modification, dietary therapy or pharmacologic therapy may be warranted, if clinically indicated.  CT Chest WO Contrast 11/02/2020 Mediastinum/Nodes: Thoracic inlet is unremarkable. No sizable hilar or mediastinal adenopathy is noted. Esophagus as visualized is within normal limits.  Lungs/Pleura: Lungs are well aerated bilaterally. Very minimal emphysematous changes are identified. No focal infiltrate or sizable effusion is seen. No pneumothorax or sizable parenchymal nodule is seen. Minimal lingular scarring is again identified and stable. 3 mm nodule is noted within the left upper lobe best seen on image number 68 of series 8. An adjacent calcified granuloma is noted as well. 4 mm left apical nodule is noted best seen on image number 28 of series 8.  PFT: PFT Results Latest Ref Rng & Units 01/08/2021  FVC-Pre L 2.61  FVC-Predicted Pre % 60  FVC-Post L 2.86  FVC-Predicted Post % 65  Pre FEV1/FVC % % 77  Post FEV1/FCV % % 77  FEV1-Pre L 2.01  FEV1-Predicted Pre % 62  FEV1-Post L 2.21  DLCO uncorrected ml/min/mmHg 12.36  DLCO UNC% % 48  DLCO corrected ml/min/mmHg 17.04  DLCO COR %Predicted % 66  DLVA Predicted % 94  TLC L 5.15  TLC % Predicted % 75  RV % Predicted % 106    Labs:  Path:  Echo:  Heart Catheterization:  Assessment & Plan:   ANCA-associated vasculitis ([place])  Pulmonary emphysema, unspecified  emphysema type ([place]) - Plan: Tiotropium Bromide Monohydrate (SPIRIVA RESPIMAT) 2.5 MCG/ACT AERS  Pulmonary nodule  Discussion: Taylor King is a 70 year old male, former smoker with 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    He does not appear to have pulmonary involvement of vasculitis based on his recent HRCT chest. His PFTs and HRCT chest are consistent with COPD/Emphysema.   We will start him on spiriva inhaler therapy and monitor for benefit in his breathing. We can consider additional inhaled therapies in the future if he does not notice improvement.   Follow up in 6 months.  Freda Jackson, MD [facility] Office: [phone]    Current Outpatient Medications:  .  albuterol (VENTOLIN HFA) 108 (90 Base) MCG/ACT inhaler, Inhale 2 puffs into the lungs every 6 (six) hours as needed for wheezing or shortness of breath., Disp: 8 King, Rfl: 6 .  atenolol (TENORMIN) 25 MG tablet, Take by mouth., Disp: , Rfl:  .  Blood Glucose Monitoring Suppl (ACCU-CHEK AVIVA PLUS) w/Device KIT, Use to check your blood sugar, Disp: , Rfl:  .  Cholecalciferol (VITAMIN D) 50 MCG (2000 UT) CAPS, 1 capsule, Disp: , Rfl:  .  glucose blood (ONETOUCH VERIO) test strip, use to check your blood sugar, Disp: , Rfl:  .  LEVEMIR FLEXTOUCH 100 UNIT/ML FlexPen, Inject into the skin., Disp: , Rfl:  .  LOKELMA 10 King PACK packet, Take 1 packet by mouth daily., Disp: , Rfl:  .  Tiotropium Bromide Monohydrate (SPIRIVA RESPIMAT) 2.5 MCG/ACT AERS, Inhale 2 puffs into the lungs daily., Disp: 4 King, Rfl: 6 .  Tiotropium Bromide Monohydrate (SPIRIVA RESPIMAT) 2.5 MCG/ACT AERS, Inhale 2 puffs into the lungs daily., Disp: 4 King, Rfl: 0 .  vitamin B-12 (CYANOCOBALAMIN) 1000 MCG tablet, Take 1,000 mcg by mouth daily., Disp: , Rfl:

## 2021-01-28 NOTE — Progress Notes (Signed)
Patient seen in the office today and instructed on use of Spiriva 2.5mcg.  Patient expressed understanding and demonstrated technique.  Damarrion Mimbs CMA 01/28/2021 

## 2021-01-28 NOTE — Patient Instructions (Signed)
Start spiriva 2.3mcg 2 puffs daily and continue to use albuterol as needed for shortness of breath.

## 2021-02-03 ENCOUNTER — Encounter: Payer: Self-pay | Admitting: Pulmonary Disease

## 2021-02-03 ENCOUNTER — Other Ambulatory Visit: Payer: PPO

## 2021-02-04 DIAGNOSIS — Z794 Long term (current) use of insulin: Secondary | ICD-10-CM | POA: Diagnosis not present

## 2021-02-04 DIAGNOSIS — E119 Type 2 diabetes mellitus without complications: Secondary | ICD-10-CM | POA: Diagnosis not present

## 2021-02-10 ENCOUNTER — Encounter (HOSPITAL_COMMUNITY)
Admission: RE | Admit: 2021-02-10 | Discharge: 2021-02-10 | Disposition: A | Discharge status: Home / Self Care | Payer: PPO | Source: Ambulatory Visit | Attending: Nephrology | Admitting: Nephrology

## 2021-02-10 ENCOUNTER — Other Ambulatory Visit: Payer: Self-pay

## 2021-02-10 VITALS — BP 125/76 | HR 52 | Temp 97.5°F | Resp 18

## 2021-02-10 DIAGNOSIS — N179 Acute kidney failure, unspecified: Secondary | ICD-10-CM

## 2021-02-10 LAB — POCT HEMOGLOBIN-HEMACUE: Hemoglobin: 9.9 g/dL — ABNORMAL LOW (ref 13.0–17.0)

## 2021-02-10 MED ORDER — EPOETIN ALFA-EPBX 40000 UNIT/ML IJ SOLN
30000.0000 [IU] | INTRAMUSCULAR | Status: DC
  Administered 2021-02-10: 30000 [IU] via SUBCUTANEOUS

## 2021-02-10 MED ORDER — EPOETIN ALFA-EPBX 40000 UNIT/ML IJ SOLN
INTRAMUSCULAR | Status: AC
  Filled 2021-02-10: qty 1

## 2021-02-22 ENCOUNTER — Encounter: Payer: Self-pay | Admitting: Dietician

## 2021-02-22 ENCOUNTER — Encounter: Payer: PPO | Attending: Internal Medicine | Admitting: Dietician

## 2021-02-22 ENCOUNTER — Other Ambulatory Visit: Payer: Self-pay

## 2021-02-22 DIAGNOSIS — N184 Chronic kidney disease, stage 4 (severe): Secondary | ICD-10-CM | POA: Insufficient documentation

## 2021-02-22 DIAGNOSIS — E118 Type 2 diabetes mellitus with unspecified complications: Secondary | ICD-10-CM | POA: Insufficient documentation

## 2021-02-22 NOTE — Progress Notes (Signed)
Diabetes Self-Management Education  Visit Type: Follow-up  Appt. Start Time: 1405 Appt. End Time: 1440  02/23/2021  [name], identified by name and date of birth, is a 70 y.o. male with a diagnosis of Diabetes:  .   ASSESSMENT Patient is here today with his wife.  He was last seen by myself 01/04/2021. He states that he feels weak and has been having issues with blood counts. He states that the MD tried to put him on the kidney donor list and this has been denied. He is follow a low potassium, low sodium low protein diet, carbohydrate modified diet. He reports increased thirst each night. Levemir was decreased to 5 units daily.   He has not been having any low blood sugar.   Fasting and throughout the day blood glucose readings 90-115 and noted spike to 170 after regular gingerale.   He states that he needs to walk more.  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  He had been on prednisone but this has now been stopped. Labs noted to include A1C 10.2% 11/20/2020 eGFR 14, BUN 83, Creatine 4.3, Potassium 5.2 10/05/2020. Medications include Levemir 5 units at noon daily.  Novolog has been stopped.  Weight hx: 179 lbs 02/22/21 182 lbs 01/04/21 186 lbs 09/2020  Patient lives with his wife.  She does the cooking and shopping. Patient is retired from [employer].  Weight 179 lb (81.2 kg). Body mass index is 26.43 kg/m.   Diabetes Self-Management Education - 02/23/21 0847      Visit Information   Visit Type Follow-up      Pre-Education Assessment   Patient understands the diabetes disease and treatment process. Needs Review    Patient understands incorporating nutritional management into lifestyle. Needs Review    Patient undertands incorporating physical activity into lifestyle. Needs Review    Patient understands using medications safely. Needs Review    Patient understands  monitoring blood glucose, interpreting and using results Needs Review    Patient understands prevention, detection, and treatment of acute complications. Needs Review    Patient understands prevention, detection, and treatment of chronic complications. Needs Review    Patient understands how to develop strategies to address psychosocial issues. Needs Review    Patient understands how to develop strategies to promote health/change behavior. Needs Review      Complications   How often do you check your blood sugar? 1-2 times/day    Fasting Blood glucose range (mg/dL) 70-129    Postprandial Blood glucose range (mg/dL) 70-129    Number of hypoglycemic episodes per month 0      Dietary Intake   Breakfast 1-2 egg sandwiches, fruit    Snack (morning) none    Lunch skips or fruit    Snack (afternoon) occasional fruit, plain popcorn, or 1/2 cup ice cream 3 times per week    Dinner chicken, pork, or steak, occasional rice or pasta, salad, vegetables    Beverage(s) water, tea,occasional sugar free lemonade      Exercise   Exercise Type ADL's      Patient Education   Previous Diabetes Education Yes (please comment)   12/2020   Nutrition management  Meal options for control of blood glucose level and chronic complications.   and CKD   Physical activity and exercise  Helped patient identify appropriate exercises in relation to his/her diabetes, diabetes complications and other health issue.    Medications Reviewed patients medication  for diabetes, action, purpose, timing of dose and side effects.    Monitoring Identified appropriate SMBG and/or A1C goals.    Psychosocial adjustment Identified and addressed patients feelings and concerns about diabetes   and CKD     Individualized Goals (developed by patient)   Nutrition General guidelines for healthy choices and portions discussed    Physical Activity Exercise 3-5 times per week;15 minutes per day    Medications take my medication as prescribed     Monitoring  test my blood glucose as discussed    Reducing Risk increase portions of healthy fats;examine blood glucose patterns    Health Coping discuss diabetes with (comment)   MD, RD, CDCES     Patient Self-Evaluation of Goals - Patient rates self as meeting previously set goals (% of time)   Nutrition >75%    Physical Activity >75%    Medications >75%    Monitoring >75%    Problem Solving >75%    Reducing Risk >75%    Health Coping >75%      Post-Education Assessment   Patient understands the diabetes disease and treatment process. Demonstrates understanding / competency    Patient understands incorporating nutritional management into lifestyle. Demonstrates understanding / competency    Patient undertands incorporating physical activity into lifestyle. Demonstrates understanding / competency    Patient understands using medications safely. Demonstrates understanding / competency    Patient understands monitoring blood glucose, interpreting and using results Demonstrates understanding / competency    Patient understands prevention, detection, and treatment of acute complications. Demonstrates understanding / competency    Patient understands prevention, detection, and treatment of chronic complications. Demonstrates understanding / competency    Patient understands how to develop strategies to address psychosocial issues. Demonstrates understanding / competency    Patient understands how to develop strategies to promote health/change behavior. Demonstrates understanding / competency      Outcomes   Expected Outcomes Demonstrated interest in learning. Expect positive outcomes    Future DMSE PRN    Program Status Completed      Subsequent Visit   Since your last visit have you experienced any weight changes? Loss    Weight Loss (lbs) 3    Since your last visit, are you checking your blood glucose at least once a day? Yes           Individualized Plan for Diabetes  Self-Management Training:   Learning Objective:  Patient will have a greater understanding of diabetes self-management. Patient education plan is to attend individual and/or group sessions per assessed needs and concerns.   Plan:   Patient Instructions  Continue to follow a low sodium, low potassium diet that is low in protein. Continue to stay hydrated.  Small amounts of exercise and gradually increase as tolerated.  Walking would be a good place to start.  Be sure to eat adequate amounts to maintain your energy level.  You can have 6 starch portions spread out throughout the day (popcorn, rice, pasta, corn, peas, grits, oatmeal, bread, bun, english muffin, roll, pita, tortilla, graham crackers, unsalted crackers for example).    Continue to monitor your blood sugar and take medications as prescribed.  Please call for any questions!   Expected Outcomes:  Demonstrated interest in learning. Expect positive outcomes  Education material provided:   If problems or questions, patient to contact team via:  Phone  Future DSME appointment: PRN

## 2021-02-22 NOTE — Patient Instructions (Addendum)
Continue to follow a low sodium, low potassium diet that is low in protein. Continue to stay hydrated.  Small amounts of exercise and gradually increase as tolerated.  Walking would be a good place to start.  Be sure to eat adequate amounts to maintain your energy level.  You can have 6 starch portions spread out throughout the day (popcorn, rice, pasta, corn, peas, grits, oatmeal, bread, bun, english muffin, roll, pita, tortilla, graham crackers, unsalted crackers for example).    Continue to monitor your blood sugar and take medications as prescribed.  Please call for any questions!

## 2021-02-24 ENCOUNTER — Encounter (HOSPITAL_COMMUNITY)
Admission: RE | Admit: 2021-02-24 | Discharge: 2021-02-24 | Disposition: A | Discharge status: Home / Self Care | Payer: PPO | Source: Ambulatory Visit | Attending: Nephrology | Admitting: Nephrology

## 2021-02-24 ENCOUNTER — Other Ambulatory Visit: Payer: Self-pay

## 2021-02-24 VITALS — BP 135/67 | HR 59 | Temp 97.5°F | Resp 18

## 2021-02-24 DIAGNOSIS — D631 Anemia in chronic kidney disease: Secondary | ICD-10-CM | POA: Diagnosis not present

## 2021-02-24 DIAGNOSIS — N179 Acute kidney failure, unspecified: Secondary | ICD-10-CM | POA: Diagnosis not present

## 2021-02-24 LAB — IRON AND TIBC
Iron: 69 ug/dL (ref 45–182)
Saturation Ratios: 22 % (ref 17.9–39.5)
TIBC: 316 ug/dL (ref 250–450)
UIBC: 247 ug/dL

## 2021-02-24 LAB — POCT HEMOGLOBIN-HEMACUE: Hemoglobin: 10.3 g/dL — ABNORMAL LOW (ref 13.0–17.0)

## 2021-02-24 LAB — FERRITIN: Ferritin: 575 ng/mL — ABNORMAL HIGH (ref 24–336)

## 2021-02-24 MED ORDER — EPOETIN ALFA-EPBX 40000 UNIT/ML IJ SOLN
INTRAMUSCULAR | Status: AC
  Administered 2021-02-24: 30000 [IU] via SUBCUTANEOUS
  Filled 2021-02-24: qty 1

## 2021-02-24 MED ORDER — EPOETIN ALFA-EPBX 40000 UNIT/ML IJ SOLN: 30000.0000 [IU] | INTRAMUSCULAR | Status: DC

## 2021-03-01 ENCOUNTER — Other Ambulatory Visit: Payer: Self-pay | Admitting: Internal Medicine

## 2021-03-01 ENCOUNTER — Ambulatory Visit
Admission: RE | Admit: 2021-03-01 | Discharge: 2021-03-01 | Disposition: A | Discharge status: Home / Self Care | Payer: PPO | Source: Ambulatory Visit | Attending: Internal Medicine | Admitting: Internal Medicine

## 2021-03-01 DIAGNOSIS — N058 Unspecified nephritic syndrome with other morphologic changes: Secondary | ICD-10-CM | POA: Diagnosis not present

## 2021-03-01 DIAGNOSIS — D638 Anemia in other chronic diseases classified elsewhere: Secondary | ICD-10-CM | POA: Diagnosis not present

## 2021-03-01 DIAGNOSIS — E1165 Type 2 diabetes mellitus with hyperglycemia: Secondary | ICD-10-CM | POA: Diagnosis not present

## 2021-03-01 DIAGNOSIS — R200 Anesthesia of skin: Secondary | ICD-10-CM | POA: Diagnosis not present

## 2021-03-01 DIAGNOSIS — E099 Drug or chemical induced diabetes mellitus without complications: Secondary | ICD-10-CM | POA: Diagnosis not present

## 2021-03-01 DIAGNOSIS — I10 Essential (primary) hypertension: Secondary | ICD-10-CM | POA: Diagnosis not present

## 2021-03-01 DIAGNOSIS — M25559 Pain in unspecified hip: Secondary | ICD-10-CM

## 2021-03-01 DIAGNOSIS — N185 Chronic kidney disease, stage 5: Secondary | ICD-10-CM | POA: Diagnosis not present

## 2021-03-01 DIAGNOSIS — N2581 Secondary hyperparathyroidism of renal origin: Secondary | ICD-10-CM | POA: Diagnosis not present

## 2021-03-01 DIAGNOSIS — N057 Unspecified nephritic syndrome with diffuse crescentic glomerulonephritis: Secondary | ICD-10-CM | POA: Diagnosis not present

## 2021-03-01 DIAGNOSIS — T380X5S Adverse effect of glucocorticoids and synthetic analogues, sequela: Secondary | ICD-10-CM | POA: Diagnosis not present

## 2021-03-01 DIAGNOSIS — M25551 Pain in right hip: Secondary | ICD-10-CM | POA: Diagnosis not present

## 2021-03-01 DIAGNOSIS — Z0001 Encounter for general adult medical examination with abnormal findings: Secondary | ICD-10-CM | POA: Diagnosis not present

## 2021-03-02 ENCOUNTER — Other Ambulatory Visit: Payer: Self-pay | Admitting: Internal Medicine

## 2021-03-02 DIAGNOSIS — R1319 Other dysphagia: Secondary | ICD-10-CM

## 2021-03-10 ENCOUNTER — Other Ambulatory Visit: Payer: Self-pay

## 2021-03-10 ENCOUNTER — Encounter (HOSPITAL_COMMUNITY)
Admission: RE | Admit: 2021-03-10 | Discharge: 2021-03-10 | Disposition: A | Discharge status: Home / Self Care | Payer: PPO | Source: Ambulatory Visit | Attending: Nephrology | Admitting: Nephrology

## 2021-03-10 VITALS — BP 134/71 | HR 60 | Temp 97.5°F | Resp 18

## 2021-03-10 DIAGNOSIS — N179 Acute kidney failure, unspecified: Secondary | ICD-10-CM

## 2021-03-10 LAB — POCT HEMOGLOBIN-HEMACUE: Hemoglobin: 11 g/dL — ABNORMAL LOW (ref 13.0–17.0)

## 2021-03-10 MED ORDER — EPOETIN ALFA-EPBX 40000 UNIT/ML IJ SOLN
INTRAMUSCULAR | Status: AC
  Filled 2021-03-10: qty 1

## 2021-03-10 MED ORDER — EPOETIN ALFA-EPBX 40000 UNIT/ML IJ SOLN
30000.0000 [IU] | INTRAMUSCULAR | Status: DC
  Administered 2021-03-10: 30000 [IU] via SUBCUTANEOUS

## 2021-03-15 DIAGNOSIS — N185 Chronic kidney disease, stage 5: Secondary | ICD-10-CM | POA: Diagnosis not present

## 2021-03-22 DIAGNOSIS — N2581 Secondary hyperparathyroidism of renal origin: Secondary | ICD-10-CM | POA: Diagnosis not present

## 2021-03-22 DIAGNOSIS — E875 Hyperkalemia: Secondary | ICD-10-CM | POA: Diagnosis not present

## 2021-03-22 DIAGNOSIS — I714 Abdominal aortic aneurysm, without rupture: Secondary | ICD-10-CM | POA: Diagnosis not present

## 2021-03-22 DIAGNOSIS — I120 Hypertensive chronic kidney disease with stage 5 chronic kidney disease or end stage renal disease: Secondary | ICD-10-CM | POA: Diagnosis not present

## 2021-03-22 DIAGNOSIS — N057 Unspecified nephritic syndrome with diffuse crescentic glomerulonephritis: Secondary | ICD-10-CM | POA: Diagnosis not present

## 2021-03-22 DIAGNOSIS — N058 Unspecified nephritic syndrome with other morphologic changes: Secondary | ICD-10-CM | POA: Diagnosis not present

## 2021-03-22 DIAGNOSIS — N185 Chronic kidney disease, stage 5: Secondary | ICD-10-CM | POA: Diagnosis not present

## 2021-03-22 DIAGNOSIS — D631 Anemia in chronic kidney disease: Secondary | ICD-10-CM | POA: Diagnosis not present

## 2021-03-22 DIAGNOSIS — E872 Acidosis: Secondary | ICD-10-CM | POA: Diagnosis not present

## 2021-03-24 ENCOUNTER — Encounter (HOSPITAL_COMMUNITY)
Admission: RE | Admit: 2021-03-24 | Discharge: 2021-03-24 | Disposition: A | Discharge status: Home / Self Care | Payer: PPO | Source: Ambulatory Visit | Attending: Nephrology | Admitting: Nephrology

## 2021-03-24 ENCOUNTER — Other Ambulatory Visit: Payer: Self-pay

## 2021-03-24 VITALS — BP 128/94 | HR 61 | Resp 18

## 2021-03-24 DIAGNOSIS — N179 Acute kidney failure, unspecified: Secondary | ICD-10-CM | POA: Diagnosis not present

## 2021-03-24 DIAGNOSIS — D631 Anemia in chronic kidney disease: Secondary | ICD-10-CM | POA: Insufficient documentation

## 2021-03-24 LAB — IRON AND TIBC
Iron: 68 ug/dL (ref 45–182)
Saturation Ratios: 23 % (ref 17.9–39.5)
TIBC: 297 ug/dL (ref 250–450)
UIBC: 229 ug/dL

## 2021-03-24 LAB — FERRITIN: Ferritin: 415 ng/mL — ABNORMAL HIGH (ref 24–336)

## 2021-03-24 MED ORDER — EPOETIN ALFA-EPBX 40000 UNIT/ML IJ SOLN: 30000.0000 [IU] | INTRAMUSCULAR | Status: DC

## 2021-03-24 MED ORDER — EPOETIN ALFA-EPBX 40000 UNIT/ML IJ SOLN
INTRAMUSCULAR | Status: AC
  Administered 2021-03-24: 30000 [IU] via SUBCUTANEOUS
  Filled 2021-03-24: qty 1

## 2021-03-26 ENCOUNTER — Ambulatory Visit
Admission: RE | Admit: 2021-03-26 | Discharge: 2021-03-26 | Disposition: A | Discharge status: Home / Self Care | Payer: PPO | Source: Ambulatory Visit | Attending: Internal Medicine | Admitting: Internal Medicine

## 2021-03-26 DIAGNOSIS — R1310 Dysphagia, unspecified: Secondary | ICD-10-CM | POA: Diagnosis not present

## 2021-03-26 DIAGNOSIS — R1319 Other dysphagia: Secondary | ICD-10-CM

## 2021-03-31 LAB — POCT HEMOGLOBIN-HEMACUE: Hemoglobin: 11.7 g/dL — ABNORMAL LOW (ref 13.0–17.0)

## 2021-04-07 ENCOUNTER — Encounter (HOSPITAL_COMMUNITY)
Admission: RE | Admit: 2021-04-07 | Discharge: 2021-04-07 | Disposition: A | Discharge status: Home / Self Care | Payer: PPO | Source: Ambulatory Visit | Attending: Nephrology | Admitting: Nephrology

## 2021-04-07 ENCOUNTER — Other Ambulatory Visit: Payer: Self-pay

## 2021-04-07 VITALS — BP 146/78 | HR 63 | Temp 97.4°F | Resp 18

## 2021-04-07 DIAGNOSIS — N179 Acute kidney failure, unspecified: Secondary | ICD-10-CM | POA: Diagnosis not present

## 2021-04-07 LAB — POCT HEMOGLOBIN-HEMACUE: Hemoglobin: 11.3 g/dL — ABNORMAL LOW (ref 13.0–17.0)

## 2021-04-07 MED ORDER — EPOETIN ALFA-EPBX 40000 UNIT/ML IJ SOLN
30000.0000 [IU] | INTRAMUSCULAR | Status: DC
  Administered 2021-04-07: 30000 [IU] via SUBCUTANEOUS

## 2021-04-07 MED ORDER — EPOETIN ALFA-EPBX 40000 UNIT/ML IJ SOLN
INTRAMUSCULAR | Status: AC
  Filled 2021-04-07: qty 1

## 2021-04-08 ENCOUNTER — Other Ambulatory Visit: Payer: Self-pay | Admitting: *Deleted

## 2021-04-08 DIAGNOSIS — R3121 Asymptomatic microscopic hematuria: Secondary | ICD-10-CM | POA: Diagnosis not present

## 2021-04-08 DIAGNOSIS — N200 Calculus of kidney: Secondary | ICD-10-CM | POA: Diagnosis not present

## 2021-04-08 DIAGNOSIS — D631 Anemia in chronic kidney disease: Secondary | ICD-10-CM

## 2021-04-20 DIAGNOSIS — K219 Gastro-esophageal reflux disease without esophagitis: Secondary | ICD-10-CM | POA: Diagnosis not present

## 2021-04-20 DIAGNOSIS — Z86010 Personal history of colonic polyps: Secondary | ICD-10-CM | POA: Diagnosis not present

## 2021-04-20 DIAGNOSIS — R1310 Dysphagia, unspecified: Secondary | ICD-10-CM | POA: Diagnosis not present

## 2021-04-21 ENCOUNTER — Encounter (HOSPITAL_COMMUNITY)
Admission: RE | Admit: 2021-04-21 | Discharge: 2021-04-21 | Disposition: A | Discharge status: Home / Self Care | Payer: PPO | Source: Ambulatory Visit | Attending: Nephrology | Admitting: Nephrology

## 2021-04-21 ENCOUNTER — Other Ambulatory Visit: Payer: Self-pay

## 2021-04-21 VITALS — BP 153/82 | HR 58 | Temp 97.3°F | Resp 18

## 2021-04-21 DIAGNOSIS — D631 Anemia in chronic kidney disease: Secondary | ICD-10-CM | POA: Diagnosis not present

## 2021-04-21 DIAGNOSIS — N179 Acute kidney failure, unspecified: Secondary | ICD-10-CM | POA: Diagnosis not present

## 2021-04-21 LAB — IRON AND TIBC
Iron: 83 ug/dL (ref 45–182)
Saturation Ratios: 29 % (ref 17.9–39.5)
TIBC: 291 ug/dL (ref 250–450)
UIBC: 208 ug/dL

## 2021-04-21 LAB — POCT HEMOGLOBIN-HEMACUE: Hemoglobin: 11.8 g/dL — ABNORMAL LOW (ref 13.0–17.0)

## 2021-04-21 LAB — FERRITIN: Ferritin: 490 ng/mL — ABNORMAL HIGH (ref 24–336)

## 2021-04-21 MED ORDER — EPOETIN ALFA-EPBX 40000 UNIT/ML IJ SOLN
30000.0000 [IU] | INTRAMUSCULAR | Status: DC
  Administered 2021-04-21: 30000 [IU] via SUBCUTANEOUS

## 2021-04-21 MED ORDER — EPOETIN ALFA-EPBX 40000 UNIT/ML IJ SOLN
INTRAMUSCULAR | Status: AC
  Filled 2021-04-21: qty 1

## 2021-04-29 ENCOUNTER — Other Ambulatory Visit: Payer: Self-pay

## 2021-04-29 ENCOUNTER — Ambulatory Visit (INDEPENDENT_AMBULATORY_CARE_PROVIDER_SITE_OTHER)
Admission: RE | Admit: 2021-04-29 | Discharge: 2021-04-29 | Disposition: A | Discharge status: Home / Self Care | Payer: PPO | Source: Ambulatory Visit | Attending: Vascular Surgery | Admitting: Vascular Surgery

## 2021-04-29 ENCOUNTER — Ambulatory Visit (HOSPITAL_COMMUNITY)
Admission: RE | Admit: 2021-04-29 | Discharge: 2021-04-29 | Disposition: A | Discharge status: Home / Self Care | Payer: PPO | Source: Ambulatory Visit | Attending: Vascular Surgery | Admitting: Vascular Surgery

## 2021-04-29 ENCOUNTER — Encounter: Payer: Self-pay | Admitting: Vascular Surgery

## 2021-04-29 ENCOUNTER — Ambulatory Visit: Payer: PPO | Admitting: Vascular Surgery

## 2021-04-29 VITALS — BP 145/80 | HR 54 | Temp 98.0°F | Resp 20 | Ht 69.0 in | Wt 178.0 lb

## 2021-04-29 DIAGNOSIS — N185 Chronic kidney disease, stage 5: Secondary | ICD-10-CM | POA: Diagnosis not present

## 2021-04-29 DIAGNOSIS — D631 Anemia in chronic kidney disease: Secondary | ICD-10-CM | POA: Insufficient documentation

## 2021-04-29 DIAGNOSIS — N184 Chronic kidney disease, stage 4 (severe): Secondary | ICD-10-CM

## 2021-04-29 NOTE — Progress Notes (Signed)
Referring Physician: Dr. Carolin Sicks  Patient name: Taylor King MRN: [id number] DOB: 16-Apr-1951 Sex: male  REASON FOR CONSULT: Hemodialysis access  HPI: Taylor King is a 70 y.o. 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  He is referred for placement of hemodialysis access.  He is not currently on dialysis.  He is right-handed.  He has not had any prior access procedures.  Other medical problems include abdominal aortic aneurysm.  This is followed on serial ultrasound by his primary care physician.  Last diameter was around 3 cm.  Past Medical History:  Diagnosis Date   Arthritis    Chronic back pain    Chronic kidney disease    Diabetes mellitus without complication (HCC)    Diverticulitis    Dyspnea    with exertion   GERD (gastroesophageal reflux disease)    occ   History of kidney stones    Peripheral vascular disease (HCC)    aaa 39 mm 02-14-17 epic [place]   Past Surgical History:  Procedure Laterality Date   adenoids removed   age 56   ANTERIOR LAT LUMBAR FUSION N/A 03/26/2020   Procedure: ANTERIOR LATERAL LUMBAR FUSION (XLIF) LUMBAR TWO-THREE WITH POSTERIOR SPINAL FUSION INTERBODY LUMBAR TWO-THREE;  Surgeon: Melina Schools, MD;  Location: [facility];  Service: Orthopedics;  Laterality: N/A;  4 hrs Left tap block with exparel   COLONOSCOPY WITH PROPOFOL N/A 03/07/2017   Procedure: COLONOSCOPY WITH PROPOFOL;  Surgeon: Garlan Fair, MD;  Location: [facility];  Service: Endoscopy;  Laterality: N/A;   colonscopy     x 3   KNEE ARTHROSCOPY  [id number]   left knee   TONSILLECTOMY  age 23   TOTAL KNEE ARTHROPLASTY  11/25/2011   Procedure: TOTAL KNEE ARTHROPLASTY;  Surgeon: Newt Minion, MD;  Location: [facility];  Service: Orthopedics;  Laterality: Left;  Left Total Knee Arthroplasty    Family History  Problem Relation Age of Onset   Emphysema Father     SOCIAL HISTORY: Social History   Socioeconomic History   Marital status: Married    Spouse name: Not on  file   Number of children: Not on file   Years of education: Not on file   Highest education level: Not on file  Occupational History   Not on file  Tobacco Use   Smoking status: Former    Packs/day: 0.25    Years: 30.00    Pack years: 7.50    Types: Cigarettes    Quit date: 12/25/2013    Years since quitting: 7.3   Smokeless tobacco: Never  Vaping Use   Vaping Use: Never used  Substance and Sexual Activity   Alcohol use: No   Drug use: No   Sexual activity: Not on file  Other Topics Concern   Not on file  Social History Narrative   Not on file   Social Determinants of Health   Financial Resource Strain: Not on file  Food Insecurity: Not on file  Transportation Needs: Not on file  Physical Activity: Not on file  Stress: Not on file  Social Connections: Not on file  Intimate Partner Violence: Not on file    Allergies  Allergen Reactions   Other Other (See Comments)    Anesthesia medication (pt unsure of which med)- reaction upset stomach. (after colonoscopy)    Current Outpatient Medications  Medication Sig Dispense Refill   albuterol (VENTOLIN HFA) 108 (90 Base) MCG/ACT inhaler Inhale 2 puffs into the lungs  every 6 (six) hours as needed for wheezing or shortness of breath. 8 g 6   atenolol (TENORMIN) 25 MG tablet Take by mouth.     Cholecalciferol (VITAMIN D) 50 MCG (2000 UT) CAPS 1 capsule     Tiotropium Bromide Monohydrate (SPIRIVA RESPIMAT) 2.5 MCG/ACT AERS Inhale 2 puffs into the lungs daily. 4 g 6   vitamin B-12 (CYANOCOBALAMIN) 1000 MCG tablet Take 1,000 mcg by mouth daily.     No current facility-administered medications for this visit.    ROS:   General:  No weight loss, Fever, chills  HEENT: No recent headaches, no nasal bleeding, no visual changes, no sore throat  Neurologic: No dizziness, blackouts, seizures. No recent symptoms of stroke or mini- stroke. No recent episodes of slurred speech, or temporary blindness.  Cardiac: No recent episodes  of chest pain/pressure, no shortness of breath at rest.  No shortness of breath with exertion.  Denies history of atrial fibrillation or irregular heartbeat  Vascular: No history of rest pain in feet.  No history of claudication.  No history of non-healing ulcer, No history of DVT   Pulmonary: No home oxygen, no productive cough, no hemoptysis,  No asthma or wheezing  Musculoskeletal:  [ ]  Arthritis, [ ]  Low back pain,  [ ]  Joint pain  Hematologic:No history of hypercoagulable state.  No history of easy bleeding.  No history of anemia  Gastrointestinal: No hematochezia or melena,  No gastroesophageal reflux, no trouble swallowing  Urinary: [X]  chronic Kidney disease, [ ]  on HD - [ ]  MWF or [ ]  TTHS, [ ]  Burning with urination, [ ]  Frequent urination, [ ]  Difficulty urinating;   Skin: No rashes  Psychological: No history of anxiety,  No history of depression   Physical Examination  Vitals:   04/29/21 1328  BP: (!) 145/80  Pulse: (!) 54  Resp: 20  Temp: 98 F (36.7 C)  SpO2: 95%  Weight: 178 lb (80.7 kg)  Height: 5\' 9"  (1.753 m)    Body mass index is 26.29 kg/m.  General:  Alert and oriented, no acute distress HEENT: Normal Neck: No JVD Cardiac: Regular Rate and Rhythm  Abdomen: Soft, non-tender, non-distended, no mass Skin: No rash Extremity Pulses:  2+ radial, brachial pulses bilaterally Musculoskeletal: No deformity or edema  Neurologic: Upper and lower extremity motor 5/5 and symmetric  DATA:  Patient had upper extremity arterial duplex today which showed no significant stenosis.  He also had a vein mapping ultrasound today.  This showed reasonable cephalic and basilic vein in the right arm of about 3 to 4 mm primarily in the upper arm.  Left arm the upper arm cephalic vein was also about 3 mm.  ASSESSMENT: Patient needs long-term hemodialysis access.  Since he is right-handed has adequate left upper arm cephalic vein a believe best first option will be left  brachiocephalic AV fistula.  Risk benefits possible complications and procedure details were discussed with patient and his wife today including not limited to bleeding infection on maturation of the fistula and ischemic steal.  He understands and agrees to proceed.   PLAN: Left brachiocephalic AV fistula scheduled for Monday, May 10, 2021.   Ruta Hinds, MD [facility] Office: [phone]

## 2021-04-29 NOTE — H&P (View-Only) (Signed)
Referring Physician: Dr. Carolin Sicks  Patient name: Taylor King MRN: [id number] DOB: 20-Apr-1951 Sex: male  REASON FOR CONSULT: Hemodialysis access  HPI: Taylor King is a 70 y.o. 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  He is referred for placement of hemodialysis access.  He is not currently on dialysis.  He is right-handed.  He has not had any prior access procedures.  Other medical problems include abdominal aortic aneurysm.  This is followed on serial ultrasound by his primary care physician.  Last diameter was around 3 cm.  Past Medical History:  Diagnosis Date   Arthritis    Chronic back pain    Chronic kidney disease    Diabetes mellitus without complication (HCC)    Diverticulitis    Dyspnea    with exertion   GERD (gastroesophageal reflux disease)    occ   History of kidney stones    Peripheral vascular disease (HCC)    aaa 39 mm 02-14-17 epic [place]   Past Surgical History:  Procedure Laterality Date   adenoids removed   age 29   ANTERIOR LAT LUMBAR FUSION N/A 03/26/2020   Procedure: ANTERIOR LATERAL LUMBAR FUSION (XLIF) LUMBAR TWO-THREE WITH POSTERIOR SPINAL FUSION INTERBODY LUMBAR TWO-THREE;  Surgeon: Melina Schools, MD;  Location: [facility];  Service: Orthopedics;  Laterality: N/A;  4 hrs Left tap block with exparel   COLONOSCOPY WITH PROPOFOL N/A 03/07/2017   Procedure: COLONOSCOPY WITH PROPOFOL;  Surgeon: Garlan Fair, MD;  Location: [facility];  Service: Endoscopy;  Laterality: N/A;   colonscopy     x 3   KNEE ARTHROSCOPY  [id number]   left knee   TONSILLECTOMY  age 17   TOTAL KNEE ARTHROPLASTY  11/25/2011   Procedure: TOTAL KNEE ARTHROPLASTY;  Surgeon: Newt Minion, MD;  Location: [facility];  Service: Orthopedics;  Laterality: Left;  Left Total Knee Arthroplasty    Family History  Problem Relation Age of Onset   Emphysema Father     SOCIAL HISTORY: Social History   Socioeconomic History   Marital status: Married    Spouse name: Not on  file   Number of children: Not on file   Years of education: Not on file   Highest education level: Not on file  Occupational History   Not on file  Tobacco Use   Smoking status: Former    Packs/day: 0.25    Years: 30.00    Pack years: 7.50    Types: Cigarettes    Quit date: 12/25/2013    Years since quitting: 7.3   Smokeless tobacco: Never  Vaping Use   Vaping Use: Never used  Substance and Sexual Activity   Alcohol use: No   Drug use: No   Sexual activity: Not on file  Other Topics Concern   Not on file  Social History Narrative   Not on file   Social Determinants of Health   Financial Resource Strain: Not on file  Food Insecurity: Not on file  Transportation Needs: Not on file  Physical Activity: Not on file  Stress: Not on file  Social Connections: Not on file  Intimate Partner Violence: Not on file    Allergies  Allergen Reactions   Other Other (See Comments)    Anesthesia medication (pt unsure of which med)- reaction upset stomach. (after colonoscopy)    Current Outpatient Medications  Medication Sig Dispense Refill   albuterol (VENTOLIN HFA) 108 (90 Base) MCG/ACT inhaler Inhale 2 puffs into the lungs  every 6 (six) hours as needed for wheezing or shortness of breath. 8 g 6   atenolol (TENORMIN) 25 MG tablet Take by mouth.     Cholecalciferol (VITAMIN D) 50 MCG (2000 UT) CAPS 1 capsule     Tiotropium Bromide Monohydrate (SPIRIVA RESPIMAT) 2.5 MCG/ACT AERS Inhale 2 puffs into the lungs daily. 4 g 6   vitamin B-12 (CYANOCOBALAMIN) 1000 MCG tablet Take 1,000 mcg by mouth daily.     No current facility-administered medications for this visit.    ROS:   General:  No weight loss, Fever, chills  HEENT: No recent headaches, no nasal bleeding, no visual changes, no sore throat  Neurologic: No dizziness, blackouts, seizures. No recent symptoms of stroke or mini- stroke. No recent episodes of slurred speech, or temporary blindness.  Cardiac: No recent episodes  of chest pain/pressure, no shortness of breath at rest.  No shortness of breath with exertion.  Denies history of atrial fibrillation or irregular heartbeat  Vascular: No history of rest pain in feet.  No history of claudication.  No history of non-healing ulcer, No history of DVT   Pulmonary: No home oxygen, no productive cough, no hemoptysis,  No asthma or wheezing  Musculoskeletal:  [ ]  Arthritis, [ ]  Low back pain,  [ ]  Joint pain  Hematologic:No history of hypercoagulable state.  No history of easy bleeding.  No history of anemia  Gastrointestinal: No hematochezia or melena,  No gastroesophageal reflux, no trouble swallowing  Urinary: [X]  chronic Kidney disease, [ ]  on HD - [ ]  MWF or [ ]  TTHS, [ ]  Burning with urination, [ ]  Frequent urination, [ ]  Difficulty urinating;   Skin: No rashes  Psychological: No history of anxiety,  No history of depression   Physical Examination  Vitals:   04/29/21 1328  BP: (!) 145/80  Pulse: (!) 54  Resp: 20  Temp: 98 F (36.7 C)  SpO2: 95%  Weight: 178 lb (80.7 kg)  Height: 5\' 9"  (1.753 m)    Body mass index is 26.29 kg/m.  General:  Alert and oriented, no acute distress HEENT: Normal Neck: No JVD Cardiac: Regular Rate and Rhythm  Abdomen: Soft, non-tender, non-distended, no mass Skin: No rash Extremity Pulses:  2+ radial, brachial pulses bilaterally Musculoskeletal: No deformity or edema  Neurologic: Upper and lower extremity motor 5/5 and symmetric  DATA:  Patient had upper extremity arterial duplex today which showed no significant stenosis.  He also had a vein mapping ultrasound today.  This showed reasonable cephalic and basilic vein in the right arm of about 3 to 4 mm primarily in the upper arm.  Left arm the upper arm cephalic vein was also about 3 mm.  ASSESSMENT: Patient needs long-term hemodialysis access.  Since he is right-handed has adequate left upper arm cephalic vein a believe best first option will be left  brachiocephalic AV fistula.  Risk benefits possible complications and procedure details were discussed with patient and his wife today including not limited to bleeding infection on maturation of the fistula and ischemic steal.  He understands and agrees to proceed.   PLAN: Left brachiocephalic AV fistula scheduled for Monday, May 10, 2021.   Ruta Hinds, MD [facility] Office: [phone]

## 2021-05-05 ENCOUNTER — Other Ambulatory Visit: Payer: Self-pay

## 2021-05-05 ENCOUNTER — Encounter (HOSPITAL_COMMUNITY)
Admission: RE | Admit: 2021-05-05 | Discharge: 2021-05-05 | Disposition: A | Discharge status: Home / Self Care | Payer: PPO | Source: Ambulatory Visit | Attending: Nephrology | Admitting: Nephrology

## 2021-05-05 VITALS — BP 154/69 | HR 57 | Temp 97.5°F | Resp 20

## 2021-05-05 DIAGNOSIS — N179 Acute kidney failure, unspecified: Secondary | ICD-10-CM | POA: Diagnosis not present

## 2021-05-05 LAB — POCT HEMOGLOBIN-HEMACUE: Hemoglobin: 11.4 g/dL — ABNORMAL LOW (ref 13.0–17.0)

## 2021-05-05 MED ORDER — EPOETIN ALFA-EPBX 40000 UNIT/ML IJ SOLN
INTRAMUSCULAR | Status: AC
  Filled 2021-05-05: qty 1

## 2021-05-05 MED ORDER — EPOETIN ALFA-EPBX 40000 UNIT/ML IJ SOLN
30000.0000 [IU] | INTRAMUSCULAR | Status: DC
  Administered 2021-05-05: 30000 [IU] via SUBCUTANEOUS

## 2021-05-07 ENCOUNTER — Encounter (HOSPITAL_COMMUNITY): Payer: Self-pay | Admitting: Vascular Surgery

## 2021-05-07 ENCOUNTER — Other Ambulatory Visit: Payer: Self-pay

## 2021-05-07 NOTE — Progress Notes (Signed)
DUE TO COVID-19 ONLY ONE VISITOR IS ALLOWED TO COME WITH YOU AND STAY IN THE WAITING ROOM ONLY DURING PRE OP AND PROCEDURE DAY OF SURGERY.   PCP - Dr Seward Carol Cardiologist - n/a Oncology - Dr Nolon Stalls Pulmonology - Dr Freda Jackson Nephrology - Dr Lawson Radar  CT Chest x-ray - 03/26/21 EKG - 09/26/20 Stress Test - Normal stress test 11/30/04 ECHO - n/a Cardiac Cath - n/a  Anesthesia review: Yes  STOP now taking any Aspirin (unless otherwise instructed by your surgeon), Aleve, Naproxen, Ibuprofen, Motrin, Advil, Goody's, BC's, all herbal medications, fish oil, and all vitamins.   Coronavirus Screening Covid test n/a - Ambulatory Surgery  Do you have any of the following symptoms:  Cough yes/no: No Fever (>100.103F)  yes/no: No Runny nose yes/no: No Sore throat yes/no: No Difficulty breathing/shortness of breath  Yes-COPD  Have you traveled in the last 14 days and where? yes/no: No  Patient verbalized understanding of instructions that were given via phone.

## 2021-05-10 ENCOUNTER — Ambulatory Visit (HOSPITAL_COMMUNITY): Payer: PPO | Admitting: Physician Assistant

## 2021-05-10 ENCOUNTER — Encounter (HOSPITAL_COMMUNITY): Payer: Self-pay | Admitting: Vascular Surgery

## 2021-05-10 ENCOUNTER — Ambulatory Visit (HOSPITAL_COMMUNITY)
Admission: RE | Admit: 2021-05-10 | Discharge: 2021-05-10 | Disposition: A | Discharge status: Home / Self Care | Payer: PPO | Attending: Vascular Surgery | Admitting: Vascular Surgery

## 2021-05-10 ENCOUNTER — Encounter (HOSPITAL_COMMUNITY)
Admission: RE | Disposition: A | Discharge status: Home / Self Care | Payer: Self-pay | Source: Home / Self Care | Attending: Vascular Surgery

## 2021-05-10 ENCOUNTER — Other Ambulatory Visit: Payer: Self-pay

## 2021-05-10 DIAGNOSIS — I714 Abdominal aortic aneurysm, without rupture: Secondary | ICD-10-CM | POA: Insufficient documentation

## 2021-05-10 DIAGNOSIS — E1122 Type 2 diabetes mellitus with diabetic chronic kidney disease: Secondary | ICD-10-CM | POA: Diagnosis not present

## 2021-05-10 DIAGNOSIS — N179 Acute kidney failure, unspecified: Secondary | ICD-10-CM | POA: Diagnosis not present

## 2021-05-10 DIAGNOSIS — D631 Anemia in chronic kidney disease: Secondary | ICD-10-CM | POA: Diagnosis not present

## 2021-05-10 DIAGNOSIS — Z87891 Personal history of nicotine dependence: Secondary | ICD-10-CM | POA: Insufficient documentation

## 2021-05-10 DIAGNOSIS — Z79899 Other long term (current) drug therapy: Secondary | ICD-10-CM | POA: Diagnosis not present

## 2021-05-10 DIAGNOSIS — I120 Hypertensive chronic kidney disease with stage 5 chronic kidney disease or end stage renal disease: Secondary | ICD-10-CM | POA: Diagnosis not present

## 2021-05-10 DIAGNOSIS — N186 End stage renal disease: Secondary | ICD-10-CM | POA: Diagnosis not present

## 2021-05-10 DIAGNOSIS — N185 Chronic kidney disease, stage 5: Secondary | ICD-10-CM | POA: Diagnosis not present

## 2021-05-10 HISTORY — DX: Chronic obstructive pulmonary disease, unspecified: J44.9

## 2021-05-10 HISTORY — PX: AV FISTULA PLACEMENT: SHX1204

## 2021-05-10 HISTORY — DX: Iron deficiency: E61.1

## 2021-05-10 HISTORY — DX: Personal history of other medical treatment: Z92.89

## 2021-05-10 LAB — POCT I-STAT, CHEM 8
BUN: 50 mg/dL — ABNORMAL HIGH (ref 8–23)
Calcium, Ion: 1.32 mmol/L (ref 1.15–1.40)
Chloride: 114 mmol/L — ABNORMAL HIGH (ref 98–111)
Creatinine, Ser: 4.5 mg/dL — ABNORMAL HIGH (ref 0.61–1.24)
Glucose, Bld: 127 mg/dL — ABNORMAL HIGH (ref 70–99)
HCT: 30 % — ABNORMAL LOW (ref 39.0–52.0)
Hemoglobin: 10.2 g/dL — ABNORMAL LOW (ref 13.0–17.0)
Potassium: 4.6 mmol/L (ref 3.5–5.1)
Sodium: 144 mmol/L (ref 135–145)
TCO2: 22 mmol/L (ref 22–32)

## 2021-05-10 LAB — GLUCOSE, CAPILLARY
Glucose-Capillary: 123 mg/dL — ABNORMAL HIGH (ref 70–99)
Glucose-Capillary: 115 mg/dL — ABNORMAL HIGH (ref 70–99)

## 2021-05-10 SURGERY — ARTERIOVENOUS (AV) FISTULA CREATION
Anesthesia: Monitor Anesthesia Care | Laterality: Left

## 2021-05-10 MED ORDER — LIDOCAINE HCL (PF) 1 % IJ SOLN
INTRAMUSCULAR | Status: AC
  Filled 2021-05-10: qty 30

## 2021-05-10 MED ORDER — HEPARIN SODIUM (PORCINE) 1000 UNIT/ML IJ SOLN
INTRAMUSCULAR | Status: DC | PRN
  Administered 2021-05-10: 5000 [IU] via INTRAVENOUS

## 2021-05-10 MED ORDER — LIDOCAINE HCL (PF) 1 % IJ SOLN
INTRAMUSCULAR | Status: DC | PRN
  Administered 2021-05-10: 8 mL

## 2021-05-10 MED ORDER — PROPOFOL 500 MG/50ML IV EMUL
INTRAVENOUS | Status: DC | PRN
  Administered 2021-05-10: 75 ug/kg/min via INTRAVENOUS

## 2021-05-10 MED ORDER — ACETAMINOPHEN 10 MG/ML IV SOLN: 1000.0000 mg | Freq: Once | INTRAVENOUS | Status: DC | PRN

## 2021-05-10 MED ORDER — FENTANYL CITRATE (PF) 250 MCG/5ML IJ SOLN
INTRAMUSCULAR | Status: AC
  Filled 2021-05-10: qty 5

## 2021-05-10 MED ORDER — SODIUM CHLORIDE 0.9 % IV SOLN
INTRAVENOUS | Status: AC
  Filled 2021-05-10: qty 1.2

## 2021-05-10 MED ORDER — CHLORHEXIDINE GLUCONATE 4 % EX LIQD: 60.0000 mL | Freq: Once | CUTANEOUS | Status: DC

## 2021-05-10 MED ORDER — CEFAZOLIN SODIUM-DEXTROSE 2-4 GM/100ML-% IV SOLN
2.0000 g | INTRAVENOUS | Status: AC
  Administered 2021-05-10: 2 g via INTRAVENOUS
  Filled 2021-05-10: qty 100

## 2021-05-10 MED ORDER — PHENYLEPHRINE 40 MCG/ML (10ML) SYRINGE FOR IV PUSH (FOR BLOOD PRESSURE SUPPORT)
PREFILLED_SYRINGE | INTRAVENOUS | Status: DC | PRN
  Administered 2021-05-10 (×3): 80 ug via INTRAVENOUS

## 2021-05-10 MED ORDER — OXYCODONE-ACETAMINOPHEN 5-325 MG PO TABS: 1.0000 | ORAL_TABLET | Freq: Four times a day (QID) | ORAL | 0 refills | Status: DC | PRN

## 2021-05-10 MED ORDER — FENTANYL CITRATE (PF) 100 MCG/2ML IJ SOLN: 25.0000 ug | INTRAMUSCULAR | Status: DC | PRN

## 2021-05-10 MED ORDER — SODIUM CHLORIDE 0.9 % IV SOLN: INTRAVENOUS | Status: DC

## 2021-05-10 MED ORDER — OXYCODONE HCL 5 MG/5ML PO SOLN: 5.0000 mg | Freq: Once | ORAL | Status: DC | PRN

## 2021-05-10 MED ORDER — ACETAMINOPHEN 500 MG PO TABS: 1000.0000 mg | ORAL_TABLET | Freq: Once | ORAL | Status: DC | PRN

## 2021-05-10 MED ORDER — PROPOFOL 10 MG/ML IV BOLUS
INTRAVENOUS | Status: AC
  Filled 2021-05-10: qty 40

## 2021-05-10 MED ORDER — 0.9 % SODIUM CHLORIDE (POUR BTL) OPTIME
TOPICAL | Status: DC | PRN
  Administered 2021-05-10: 1000 mL

## 2021-05-10 MED ORDER — OXYCODONE HCL 5 MG PO TABS: 5.0000 mg | ORAL_TABLET | Freq: Once | ORAL | Status: DC | PRN

## 2021-05-10 MED ORDER — HEPARIN SODIUM (PORCINE) 1000 UNIT/ML IJ SOLN
INTRAMUSCULAR | Status: AC
  Filled 2021-05-10: qty 1

## 2021-05-10 MED ORDER — MIDAZOLAM HCL 2 MG/2ML IJ SOLN
INTRAMUSCULAR | Status: AC
  Filled 2021-05-10: qty 2

## 2021-05-10 MED ORDER — SODIUM CHLORIDE 0.9 % IV SOLN: INTRAVENOUS | Status: DC | PRN

## 2021-05-10 MED ORDER — MIDAZOLAM HCL 2 MG/2ML IJ SOLN
INTRAMUSCULAR | Status: DC | PRN
  Administered 2021-05-10: 2 mg via INTRAVENOUS

## 2021-05-10 MED ORDER — PHENYLEPHRINE 40 MCG/ML (10ML) SYRINGE FOR IV PUSH (FOR BLOOD PRESSURE SUPPORT)
PREFILLED_SYRINGE | INTRAVENOUS | Status: AC
  Filled 2021-05-10: qty 10

## 2021-05-10 MED ORDER — FENTANYL CITRATE (PF) 250 MCG/5ML IJ SOLN
INTRAMUSCULAR | Status: DC | PRN
  Administered 2021-05-10 (×2): 50 ug via INTRAVENOUS

## 2021-05-10 MED ORDER — CHLORHEXIDINE GLUCONATE 0.12 % MT SOLN
OROMUCOSAL | Status: AC
  Administered 2021-05-10: 15 mL
  Filled 2021-05-10: qty 15

## 2021-05-10 MED ORDER — PHENYLEPHRINE HCL-NACL 10-0.9 MG/250ML-% IV SOLN
INTRAVENOUS | Status: DC | PRN
  Administered 2021-05-10: 25 ug/min via INTRAVENOUS

## 2021-05-10 MED ORDER — ACETAMINOPHEN 160 MG/5ML PO SOLN: 1000.0000 mg | Freq: Once | ORAL | Status: DC | PRN

## 2021-05-10 SURGICAL SUPPLY — 34 items
ARMBAND PINK RESTRICT EXTREMIT (MISCELLANEOUS) ×2 IMPLANT
CANISTER SUCT 3000ML PPV (MISCELLANEOUS) ×2 IMPLANT
CANNULA VESSEL 3MM 2 BLNT TIP (CANNULA) ×2 IMPLANT
CLIP VESOCCLUDE MED 6/CT (CLIP) ×2 IMPLANT
CLIP VESOCCLUDE SM WIDE 6/CT (CLIP) ×2 IMPLANT
COVER PROBE W GEL 5X96 (DRAPES) ×2 IMPLANT
COVER WAND RF STERILE (DRAPES) IMPLANT
DECANTER SPIKE VIAL GLASS SM (MISCELLANEOUS) ×2 IMPLANT
DERMABOND ADVANCED (GAUZE/BANDAGES/DRESSINGS) ×1
DERMABOND ADVANCED .7 DNX12 (GAUZE/BANDAGES/DRESSINGS) ×1 IMPLANT
DRAIN PENROSE 1/4X12 LTX STRL (WOUND CARE) ×2 IMPLANT
ELECT REM PT RETURN 9FT ADLT (ELECTROSURGICAL) ×2
ELECTRODE REM PT RTRN 9FT ADLT (ELECTROSURGICAL) ×1 IMPLANT
GLOVE SURG ENC MOIS LTX SZ7.5 (GLOVE) ×4 IMPLANT
GOWN STRL REUS W/ TWL LRG LVL3 (GOWN DISPOSABLE) ×3 IMPLANT
GOWN STRL REUS W/TWL LRG LVL3 (GOWN DISPOSABLE) ×6
KIT BASIN OR (CUSTOM PROCEDURE TRAY) ×2 IMPLANT
KIT TURNOVER KIT B (KITS) ×2 IMPLANT
LOOP VESSEL MINI RED (MISCELLANEOUS) IMPLANT
NS IRRIG 1000ML POUR BTL (IV SOLUTION) ×2 IMPLANT
PACK CV ACCESS (CUSTOM PROCEDURE TRAY) ×2 IMPLANT
PAD ARMBOARD 7.5X6 YLW CONV (MISCELLANEOUS) ×4 IMPLANT
SLING ARM FOAM STRAP LRG (SOFTGOODS) ×2 IMPLANT
SPONGE SURGIFOAM ABS GEL 100 (HEMOSTASIS) IMPLANT
SUT PROLENE 6 0 CC (SUTURE) ×2 IMPLANT
SUT PROLENE 7 0 BV 1 (SUTURE) IMPLANT
SUT SILK 3 0 (SUTURE) ×2
SUT SILK 3-0 18XBRD TIE 12 (SUTURE) ×1 IMPLANT
SUT VIC AB 3-0 SH 27 (SUTURE) ×2
SUT VIC AB 3-0 SH 27X BRD (SUTURE) ×1 IMPLANT
SUT VICRYL 4-0 PS2 18IN ABS (SUTURE) ×2 IMPLANT
TOWEL GREEN STERILE (TOWEL DISPOSABLE) ×2 IMPLANT
UNDERPAD 30X36 HEAVY ABSORB (UNDERPADS AND DIAPERS) ×2 IMPLANT
WATER STERILE IRR 1000ML POUR (IV SOLUTION) ×2 IMPLANT

## 2021-05-10 NOTE — Anesthesia Preprocedure Evaluation (Signed)
Anesthesia Evaluation  Patient identified by MRN, date of birth, ID band Patient awake    Reviewed: Allergy & Precautions, NPO status , Patient's Chart, lab work & pertinent test results, reviewed documented beta blocker date and time   History of Anesthesia Complications Negative for: history of anesthetic complications  Airway Mallampati: III  TM Distance: >3 FB Neck ROM: Full    Dental  (+) Edentulous Upper, Edentulous Lower   Pulmonary shortness of breath, neg sleep apnea, COPD,  COPD inhaler, neg recent URI, former smoker,  Covid-19 Nucleic Acid Test Results Lab Results      Component                Value               Date                      SARSCOV2NAA              NEGATIVE            09/26/2020                [place]              NEGATIVE            03/23/2020              breath sounds clear to auscultation       Cardiovascular hypertension, Pt. on medications and Pt. on home beta blockers (-) angina+ Peripheral Vascular Disease  (-) Past MI and (-) CHF  Rhythm:Regular     Neuro/Psych negative neurological ROS  negative psych ROS   GI/Hepatic Neg liver ROS, GERD  ,  Endo/Other  diabetesNo results found for: HGBA1C   Renal/GU CRFRenal disease     Musculoskeletal   Abdominal   Peds  Hematology  (+) Blood dyscrasia, anemia , Lab Results      Component                Value               Date                      WBC                      8.6                 11/26/2020                HGB                      10.2 (L)            05/10/2021                HCT                      30.0 (L)            05/10/2021                MCV                      101.7 (H)           11/26/2020                PLT  161                 11/26/2020              Anesthesia Other Findings   Reproductive/Obstetrics                             Anesthesia Physical Anesthesia  Plan  ASA: 3  Anesthesia Plan: MAC and Regional   Post-op Pain Management:    Induction:   PONV Risk Score and Plan: 1 and Propofol infusion and Treatment may vary due to age or medical condition  Airway Management Planned: Nasal Cannula  Additional Equipment: None  Intra-op Plan:   Post-operative Plan:   Informed Consent: I have reviewed the patients History and Physical, chart, labs and discussed the procedure including the risks, benefits and alternatives for the proposed anesthesia with the patient or authorized representative who has indicated his/her understanding and acceptance.     Dental advisory given  Plan Discussed with: CRNA and Surgeon  Anesthesia Plan Comments:         Anesthesia Quick Evaluation

## 2021-05-10 NOTE — Interval H&P Note (Signed)
History and Physical Interval Note:  05/10/2021 7:32 AM  Taylor King  has presented today for surgery, with the diagnosis of END STAGE RENAL DISEASE.  The various methods of treatment have been discussed with the patient and family. After consideration of risks, benefits and other options for treatment, the patient has consented to  Procedure(s): LEFT BRACHIOCEPHALIC ARTERIOVENOUS (AV) FISTULA CREATION (Left) as a surgical intervention.  The patient's history has been reviewed, patient examined, no change in status, stable for surgery.  I have reviewed the patient's chart and labs.  Questions were answered to the patient's satisfaction.     Ruta Hinds

## 2021-05-10 NOTE — Transfer of Care (Signed)
Immediate Anesthesia Transfer of Care Note  Patient: Taylor King  Procedure(s) Performed: LEFT BRACHIOCEPHALIC ARTERIOVENOUS (AV) FISTULA CREATION (Left)  Patient Location: PACU  Anesthesia Type:MAC combined with regional for post-op pain  Level of Consciousness: awake, alert  and oriented  Airway & Oxygen Therapy: Patient Spontanous Breathing  Post-op Assessment: Report given to RN and Post -op Vital signs reviewed and stable  Post vital signs: Reviewed and stable  Last Vitals:  Vitals Value Taken Time  BP 143/80 05/10/21 0906  Temp    Pulse 62 05/10/21 0906  Resp 18 05/10/21 0906  SpO2 100 % 05/10/21 0906  Vitals shown include unvalidated device data.  Last Pain:  Vitals:   05/10/21 0628  TempSrc:   PainSc: 0-No pain         Complications: No notable events documented.

## 2021-05-10 NOTE — Discharge Instructions (Signed)
° °  [facility] ° °Discharge Instructions ° °AV Fistula or Graft Surgery for Dialysis Access ° °Please refer to the following instructions for your post-procedure care. Your surgeon or physician assistant will discuss any changes with you. ° °Activity ° °You may drive the day following your surgery, if you are comfortable and no longer taking prescription pain medication. Resume full activity as the soreness in your incision resolves. ° °Bathing/Showering ° °You may shower after you go home. Keep your incision dry for 48 hours. Do not soak in a bathtub, hot tub, or swim until the incision heals completely. You may not shower if you have a hemodialysis catheter. ° °Incision Care ° °Clean your incision with mild soap and water after 48 hours. Pat the area dry with a clean towel. You do not need a bandage unless otherwise instructed. Do not apply any ointments or creams to your incision. You may have skin glue on your incision. Do not peel it off. It will come off on its own in about one week. Your arm may swell a bit after surgery. To reduce swelling use pillows to elevate your arm so it is above your heart. Your doctor will tell you if you need to lightly wrap your arm with an ACE bandage. ° °Diet ° °Resume your normal diet. There are not special food restrictions following this procedure. In order to heal from your surgery, it is CRITICAL to get adequate nutrition. Your body requires vitamins, minerals, and protein. Vegetables are the best source of vitamins and minerals. Vegetables also provide the perfect balance of protein. Processed food has little nutritional value, so try to avoid this. ° °Medications ° °Resume taking all of your medications. If your incision is causing pain, you may take over-the counter pain relievers such as acetaminophen (Tylenol). If you were prescribed a stronger pain medication, please be aware these medications can cause nausea and constipation. Prevent  nausea by taking the medication with a snack or meal. Avoid constipation by drinking plenty of fluids and eating foods with high amount of fiber, such as fruits, vegetables, and grains. Do not take Tylenol if you are taking prescription pain medications. ° ° ° ° °Follow up °Your surgeon may want to see you in the office following your access surgery. If so, this will be arranged at the time of your surgery. ° °Please call us immediately for any of the following conditions: ° °Increased pain, redness, drainage (pus) from your incision site °Fever of 101 degrees or higher °Severe or worsening pain at your incision site °Hand pain or numbness. ° °Reduce your risk of vascular disease: ° °Stop smoking. If you would like help, call [facility] at 1-800-QUIT-NOW (1-800-784-8669) or [facility] at 336-586-4000 ° °Manage your cholesterol °Maintain a desired weight °Control your diabetes °Keep your blood pressure down ° °Dialysis ° °It will take several weeks to several months for your new dialysis access to be ready for use. Your surgeon will determine when it is OK to use it. Your nephrologist will continue to direct your dialysis. You can continue to use your Permcath until your new access is ready for use. ° °If you have any questions, please call the office at [phone]. ° °

## 2021-05-10 NOTE — Op Note (Signed)
Procedure: Left brachiocephalic AV fistula  Preoperative diagnosis: CKD 5  Postoperative diagnosis: Same  Anesthesia: Left upper extremity regional block, local anesthesia  Operative findings: 3 mm left cephalic vein  Assistant: Arlee Muslim, PA-C to expedite procedure and create anastomosis  Operative details: After obtaining informed consent, the patient was taken the operating.  The patient was placed in supine position on the operating table.  An axillary block had been placed by the anesthesia team prior to the patient arrived in the operating room.  The patient's left upper extremities prepped and draped in usual sterile fashion.  Local anesthesia was infiltrated near the antecubital crease.  Ultrasound was used to identify the course of the left cephalic vein.  It was about 3 mm in diameter.  A transverse incision was made in the antecubital region carried down through the subcutaneous tissues down the level of the cephalic vein.  It was dissected free circumferentially.  It was of good quality.  All sidebranches were ligated and divided tween silk ties.  Next the brachial artery was dissected free in the medial portion incision.  It was also about 3 mm in diameter.  Vesseloops were placed proximal distal to the planned site of arteriotomy.  The patient was given 5000 notes of intravenous heparin.  After 2 minutes circulation time, and longitudinal opening was made in the brachial artery with control with Vesseloops.  The distal cephalic vein was ligated with a 2-0 silk tie transected and swung over to the level of the artery.  The vein was then sewn end of vein to side of artery using a running 6-0 Prolene suture.  Just prior to completion of anastomosis it was for blood backbled and thoroughly flushed.  No stenosis was secured Vesseloops released there was palpable thrill in fistula immediately.  Hemostasis was obtained.  The subcutaneous tissues were reapproximated using a running 3-0 Vicryl  suture.  The skin was closed with 4-0 Vicryl subcuticular stitch.  Dermabond was applied.  The patient tolerated procedure well and there were no complications.  Instrument sponge and needle count was correct at the end of the case.  Patient is taken to recovery in stable condition.  Ruta Hinds, MD [facility] Office: [phone]

## 2021-05-10 NOTE — Anesthesia Procedure Notes (Signed)
Procedure Name: MAC Date/Time: 05/10/2021 7:40 AM Performed by: Fulton Reek, CRNA Pre-anesthesia Checklist: Patient identified, Suction available, Patient being monitored and Emergency Drugs available Patient Re-evaluated:Patient Re-evaluated prior to induction Oxygen Delivery Method: Simple face mask Dental Injury: Teeth and Oropharynx as per pre-operative assessment

## 2021-05-11 ENCOUNTER — Encounter (HOSPITAL_COMMUNITY): Payer: Self-pay | Admitting: Vascular Surgery

## 2021-05-19 ENCOUNTER — Encounter (HOSPITAL_COMMUNITY)
Admission: RE | Admit: 2021-05-19 | Discharge: 2021-05-19 | Disposition: A | Discharge status: Home / Self Care | Payer: PPO | Source: Ambulatory Visit | Attending: Nephrology | Admitting: Nephrology

## 2021-05-19 ENCOUNTER — Encounter (HOSPITAL_COMMUNITY): Payer: Self-pay | Admitting: Vascular Surgery

## 2021-05-19 ENCOUNTER — Other Ambulatory Visit: Payer: Self-pay

## 2021-05-19 VITALS — BP 130/71 | HR 60 | Temp 97.8°F

## 2021-05-19 DIAGNOSIS — N189 Chronic kidney disease, unspecified: Secondary | ICD-10-CM | POA: Insufficient documentation

## 2021-05-19 DIAGNOSIS — N179 Acute kidney failure, unspecified: Secondary | ICD-10-CM | POA: Diagnosis not present

## 2021-05-19 DIAGNOSIS — D631 Anemia in chronic kidney disease: Secondary | ICD-10-CM | POA: Diagnosis not present

## 2021-05-19 LAB — IRON AND TIBC
Iron: 80 ug/dL (ref 45–182)
Saturation Ratios: 29 % (ref 17.9–39.5)
TIBC: 273 ug/dL (ref 250–450)
UIBC: 193 ug/dL

## 2021-05-19 LAB — FERRITIN: Ferritin: 427 ng/mL — ABNORMAL HIGH (ref 24–336)

## 2021-05-19 LAB — POCT HEMOGLOBIN-HEMACUE: Hemoglobin: 11.4 g/dL — ABNORMAL LOW (ref 13.0–17.0)

## 2021-05-19 MED ORDER — EPOETIN ALFA-EPBX 40000 UNIT/ML IJ SOLN
INTRAMUSCULAR | Status: AC
  Filled 2021-05-19: qty 1

## 2021-05-19 MED ORDER — BUPIVACAINE-EPINEPHRINE (PF) 0.5% -1:200000 IJ SOLN
INTRAMUSCULAR | Status: DC | PRN
  Administered 2021-05-10: 20 mL

## 2021-05-19 MED ORDER — LIDOCAINE-EPINEPHRINE (PF) 1.5 %-1:200000 IJ SOLN
INTRAMUSCULAR | Status: DC | PRN
  Administered 2021-05-10: 15 mL via PERINEURAL

## 2021-05-19 MED ORDER — EPOETIN ALFA-EPBX 40000 UNIT/ML IJ SOLN
30000.0000 [IU] | INTRAMUSCULAR | Status: DC
Start: 2021-05-19 — End: 2021-05-20
  Administered 2021-05-19: 30000 [IU] via SUBCUTANEOUS

## 2021-05-19 NOTE — Anesthesia Postprocedure Evaluation (Signed)
Anesthesia Post Note  Patient: Taylor King  Procedure(s) Performed: LEFT BRACHIOCEPHALIC ARTERIOVENOUS (AV) FISTULA CREATION (Left)     Patient location during evaluation: PACU Anesthesia Type: Regional and MAC Level of consciousness: awake and alert Pain management: pain level controlled Vital Signs Assessment: post-procedure vital signs reviewed and stable Respiratory status: spontaneous breathing, nonlabored ventilation, respiratory function stable and patient connected to nasal cannula oxygen Cardiovascular status: stable and blood pressure returned to baseline Postop Assessment: no apparent nausea or vomiting Anesthetic complications: no   No notable events documented.  Last Vitals:  Vitals:   05/10/21 0921 05/10/21 0936  BP: (!) 151/80 (!) 157/87  Pulse: 63 91  Resp: 15 19  Temp:  36.4 C  SpO2: 99% 98%    Last Pain:  Vitals:   05/10/21 0936  TempSrc:   PainSc: 0-No pain                 Mozella Rexrode

## 2021-05-19 NOTE — Anesthesia Procedure Notes (Signed)
Anesthesia Regional Block: Axillary brachial plexus block   Pre-Anesthetic Checklist: , timeout performed,  Correct Patient, Correct Site, Correct Laterality,  Correct Procedure, Correct Position, site marked,  Risks and benefits discussed,  Surgical consent,  Pre-op evaluation,  At surgeon's request and post-op pain management  Laterality: Upper and Left  Prep: chloraprep       Needles:  Injection technique: Single-shot      Needle Length: 9cm  Needle Gauge: 22     Additional Needles: Arrow StimuQuik ECHO Echogenic Stimulating PNB Needle  Procedures:,,,, ultrasound used (permanent image in chart),,    Narrative:  Start time: 05/10/2021 6:58 AM End time: 05/10/2021 7:06 AM Injection made incrementally with aspirations every 5 mL.  Performed by: Personally  Anesthesiologist: Oleta Mouse, MD

## 2021-05-24 ENCOUNTER — Ambulatory Visit: Payer: PPO | Admitting: Neurology

## 2021-05-24 DIAGNOSIS — N185 Chronic kidney disease, stage 5: Secondary | ICD-10-CM | POA: Diagnosis not present

## 2021-05-25 DIAGNOSIS — R1310 Dysphagia, unspecified: Secondary | ICD-10-CM | POA: Diagnosis not present

## 2021-05-25 DIAGNOSIS — K2100 Gastro-esophageal reflux disease with esophagitis, without bleeding: Secondary | ICD-10-CM | POA: Diagnosis not present

## 2021-05-25 DIAGNOSIS — K2930 Chronic superficial gastritis without bleeding: Secondary | ICD-10-CM | POA: Diagnosis not present

## 2021-05-25 DIAGNOSIS — R12 Heartburn: Secondary | ICD-10-CM | POA: Diagnosis not present

## 2021-05-25 DIAGNOSIS — K219 Gastro-esophageal reflux disease without esophagitis: Secondary | ICD-10-CM | POA: Diagnosis not present

## 2021-05-26 ENCOUNTER — Encounter: Payer: Self-pay | Admitting: Neurology

## 2021-05-26 ENCOUNTER — Ambulatory Visit: Payer: PPO | Admitting: Neurology

## 2021-05-26 VITALS — BP 135/74 | HR 57 | Ht 69.0 in | Wt 181.0 lb

## 2021-05-26 DIAGNOSIS — M25579 Pain in unspecified ankle and joints of unspecified foot: Secondary | ICD-10-CM | POA: Diagnosis not present

## 2021-05-26 NOTE — Progress Notes (Signed)
Chief Complaint  Patient presents with   Follow-up    Rm 54, with wife, states PCP sent referral for pain in both hands, states it may be arthritis, has gotten worse in the last 4 months       ASSESSMENT AND PLAN  Taylor King is a 70 y.o. male   Joint pain  Involving right thumb, knuckles, and left ankle,  Otherwise normal neurological examination, no evidence of peripheral neuropathy, or intrinsic muscle disease  Chronic kidney failure, status post AV fistula  His complains of joint pain is most musculoskeletal, continue to work with his primary care physician, I have suggested Voltaren gel as needed, warm compression,  No need for neurological further evaluation  DIAGNOSTIC DATA (LABS, IMAGING, TESTING) - I reviewed patient records, labs, notes, testing and imaging myself where available.  Labs April 2022, LDL 114, normal TSH 2.11, CMP, creat 4.67, GFR 13  HISTORICAL  Taylor King, is a 70 year old male seen in request by his primary care physician Dr. Delfina Redwood, Jori Moll, for evaluation of hands joints pain, initial evaluation was with his wife on May 26, 2021   I reviewed and summarized the referring note. PMHx. Chronic kidney disease, in Nov 2021.  Glomerulonephritis, S/p AV fistula  COPD,  Steroid DM, was treated transiently with insulin Kidney stone Peripheral vascular disease. Anemia due to chronic kidney disease. Lumbar decompression L2-3, left lumbar radiculopathy, in May 2021. Surgery did help his low back pain.  History of left knee replacement.  He had acute worsening of kidney function since November 2021, was diagnosed with glomerulonephritis, recently received left AV fistula,  Complains more than a decade of right thumb pain, intermittent, flareup sometimes, at the base of right thumb interphalangeal joints, had a recent flareup, but no limitation in his daily function, occasionally other knuckle pain, recent onset of left ankle pain,  He denies  significant neck pain, no bilateral fingertips or toes paresthesia, mild gait limitation due to left ankle pain  Reviewed laboratory evaluations in April 2022, abnormal creatinine with GFR of 13, elevated LDL of 114   PHYSICAL EXAM:   Vitals:   05/26/21 1357  BP: 135/74  Pulse: (!) 57  Weight: 181 lb (82.1 kg)  Height: 5\' 9"  (1.753 m)   Not recorded     Body mass index is 26.73 kg/m.  PHYSICAL EXAMNIATION:  Gen: NAD, conversant, well nourised, well groomed                     Cardiovascular: Regular rate rhythm, no peripheral edema, warm, nontender. Eyes: Conjunctivae clear without exudates or hemorrhage Neck: Supple, no carotid bruits. Pulmonary: Clear to auscultation bilaterally   NEUROLOGICAL EXAM:  MENTAL STATUS: Depressed looking middle-aged male Speech:    Speech is normal; fluent and spontaneous with normal comprehension.  Cognition:     Orientation to time, place and person     Normal recent and remote memory     Normal Attention span and concentration     Normal Language, naming, repeating,spontaneous speech     Fund of knowledge   CRANIAL NERVES: CN II: Visual fields are full to confrontation. Pupils are round equal and briskly reactive to light. CN III, IV, VI: extraocular movement are normal. No ptosis. CN V: Facial sensation is intact to light touch CN VII: Face is symmetric with normal eye closure  CN VIII: Hearing is normal to causal conversation. CN IX, X: Phonation is normal. CN XI: Head turning and shoulder shrug  are intact  MOTOR: There is no pronator drift of out-stretched arms. Muscle bulk and tone are normal. Muscle strength is normal.  Tenderness of right thumb at the base of metaphalangeal and interphalangeal joints, mild left lateral ankle pain  REFLEXES: Reflexes are 2+ and symmetric at the biceps, triceps, knees, and ankles. Plantar responses are flexor.  SENSORY: Intact to light touch, pinprick and vibratory sensation are intact in  fingers and toes.  COORDINATION: There is no trunk or limb dysmetria noted.  GAIT/STANCE: He can get up from seated position arm crossed, mildly antalgic,  REVIEW OF SYSTEMS:  Full 14 system review of systems performed and notable only for as above All other review of systems were negative.   ALLERGIES: No Known Allergies  HOME MEDICATIONS: Current Outpatient Medications  Medication Sig Dispense Refill   albuterol (VENTOLIN HFA) 108 (90 Base) MCG/ACT inhaler Inhale 2 puffs into the lungs every 6 (six) hours as needed for wheezing or shortness of breath. 8 g 6   atenolol (TENORMIN) 50 MG tablet Take 50 mg by mouth daily.     Cholecalciferol (VITAMIN D) 50 MCG (2000 UT) CAPS Take 2,000 Units by mouth daily.     oxyCODONE-acetaminophen (PERCOCET) 5-325 MG tablet Take 1 tablet by mouth every 6 (six) hours as needed for severe pain. 10 tablet 0   Tiotropium Bromide Monohydrate (SPIRIVA RESPIMAT) 2.5 MCG/ACT AERS Inhale 2 puffs into the lungs daily. 4 g 6   vitamin B-12 (CYANOCOBALAMIN) 1000 MCG tablet Take 1,000 mcg by mouth daily.     No current facility-administered medications for this visit.    PAST MEDICAL HISTORY: Past Medical History:  Diagnosis Date   Arthritis    Chronic back pain    Chronic kidney disease    COPD (chronic obstructive pulmonary disease) (HCC)    Diabetes mellitus without complication (HCC)    no meds currently   Diverticulitis    Dyspnea    with exertion   GERD (gastroesophageal reflux disease)    occ -no meds, diet controlled   History of blood transfusion    History of kidney stones    passed stones - no surgery   Iron deficiency    blood transfusions in 09/2020 and 11/2020 - currently getting iron fusions EOW   Peripheral vascular disease ([place])    aaa 39 mm 02-14-17 epic [place]    PAST SURGICAL HISTORY: Past Surgical History:  Procedure Laterality Date   adenoids removed   age 85   ANTERIOR LAT LUMBAR FUSION N/A 03/26/2020   Procedure: ANTERIOR  LATERAL LUMBAR FUSION (XLIF) LUMBAR TWO-THREE WITH POSTERIOR SPINAL FUSION INTERBODY LUMBAR TWO-THREE;  Surgeon: Melina Schools, MD;  Location: [facility];  Service: Orthopedics;  Laterality: N/A;  4 hrs Left tap block with exparel   AV FISTULA PLACEMENT Left 05/10/2021   Procedure: LEFT BRACHIOCEPHALIC ARTERIOVENOUS (AV) FISTULA CREATION;  Surgeon: Elam Dutch, MD;  Location: [place];  Service: Vascular;  Laterality: Left;   COLONOSCOPY WITH PROPOFOL N/A 03/07/2017   Procedure: COLONOSCOPY WITH PROPOFOL;  Surgeon: Garlan Fair, MD;  Location: [facility];  Service: Endoscopy;  Laterality: N/A;   colonscopy     x 3   KNEE ARTHROSCOPY  [id number]   left knee   TONSILLECTOMY  age 3   TOTAL KNEE ARTHROPLASTY  11/25/2011   Procedure: TOTAL KNEE ARTHROPLASTY;  Surgeon: Newt Minion, MD;  Location: [place] [facility];  Service: Orthopedics;  Laterality: Left;  Left Total Knee Arthroplasty    FAMILY HISTORY: Family  History  Problem Relation Age of Onset   Emphysema Father     SOCIAL HISTORY: Social History   Socioeconomic History   Marital status: Married    Spouse name: Not on file   Number of children: Not on file   Years of education: Not on file   Highest education level: Not on file  Occupational History   Not on file  Tobacco Use   Smoking status: Former    Packs/day: 0.25    Years: 30.00    Pack years: 7.50    Types: Cigarettes    Quit date: 12/25/2013    Years since quitting: 7.4   Smokeless tobacco: Never  Vaping Use   Vaping Use: Never used  Substance and Sexual Activity   Alcohol use: No   Drug use: No   Sexual activity: Not on file  Other Topics Concern   Not on file  Social History Narrative   Not on file   Social Determinants of Health   Financial Resource Strain: Not on file  Food Insecurity: Not on file  Transportation Needs: Not on file  Physical Activity: Not on file  Stress: Not on file  Social Connections: Not on file  Intimate Partner Violence: Not on file       Marcial Pacas, M.D. Ph.D.  [facility] [address], [place], [place] [address] Ph: [phone] Fax: [phone]  CC:  Seward Carol, MD [address] [place],  [place] [address]  Seward Carol, MD

## 2021-05-31 DIAGNOSIS — Z7984 Long term (current) use of oral hypoglycemic drugs: Secondary | ICD-10-CM | POA: Diagnosis not present

## 2021-05-31 DIAGNOSIS — E1165 Type 2 diabetes mellitus with hyperglycemia: Secondary | ICD-10-CM | POA: Diagnosis not present

## 2021-05-31 DIAGNOSIS — K219 Gastro-esophageal reflux disease without esophagitis: Secondary | ICD-10-CM | POA: Diagnosis not present

## 2021-05-31 DIAGNOSIS — T380X5S Adverse effect of glucocorticoids and synthetic analogues, sequela: Secondary | ICD-10-CM | POA: Diagnosis not present

## 2021-05-31 DIAGNOSIS — K2930 Chronic superficial gastritis without bleeding: Secondary | ICD-10-CM | POA: Diagnosis not present

## 2021-06-01 DIAGNOSIS — N185 Chronic kidney disease, stage 5: Secondary | ICD-10-CM | POA: Diagnosis not present

## 2021-06-01 DIAGNOSIS — E875 Hyperkalemia: Secondary | ICD-10-CM | POA: Diagnosis not present

## 2021-06-01 DIAGNOSIS — N058 Unspecified nephritic syndrome with other morphologic changes: Secondary | ICD-10-CM | POA: Diagnosis not present

## 2021-06-01 DIAGNOSIS — E872 Acidosis: Secondary | ICD-10-CM | POA: Diagnosis not present

## 2021-06-01 DIAGNOSIS — I770 Arteriovenous fistula, acquired: Secondary | ICD-10-CM | POA: Diagnosis not present

## 2021-06-01 DIAGNOSIS — N057 Unspecified nephritic syndrome with diffuse crescentic glomerulonephritis: Secondary | ICD-10-CM | POA: Diagnosis not present

## 2021-06-01 DIAGNOSIS — N2581 Secondary hyperparathyroidism of renal origin: Secondary | ICD-10-CM | POA: Diagnosis not present

## 2021-06-01 DIAGNOSIS — I120 Hypertensive chronic kidney disease with stage 5 chronic kidney disease or end stage renal disease: Secondary | ICD-10-CM | POA: Diagnosis not present

## 2021-06-01 DIAGNOSIS — D631 Anemia in chronic kidney disease: Secondary | ICD-10-CM | POA: Diagnosis not present

## 2021-06-02 ENCOUNTER — Encounter (HOSPITAL_COMMUNITY)
Admission: RE | Admit: 2021-06-02 | Discharge: 2021-06-02 | Disposition: A | Discharge status: Home / Self Care | Payer: PPO | Source: Ambulatory Visit | Attending: Nephrology | Admitting: Nephrology

## 2021-06-02 ENCOUNTER — Other Ambulatory Visit: Payer: Self-pay

## 2021-06-02 ENCOUNTER — Encounter (HOSPITAL_COMMUNITY): Payer: PPO

## 2021-06-02 VITALS — BP 125/68 | HR 57 | Temp 97.6°F | Resp 18

## 2021-06-02 DIAGNOSIS — N179 Acute kidney failure, unspecified: Secondary | ICD-10-CM | POA: Diagnosis not present

## 2021-06-02 MED ORDER — EPOETIN ALFA-EPBX 40000 UNIT/ML IJ SOLN
30000.0000 [IU] | INTRAMUSCULAR | Status: DC
Start: 2021-06-02 — End: 2021-06-03
  Administered 2021-06-02: 30000 [IU] via SUBCUTANEOUS

## 2021-06-02 MED ORDER — EPOETIN ALFA-EPBX 40000 UNIT/ML IJ SOLN
INTRAMUSCULAR | Status: AC
  Filled 2021-06-02: qty 1

## 2021-06-03 LAB — POCT HEMOGLOBIN-HEMACUE: Hemoglobin: 11.6 g/dL — ABNORMAL LOW (ref 13.0–17.0)

## 2021-06-04 ENCOUNTER — Other Ambulatory Visit: Payer: Self-pay

## 2021-06-04 DIAGNOSIS — N185 Chronic kidney disease, stage 5: Secondary | ICD-10-CM

## 2021-06-10 ENCOUNTER — Ambulatory Visit (HOSPITAL_COMMUNITY)
Admission: RE | Admit: 2021-06-10 | Discharge: 2021-06-10 | Disposition: A | Discharge status: Home / Self Care | Payer: PPO | Source: Ambulatory Visit | Attending: Vascular Surgery | Admitting: Vascular Surgery

## 2021-06-10 ENCOUNTER — Other Ambulatory Visit: Payer: Self-pay

## 2021-06-10 ENCOUNTER — Ambulatory Visit (INDEPENDENT_AMBULATORY_CARE_PROVIDER_SITE_OTHER): Payer: PPO | Admitting: Physician Assistant

## 2021-06-10 VITALS — BP 133/69 | HR 58 | Temp 97.3°F | Resp 97 | Ht 69.0 in | Wt 182.4 lb

## 2021-06-10 DIAGNOSIS — N185 Chronic kidney disease, stage 5: Secondary | ICD-10-CM | POA: Insufficient documentation

## 2021-06-10 NOTE — Progress Notes (Signed)
POST OPERATIVE OFFICE NOTE    CC:  F/u for surgery  HPI:  This is a 70 y.o. male who is s/p left BC AVF on 05/10/2021 by Dr. Oneida Alar.    Pt states he does not have pain/numbness in the left hand.  He states that after surgery, he did have some achiness around the incision and in the upper arm.  He continues to have some of this in the upper arm but it has improved.    The pt is not on dialysis.  He is followed by [facility].   No Known Allergies  Current Outpatient Medications  Medication Sig Dispense Refill   albuterol (VENTOLIN HFA) 108 (90 Base) MCG/ACT inhaler Inhale 2 puffs into the lungs every 6 (six) hours as needed for wheezing or shortness of breath. 8 g 6   atenolol (TENORMIN) 50 MG tablet Take 50 mg by mouth daily.     BD PEN NEEDLE NANO 2ND GEN 32G X 4 MM MISC      Cholecalciferol (VITAMIN D) 50 MCG (2000 UT) CAPS Take 2,000 Units by mouth daily.     LOKELMA 10 g PACK packet Take by mouth.     omeprazole (PRILOSEC) 40 MG capsule Take 40 mg by mouth every morning.     oxyCODONE-acetaminophen (PERCOCET) 5-325 MG tablet Take 1 tablet by mouth every 6 (six) hours as needed for severe pain. 10 tablet 0   Tiotropium Bromide Monohydrate (SPIRIVA RESPIMAT) 2.5 MCG/ACT AERS Inhale 2 puffs into the lungs daily. 4 g 6   vitamin B-12 (CYANOCOBALAMIN) 1000 MCG tablet Take 1,000 mcg by mouth daily.     No current facility-administered medications for this visit.     ROS:  See HPI  Physical Exam:  Today's Vitals   06/10/21 1430  BP: 133/69  Pulse: (!) 58  Resp: (!) 97  Temp: (!) 97.3 F (36.3 C)  TempSrc: Temporal  Weight: 182 lb 6.4 oz (82.7 kg)  Height: 5\' 9"  (1.753 m)  PainSc: 2    Body mass index is 26.94 kg/m.   Incision:  healed; there is a spitting stitch present Extremities:   There is a faintly palpable left radial pulse.   Motor and sensory are in tact.   There is a thrill/bruit present.  The fistula/graft is easily palpable   Dialysis Duplex on  06/10/2021: +------------+----------+-------------+----------+--------+  OUTFLOW VEINPSV (cm/s)Diameter (cm)Depth (cm)Describe  +------------+----------+-------------+----------+--------+  Prox UA        191        0.44        0.36             +------------+----------+-------------+----------+--------+  Mid UA         165        0.58        0.32             +------------+----------+-------------+----------+--------+  Dist UA        162        0.56        0.33             +------------+----------+-------------+----------+--------+  AC Fossa       580        0.50        0.24             +------------+----------+-------------+----------+--------+   Assessment/Plan:  This is a 70 y.o. male who is s/p: left BC AVF on 05/10/2021 by Dr. Oneida Alar.    -the pt does not  have evidence of steal.  He does have some achiness in the upper arm since surgery and this has gotten some better.   -the fistula is maturing nicely distally but is slow to mature proximally with 0.44cm.  given he is not on HD yet, would not do fistulogram, will bring back in 6 weeks to repeat the duplex.   -If pt has a tunneled dialysis catheter and the access has been used successfully to the satisfaction of the dialysis center, the tunneled catheter can be scheduled to be removed at their discretion.   -discussed with pt that access does not last forever and will need intervention or even new access at some point.     Leontine Locket, [facility] [phone]  Clinic MD:  Oneida Alar

## 2021-06-16 ENCOUNTER — Other Ambulatory Visit: Payer: Self-pay

## 2021-06-16 ENCOUNTER — Encounter (HOSPITAL_COMMUNITY)
Admission: RE | Admit: 2021-06-16 | Discharge: 2021-06-16 | Disposition: A | Discharge status: Home / Self Care | Payer: PPO | Source: Ambulatory Visit | Attending: Nephrology | Admitting: Nephrology

## 2021-06-16 VITALS — BP 146/62 | HR 65 | Temp 98.3°F | Resp 18

## 2021-06-16 DIAGNOSIS — D631 Anemia in chronic kidney disease: Secondary | ICD-10-CM | POA: Insufficient documentation

## 2021-06-16 DIAGNOSIS — N185 Chronic kidney disease, stage 5: Secondary | ICD-10-CM

## 2021-06-16 DIAGNOSIS — N179 Acute kidney failure, unspecified: Secondary | ICD-10-CM | POA: Insufficient documentation

## 2021-06-16 LAB — IRON AND TIBC
Iron: 60 ug/dL (ref 45–182)
Saturation Ratios: 21 % (ref 17.9–39.5)
TIBC: 280 ug/dL (ref 250–450)
UIBC: 220 ug/dL

## 2021-06-16 LAB — FERRITIN: Ferritin: 418 ng/mL — ABNORMAL HIGH (ref 24–336)

## 2021-06-16 LAB — POCT HEMOGLOBIN-HEMACUE: Hemoglobin: 11.1 g/dL — ABNORMAL LOW (ref 13.0–17.0)

## 2021-06-16 MED ORDER — EPOETIN ALFA-EPBX 40000 UNIT/ML IJ SOLN
INTRAMUSCULAR | Status: AC
  Administered 2021-06-16: 30000 [IU] via SUBCUTANEOUS
  Filled 2021-06-16: qty 1

## 2021-06-16 MED ORDER — EPOETIN ALFA-EPBX 40000 UNIT/ML IJ SOLN: 30000.0000 [IU] | INTRAMUSCULAR | Status: DC

## 2021-06-30 ENCOUNTER — Encounter (HOSPITAL_COMMUNITY)
Admission: RE | Admit: 2021-06-30 | Discharge: 2021-06-30 | Disposition: A | Discharge status: Home / Self Care | Payer: PPO | Source: Ambulatory Visit | Attending: Nephrology | Admitting: Nephrology

## 2021-06-30 VITALS — BP 158/69 | HR 60 | Resp 18

## 2021-06-30 DIAGNOSIS — N179 Acute kidney failure, unspecified: Secondary | ICD-10-CM | POA: Diagnosis not present

## 2021-06-30 MED ORDER — EPOETIN ALFA-EPBX 40000 UNIT/ML IJ SOLN
INTRAMUSCULAR | Status: AC
  Filled 2021-06-30: qty 1

## 2021-06-30 MED ORDER — EPOETIN ALFA-EPBX 40000 UNIT/ML IJ SOLN
30000.0000 [IU] | INTRAMUSCULAR | Status: DC
  Administered 2021-06-30: 30000 [IU] via SUBCUTANEOUS

## 2021-07-01 LAB — POCT HEMOGLOBIN-HEMACUE: Hemoglobin: 11.3 g/dL — ABNORMAL LOW (ref 13.0–17.0)

## 2021-07-04 IMAGING — US US BIOPSY
1 series · 11 of 11 positions shown · non-contrast
Comparison: 09/17/2020

INDICATION: 61-year-old male with history of acute kidney injury.

EXAM:
ULTRASOUND GUIDED RENAL BIOPSY

[Series 1: us biopsy (kidney) · 11 of 11 slices shown]
[im 1/11]
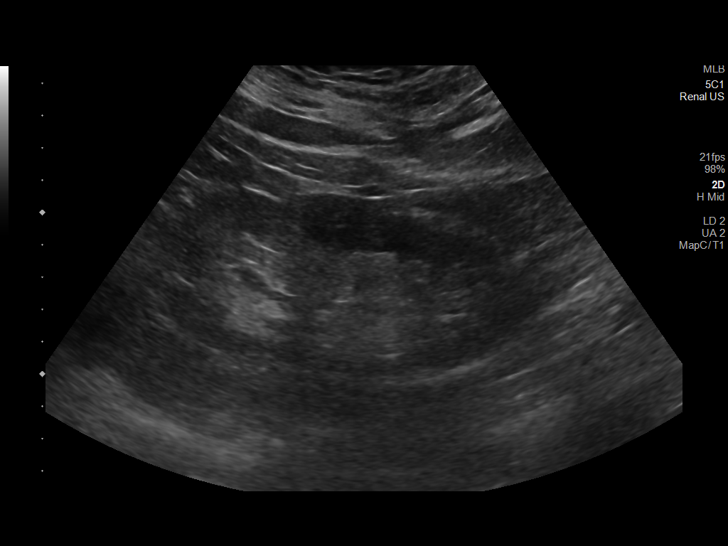
[im 2/11]
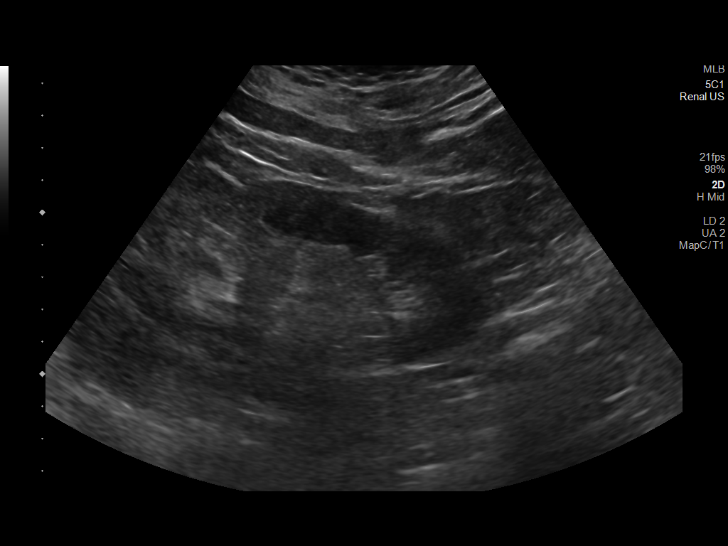
[im 3/11]
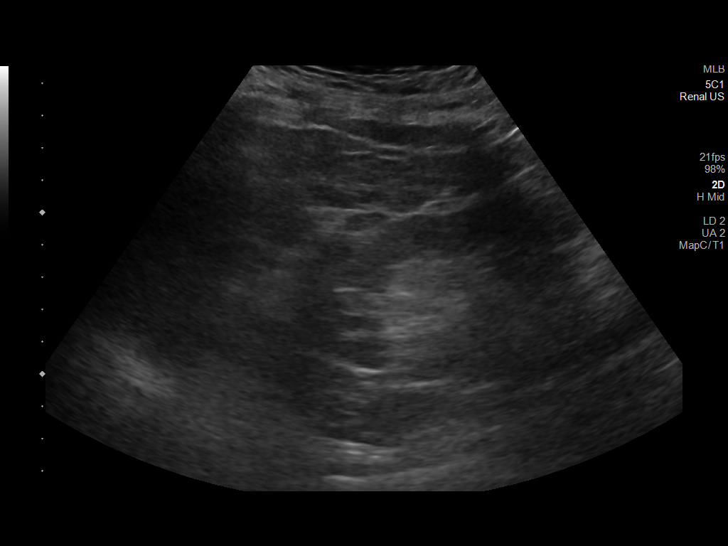
[im 4/11]
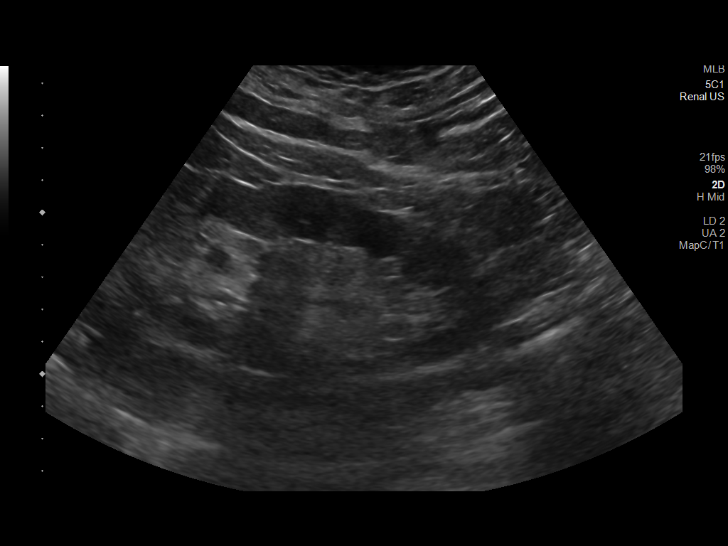
[im 5/11]
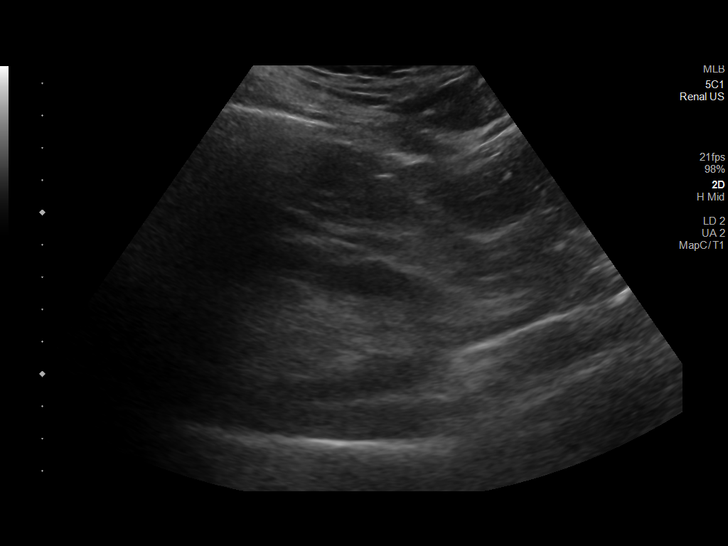
[im 6/11]
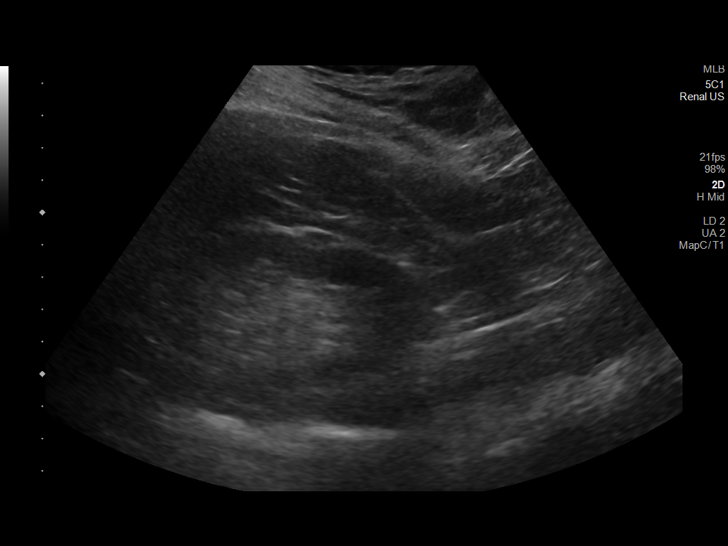
[im 7/11]
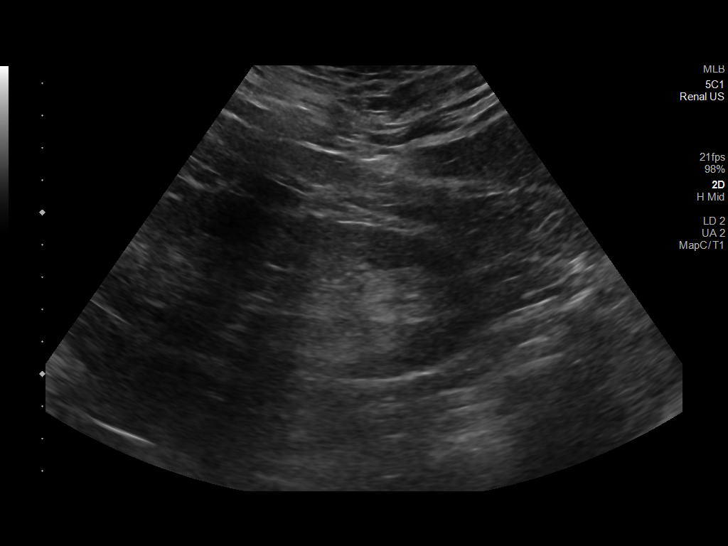
[im 8/11]
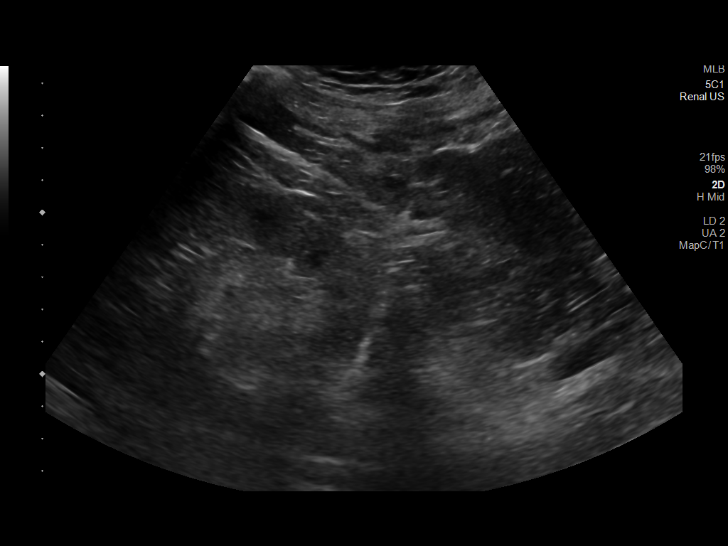
[im 9/11]
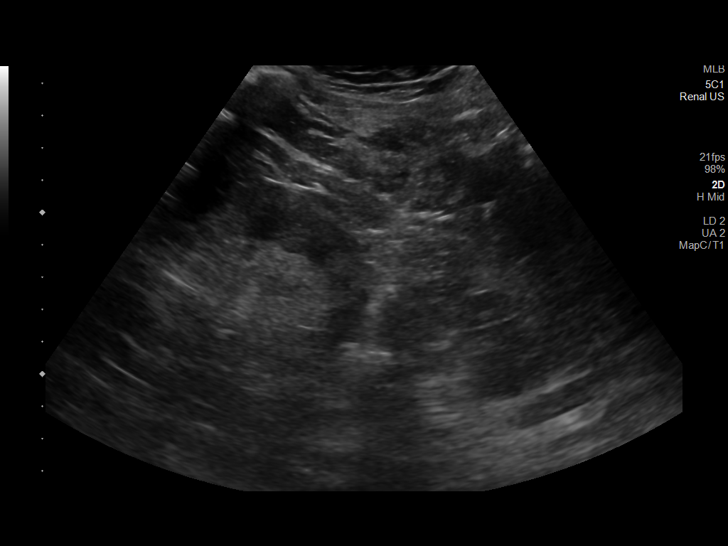
[im 10/11]
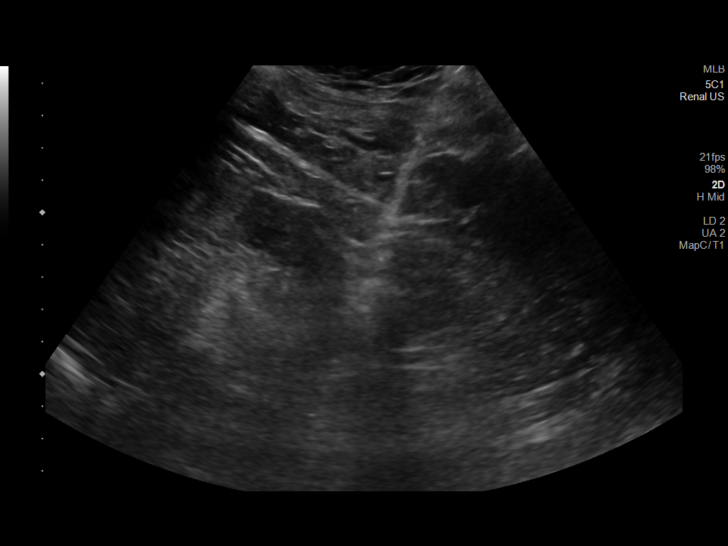
[im 11/11]
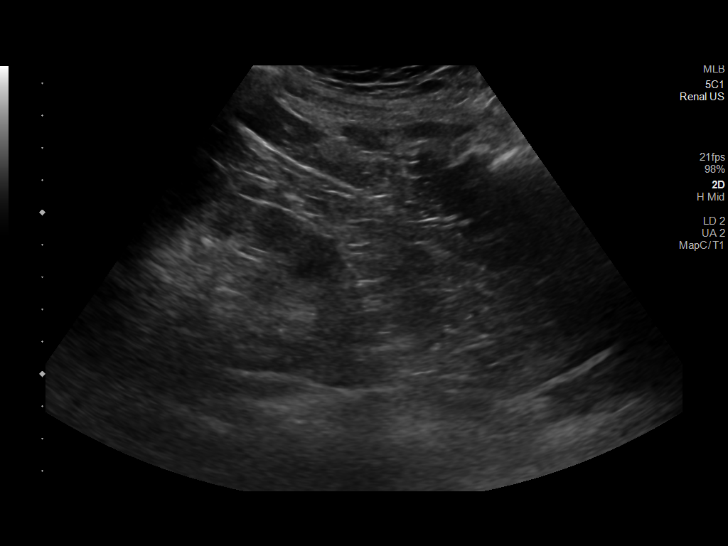

[11 of 11 positions shown; findings below may reference images not displayed]

MEDICATIONS:
None.

ANESTHESIA/SEDATION:
Fentanyl 50 mcg IV; Versed 1 mg IV

Total Moderate Sedation time: 14 minutes; The patient was
continuously monitored during the procedure by the interventional
radiology nurse under my direct supervision.

COMPLICATIONS:
None immediate.

PROCEDURE:
Informed written consent was obtained from the patient after a
discussion of the risks, benefits and alternatives to treatment. The
patient understands and consents the procedure. A timeout was
performed prior to the initiation of the procedure.

Ultrasound scanning was performed of the bilateral flanks. The
inferior pole of the left kidney was selected for biopsy due to
location and sonographic window. The procedure was planned. The
operative site was prepped and draped in the usual sterile fashion.
The overlying soft tissues were anesthetized with 1% lidocaine with
epinephrine. A 16 gauge core needle biopsy device was advanced into
the inferior cortex of the left kidney and 3 core biopsies were
obtained under direct ultrasound guidance. Images were saved for
documentation purposes. The biopsy device was removed and hemostasis
was obtained with injection of Gel-Foam slurry around the renal
capsule and tract as well as manual compression. Post procedural
scanning was negative for significant post procedural hemorrhage or
additional complication. A dressing was placed. The patient
tolerated the procedure well without immediate post procedural
complication.
IMPRESSION: Technically successful ultrasound guided left inferior pole renal
biopsy.

## 2021-07-05 DIAGNOSIS — N185 Chronic kidney disease, stage 5: Secondary | ICD-10-CM | POA: Diagnosis not present

## 2021-07-06 IMAGING — US US RENAL
1 series · 14 of 25 positions shown · non-contrast
Comparison: Renal ultrasound dated 09/17/2020.

CLINICAL DATA: 69-year-old male with acute renal insufficiency.

EXAM:
RENAL / URINARY TRACT ULTRASOUND COMPLETE

[Series 1: us renal · 14 of 46 slices shown]
[im 1/46]
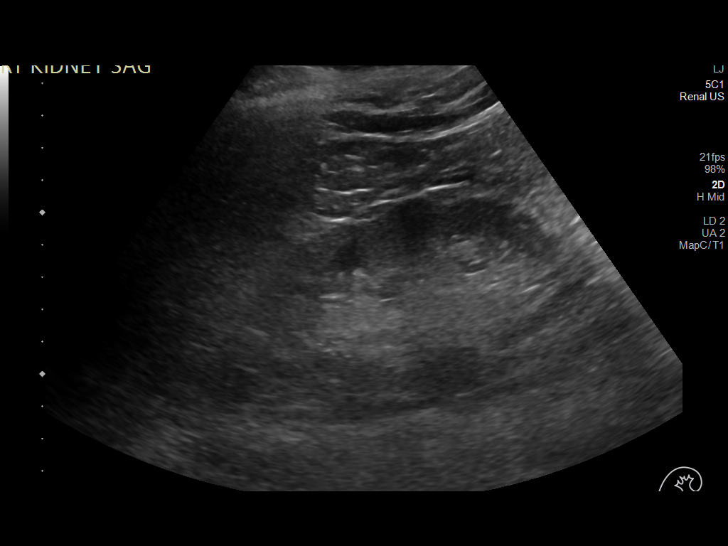
[im 4/46]
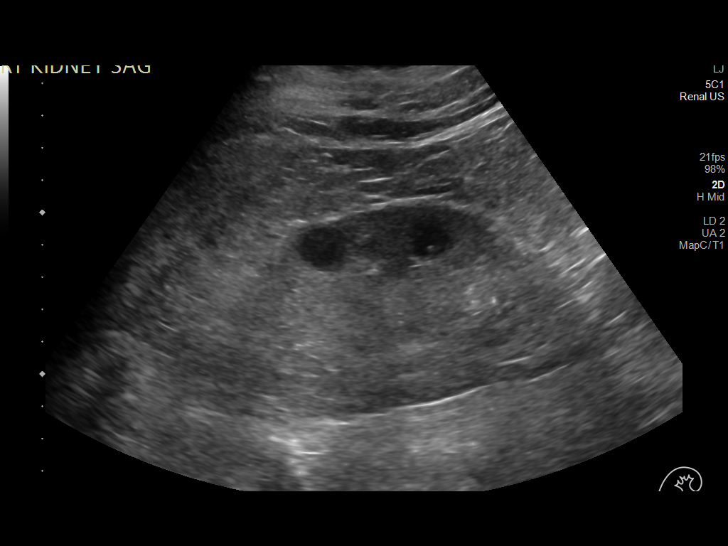
[im 8/46]
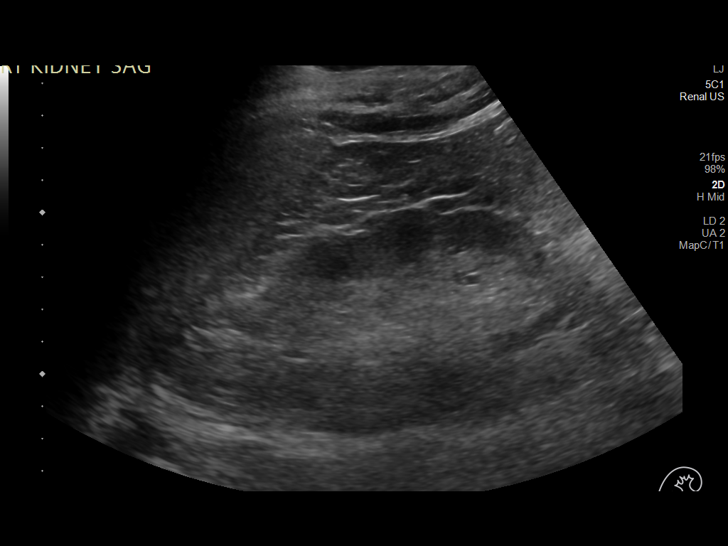
[im 12/46]
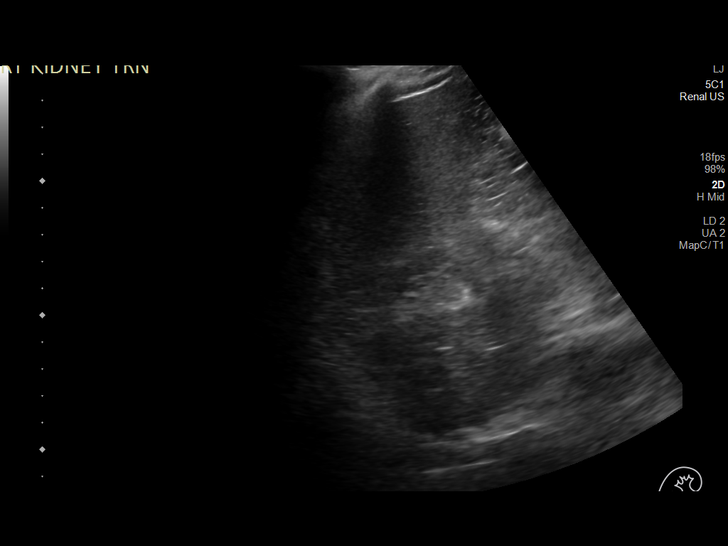
[im 16/46]
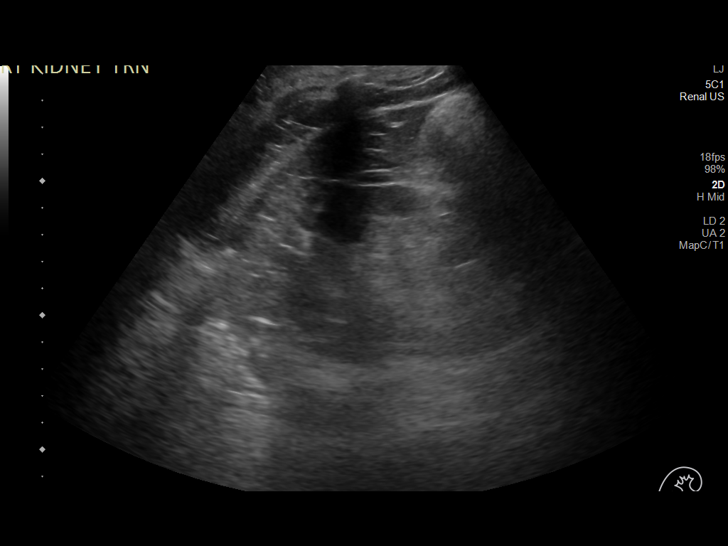
[im 17/46]
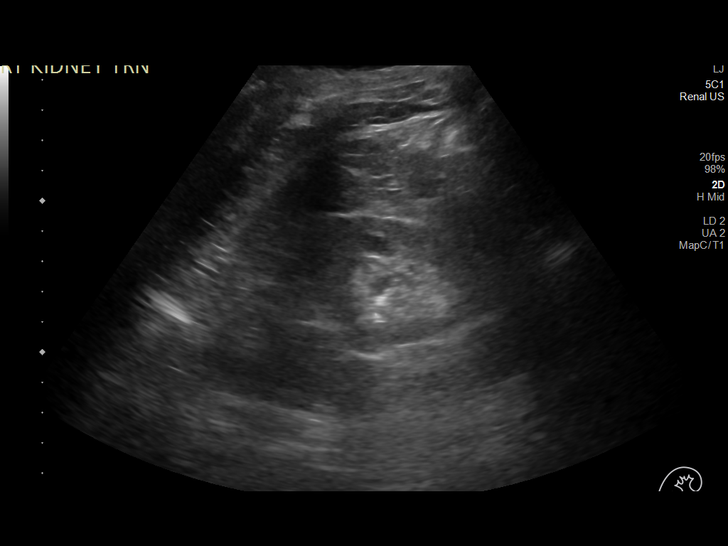
[im 21/46]
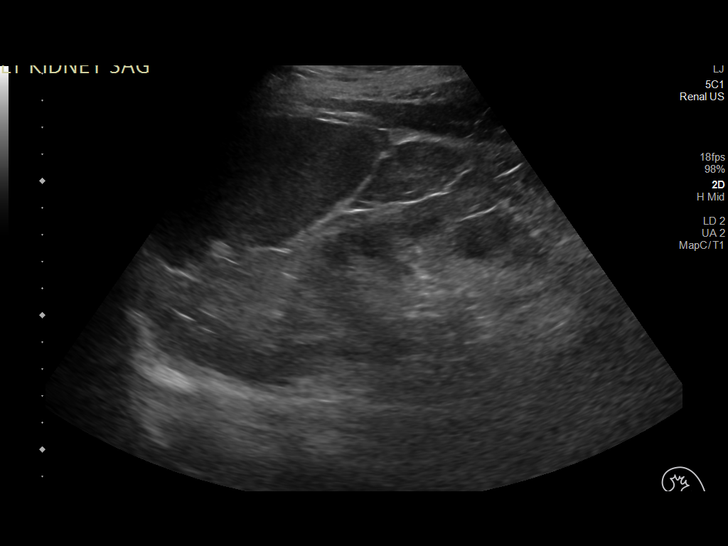
[im 25/46]
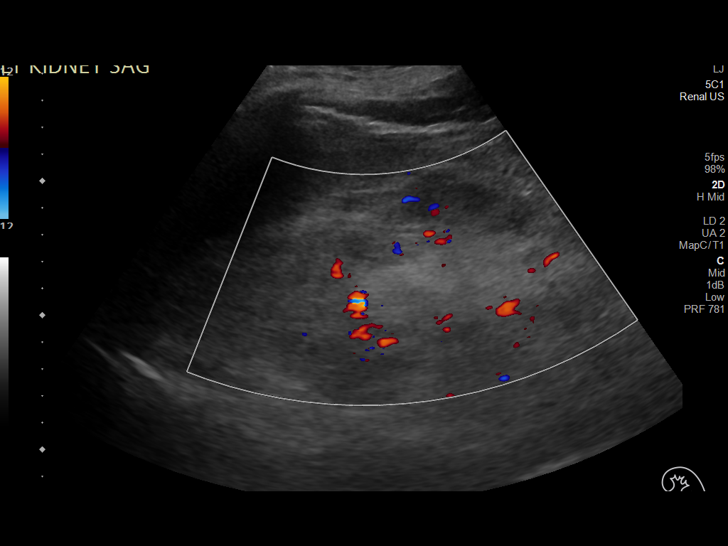
[im 29/46]
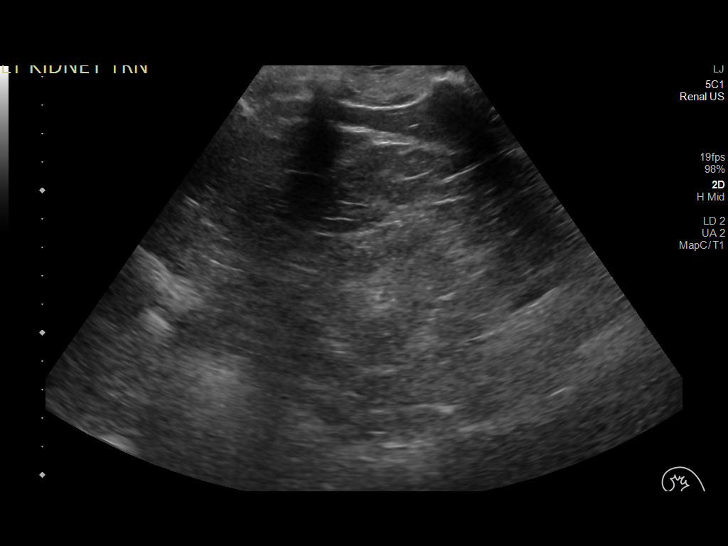
[im 31/46]
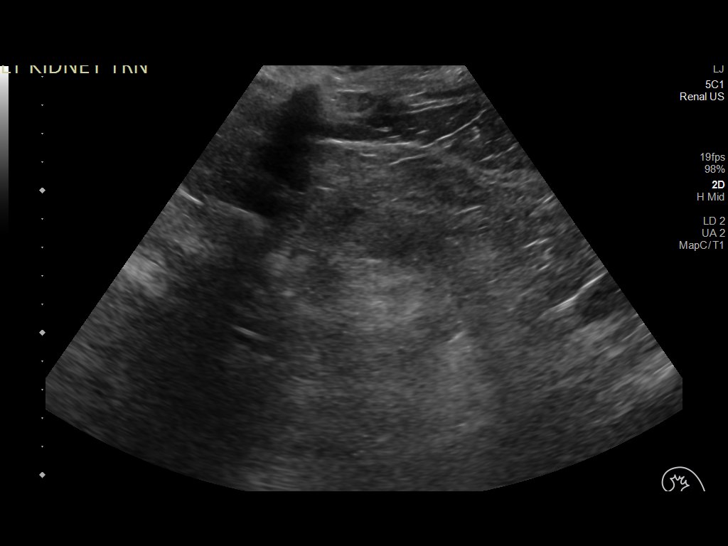
[im 34/46]
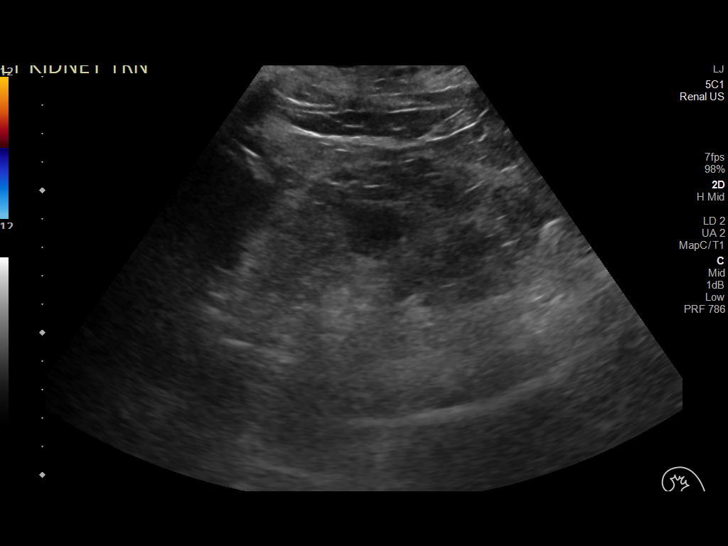
[im 38/46]
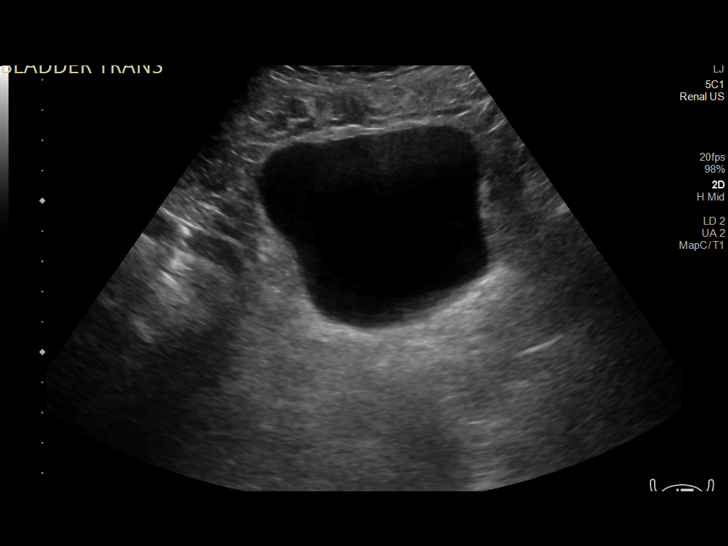
[im 42/46]
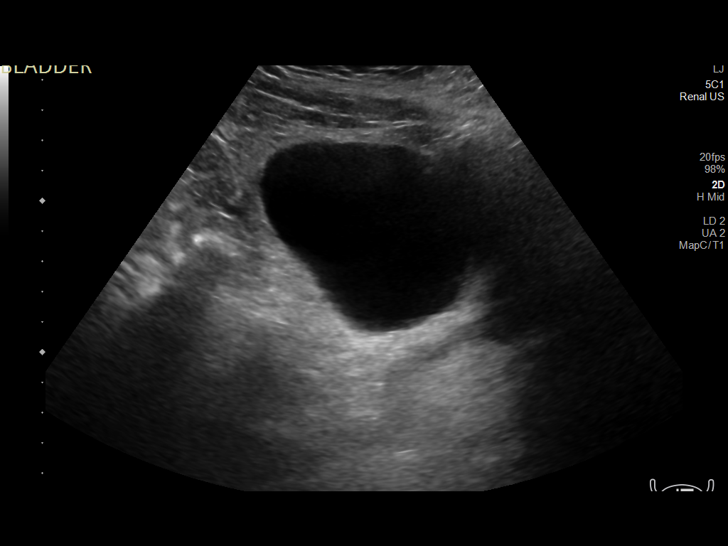
[im 46/46]
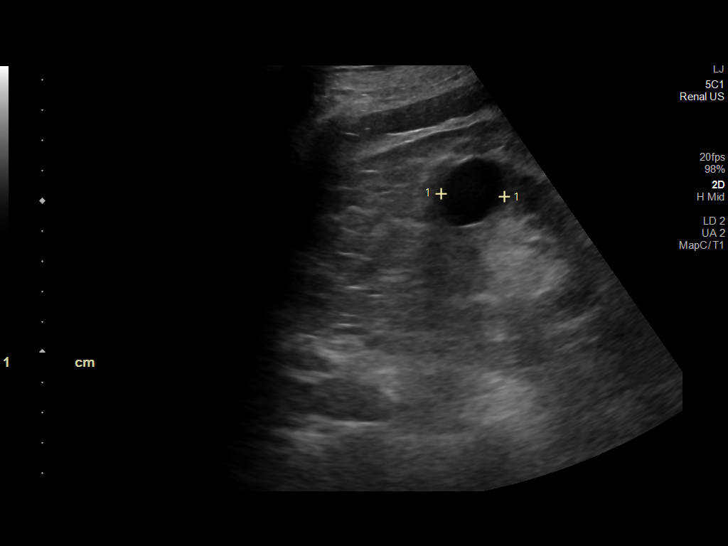

[14 of 25 positions shown; findings below may reference images not displayed]

FINDINGS: Right Kidney:

Renal measurements: 11.4 x 5.3 x 5.8 cm = volume: 175 mL. Mild
parenchyma atrophy and increased echogenicity. No hydronephrosis or
shadowing stone. Several cysts measure up to 2.7 cm.

Left Kidney:

Renal measurements: 11.4 x 5.3 x 4.6 cm = volume: 139 mL. Mild
parenchyma atrophy and increased echogenicity. No hydronephrosis or
shadowing stone.

Bladder:

Appears normal for degree of bladder distention.

Other:

None.
IMPRESSION: Echogenic kidneys in keeping with medical renal disease. No
hydronephrosis or shadowing stone.

## 2021-07-13 DIAGNOSIS — N2581 Secondary hyperparathyroidism of renal origin: Secondary | ICD-10-CM | POA: Diagnosis not present

## 2021-07-13 DIAGNOSIS — E872 Acidosis: Secondary | ICD-10-CM | POA: Diagnosis not present

## 2021-07-13 DIAGNOSIS — E875 Hyperkalemia: Secondary | ICD-10-CM | POA: Diagnosis not present

## 2021-07-13 DIAGNOSIS — I770 Arteriovenous fistula, acquired: Secondary | ICD-10-CM | POA: Diagnosis not present

## 2021-07-13 DIAGNOSIS — I120 Hypertensive chronic kidney disease with stage 5 chronic kidney disease or end stage renal disease: Secondary | ICD-10-CM | POA: Diagnosis not present

## 2021-07-13 DIAGNOSIS — D631 Anemia in chronic kidney disease: Secondary | ICD-10-CM | POA: Diagnosis not present

## 2021-07-13 DIAGNOSIS — N057 Unspecified nephritic syndrome with diffuse crescentic glomerulonephritis: Secondary | ICD-10-CM | POA: Diagnosis not present

## 2021-07-13 DIAGNOSIS — N058 Unspecified nephritic syndrome with other morphologic changes: Secondary | ICD-10-CM | POA: Diagnosis not present

## 2021-07-13 DIAGNOSIS — N185 Chronic kidney disease, stage 5: Secondary | ICD-10-CM | POA: Diagnosis not present

## 2021-07-14 ENCOUNTER — Encounter (HOSPITAL_COMMUNITY)
Admission: RE | Admit: 2021-07-14 | Discharge: 2021-07-14 | Disposition: A | Discharge status: Home / Self Care | Payer: PPO | Source: Ambulatory Visit | Attending: Nephrology | Admitting: Nephrology

## 2021-07-14 ENCOUNTER — Other Ambulatory Visit: Payer: Self-pay

## 2021-07-14 VITALS — BP 140/71 | Temp 98.0°F | Resp 18

## 2021-07-14 DIAGNOSIS — N179 Acute kidney failure, unspecified: Secondary | ICD-10-CM

## 2021-07-14 LAB — POCT HEMOGLOBIN-HEMACUE: Hemoglobin: 10.4 g/dL — ABNORMAL LOW (ref 13.0–17.0)

## 2021-07-14 MED ORDER — EPOETIN ALFA-EPBX 40000 UNIT/ML IJ SOLN: 30000.0000 [IU] | INTRAMUSCULAR | Status: DC

## 2021-07-14 MED ORDER — EPOETIN ALFA-EPBX 40000 UNIT/ML IJ SOLN
INTRAMUSCULAR | Status: AC
  Administered 2021-07-14: 30000 [IU] via SUBCUTANEOUS
  Filled 2021-07-14: qty 1

## 2021-07-22 ENCOUNTER — Other Ambulatory Visit: Payer: Self-pay

## 2021-07-22 ENCOUNTER — Ambulatory Visit (HOSPITAL_COMMUNITY)
Admission: RE | Admit: 2021-07-22 | Discharge: 2021-07-22 | Disposition: A | Discharge status: Home / Self Care | Payer: PPO | Source: Ambulatory Visit | Attending: Vascular Surgery | Admitting: Vascular Surgery

## 2021-07-22 ENCOUNTER — Ambulatory Visit (INDEPENDENT_AMBULATORY_CARE_PROVIDER_SITE_OTHER): Payer: PPO | Admitting: Physician Assistant

## 2021-07-22 VITALS — BP 146/70 | HR 56 | Temp 97.3°F | Resp 16 | Ht 69.0 in | Wt 182.2 lb

## 2021-07-22 DIAGNOSIS — N185 Chronic kidney disease, stage 5: Secondary | ICD-10-CM

## 2021-07-22 NOTE — Progress Notes (Signed)
POST OPERATIVE OFFICE NOTE    CC:  F/u for surgery  HPI:  This is a 70 y.o. male who is s/p  Left brachiocephalic AV fistula on 01/27/16 by Dr. Oneida Alar.    Pt returns today for follow up.  Pt states No loss of motor or sensation and no hand pain.  He is currently CKD stage 5 and not requiring HD.    No Known Allergies  Current Outpatient Medications  Medication Sig Dispense Refill   albuterol (VENTOLIN HFA) 108 (90 Base) MCG/ACT inhaler Inhale 2 puffs into the lungs every 6 (six) hours as needed for wheezing or shortness of breath. 8 g 6   atenolol (TENORMIN) 50 MG tablet Take 50 mg by mouth daily.     Cholecalciferol (VITAMIN D) 50 MCG (2000 UT) CAPS Take 2,000 Units by mouth daily.     LOKELMA 10 g PACK packet Take by mouth.     omeprazole (PRILOSEC) 40 MG capsule Take 40 mg by mouth every morning.     Tiotropium Bromide Monohydrate (SPIRIVA RESPIMAT) 2.5 MCG/ACT AERS Inhale 2 puffs into the lungs daily. 4 g 6   vitamin B-12 (CYANOCOBALAMIN) 1000 MCG tablet Take 1,000 mcg by mouth daily.     BD PEN NEEDLE NANO 2ND GEN 32G X 4 MM MISC  (Patient not taking: Reported on 07/22/2021)     oxyCODONE-acetaminophen (PERCOCET) 5-325 MG tablet Take 1 tablet by mouth every 6 (six) hours as needed for severe pain. (Patient not taking: Reported on 07/22/2021) 10 tablet 0   No current facility-administered medications for this visit.     ROS:  See HPI  Physical Exam:     Findings:  +--------------------+----------+-----------------+--------+  AVF                 PSV (cm/s)Flow Vol (mL/min)Comments  +--------------------+----------+-----------------+--------+  Native artery inflow   234          1684                 +--------------------+----------+-----------------+--------+  AVF Anastomosis        721                               +--------------------+----------+-----------------+--------+      +------------+----------+-------------+----------+----------------+  OUTFLOW  VEINPSV (cm/s)Diameter (cm)Depth (cm)    Describe      +------------+----------+-------------+----------+----------------+  Shoulder       250        0.51        0.34                     +------------+----------+-------------+----------+----------------+  Prox UA        247        0.54        0.43                     +------------+----------+-------------+----------+----------------+  Mid UA         233        0.60        0.33   competing branch  +------------+----------+-------------+----------+----------------+  Dist UA        300        66.00       0.47   competing branch  +------------+----------+-------------+----------+----------------+  AC Fossa       545        0.46        0.35                     +------------+----------+-------------+----------+----------------+  Incision:  Well healed Extremities:  Palpable radial pulse, excellent palpable thrill in fistula, grip 5/5 equal B UE    Assessment/Plan:  This is a 70 y.o. male who is s/p:Left brachiocephalic AV fistula on 2/34/68 by Dr. Oneida Alar.    -The fistula is maturing well and the depth is acceptable for use.  If he requires HD the fistula may be accessed as of 08/13/21.  If there are problems or concerns he will call our office, otherwise f/u PRN.   Roxy Horseman PA-C [facility] [phone]   Clinic MD:  Scot Dock

## 2021-07-28 ENCOUNTER — Encounter (HOSPITAL_COMMUNITY)
Admission: RE | Admit: 2021-07-28 | Discharge: 2021-07-28 | Disposition: A | Discharge status: Home / Self Care | Payer: PPO | Source: Ambulatory Visit | Attending: Nephrology | Admitting: Nephrology

## 2021-07-28 ENCOUNTER — Encounter (HOSPITAL_COMMUNITY): Payer: PPO

## 2021-07-28 VITALS — BP 167/71 | HR 52 | Temp 98.0°F | Resp 18 | Ht 69.0 in | Wt 180.0 lb

## 2021-07-28 DIAGNOSIS — N179 Acute kidney failure, unspecified: Secondary | ICD-10-CM | POA: Insufficient documentation

## 2021-07-28 LAB — POCT HEMOGLOBIN-HEMACUE: Hemoglobin: 11.1 g/dL — ABNORMAL LOW (ref 13.0–17.0)

## 2021-07-28 MED ORDER — EPOETIN ALFA-EPBX 40000 UNIT/ML IJ SOLN
INTRAMUSCULAR | Status: AC
  Administered 2021-07-28: 30000 [IU] via SUBCUTANEOUS
  Filled 2021-07-28: qty 1

## 2021-07-28 MED ORDER — SODIUM CHLORIDE 0.9 % IV SOLN
510.0000 mg | Freq: Once | INTRAVENOUS | Status: AC
  Administered 2021-07-28: 510 mg via INTRAVENOUS
  Filled 2021-07-28: qty 510

## 2021-07-28 MED ORDER — EPOETIN ALFA-EPBX 40000 UNIT/ML IJ SOLN: 30000.0000 [IU] | INTRAMUSCULAR | Status: DC

## 2021-08-11 ENCOUNTER — Encounter (HOSPITAL_COMMUNITY)
Admission: RE | Admit: 2021-08-11 | Discharge: 2021-08-11 | Disposition: A | Discharge status: Home / Self Care | Payer: PPO | Source: Ambulatory Visit | Attending: Nephrology | Admitting: Nephrology

## 2021-08-11 ENCOUNTER — Other Ambulatory Visit: Payer: Self-pay

## 2021-08-11 VITALS — BP 139/69 | HR 59 | Resp 19

## 2021-08-11 DIAGNOSIS — N179 Acute kidney failure, unspecified: Secondary | ICD-10-CM

## 2021-08-11 LAB — IRON AND TIBC
Iron: 81 ug/dL (ref 45–182)
Saturation Ratios: 31 % (ref 17.9–39.5)
TIBC: 265 ug/dL (ref 250–450)
UIBC: 184 ug/dL

## 2021-08-11 LAB — FERRITIN: Ferritin: 682 ng/mL — ABNORMAL HIGH (ref 24–336)

## 2021-08-11 LAB — POCT HEMOGLOBIN-HEMACUE: Hemoglobin: 11 g/dL — ABNORMAL LOW (ref 13.0–17.0)

## 2021-08-11 MED ORDER — EPOETIN ALFA-EPBX 40000 UNIT/ML IJ SOLN
INTRAMUSCULAR | Status: AC
  Administered 2021-08-11: 30000 [IU] via SUBCUTANEOUS
  Filled 2021-08-11: qty 1

## 2021-08-11 MED ORDER — EPOETIN ALFA-EPBX 40000 UNIT/ML IJ SOLN: 30000.0000 [IU] | INTRAMUSCULAR | Status: DC

## 2021-08-25 ENCOUNTER — Encounter (HOSPITAL_COMMUNITY)
Admission: RE | Admit: 2021-08-25 | Discharge: 2021-08-25 | Disposition: A | Discharge status: Home / Self Care | Payer: PPO | Source: Ambulatory Visit | Attending: Nephrology | Admitting: Nephrology

## 2021-08-25 ENCOUNTER — Other Ambulatory Visit: Payer: Self-pay

## 2021-08-25 VITALS — BP 138/72 | Temp 97.6°F | Resp 16

## 2021-08-25 DIAGNOSIS — N179 Acute kidney failure, unspecified: Secondary | ICD-10-CM | POA: Diagnosis not present

## 2021-08-25 DIAGNOSIS — D631 Anemia in chronic kidney disease: Secondary | ICD-10-CM | POA: Diagnosis not present

## 2021-08-25 LAB — POCT HEMOGLOBIN-HEMACUE: Hemoglobin: 11.3 g/dL — ABNORMAL LOW (ref 13.0–17.0)

## 2021-08-25 MED ORDER — EPOETIN ALFA-EPBX 40000 UNIT/ML IJ SOLN
INTRAMUSCULAR | Status: AC
  Administered 2021-08-25: 30000 [IU] via SUBCUTANEOUS
  Filled 2021-08-25: qty 1

## 2021-08-25 MED ORDER — EPOETIN ALFA-EPBX 40000 UNIT/ML IJ SOLN: 30000.0000 [IU] | INTRAMUSCULAR | Status: DC

## 2021-08-31 DIAGNOSIS — D638 Anemia in other chronic diseases classified elsewhere: Secondary | ICD-10-CM | POA: Diagnosis not present

## 2021-08-31 DIAGNOSIS — E099 Drug or chemical induced diabetes mellitus without complications: Secondary | ICD-10-CM | POA: Diagnosis not present

## 2021-08-31 DIAGNOSIS — E119 Type 2 diabetes mellitus without complications: Secondary | ICD-10-CM | POA: Diagnosis not present

## 2021-08-31 DIAGNOSIS — N057 Unspecified nephritic syndrome with diffuse crescentic glomerulonephritis: Secondary | ICD-10-CM | POA: Diagnosis not present

## 2021-08-31 DIAGNOSIS — J439 Emphysema, unspecified: Secondary | ICD-10-CM | POA: Diagnosis not present

## 2021-08-31 DIAGNOSIS — N185 Chronic kidney disease, stage 5: Secondary | ICD-10-CM | POA: Diagnosis not present

## 2021-08-31 DIAGNOSIS — N2581 Secondary hyperparathyroidism of renal origin: Secondary | ICD-10-CM | POA: Diagnosis not present

## 2021-09-08 ENCOUNTER — Encounter (HOSPITAL_COMMUNITY)
Admission: RE | Admit: 2021-09-08 | Discharge: 2021-09-08 | Disposition: A | Discharge status: Home / Self Care | Payer: PPO | Source: Ambulatory Visit | Attending: Nephrology | Admitting: Nephrology

## 2021-09-08 ENCOUNTER — Other Ambulatory Visit: Payer: Self-pay

## 2021-09-08 VITALS — BP 152/69 | Temp 97.8°F | Resp 20

## 2021-09-08 DIAGNOSIS — N179 Acute kidney failure, unspecified: Secondary | ICD-10-CM | POA: Diagnosis not present

## 2021-09-08 DIAGNOSIS — N185 Chronic kidney disease, stage 5: Secondary | ICD-10-CM | POA: Diagnosis not present

## 2021-09-08 LAB — IRON AND TIBC
Iron: 80 ug/dL (ref 45–182)
Saturation Ratios: 31 % (ref 17.9–39.5)
TIBC: 260 ug/dL (ref 250–450)
UIBC: 180 ug/dL

## 2021-09-08 LAB — POCT HEMOGLOBIN-HEMACUE: Hemoglobin: 11.6 g/dL — ABNORMAL LOW (ref 13.0–17.0)

## 2021-09-08 LAB — FERRITIN: Ferritin: 430 ng/mL — ABNORMAL HIGH (ref 24–336)

## 2021-09-08 MED ORDER — EPOETIN ALFA-EPBX 40000 UNIT/ML IJ SOLN
30000.0000 [IU] | INTRAMUSCULAR | Status: DC
  Administered 2021-09-08: 30000 [IU] via SUBCUTANEOUS

## 2021-09-08 MED ORDER — EPOETIN ALFA-EPBX 40000 UNIT/ML IJ SOLN
INTRAMUSCULAR | Status: AC
  Filled 2021-09-08: qty 1

## 2021-09-18 ENCOUNTER — Other Ambulatory Visit: Payer: Self-pay | Admitting: Pulmonary Disease

## 2021-09-18 DIAGNOSIS — J439 Emphysema, unspecified: Secondary | ICD-10-CM

## 2021-09-21 DIAGNOSIS — N057 Unspecified nephritic syndrome with diffuse crescentic glomerulonephritis: Secondary | ICD-10-CM | POA: Diagnosis not present

## 2021-09-21 DIAGNOSIS — N185 Chronic kidney disease, stage 5: Secondary | ICD-10-CM | POA: Diagnosis not present

## 2021-09-21 DIAGNOSIS — N2581 Secondary hyperparathyroidism of renal origin: Secondary | ICD-10-CM | POA: Diagnosis not present

## 2021-09-21 DIAGNOSIS — N058 Unspecified nephritic syndrome with other morphologic changes: Secondary | ICD-10-CM | POA: Diagnosis not present

## 2021-09-21 DIAGNOSIS — D631 Anemia in chronic kidney disease: Secondary | ICD-10-CM | POA: Diagnosis not present

## 2021-09-21 DIAGNOSIS — I120 Hypertensive chronic kidney disease with stage 5 chronic kidney disease or end stage renal disease: Secondary | ICD-10-CM | POA: Diagnosis not present

## 2021-09-21 DIAGNOSIS — E872 Acidosis, unspecified: Secondary | ICD-10-CM | POA: Diagnosis not present

## 2021-09-21 DIAGNOSIS — E875 Hyperkalemia: Secondary | ICD-10-CM | POA: Diagnosis not present

## 2021-09-22 ENCOUNTER — Encounter (HOSPITAL_COMMUNITY)
Admission: RE | Admit: 2021-09-22 | Discharge: 2021-09-22 | Disposition: A | Discharge status: Home / Self Care | Payer: PPO | Source: Ambulatory Visit | Attending: Nephrology | Admitting: Nephrology

## 2021-09-22 ENCOUNTER — Other Ambulatory Visit: Payer: Self-pay

## 2021-09-22 VITALS — BP 155/74 | HR 59 | Temp 97.0°F | Resp 20

## 2021-09-22 DIAGNOSIS — N179 Acute kidney failure, unspecified: Secondary | ICD-10-CM | POA: Diagnosis not present

## 2021-09-22 LAB — POCT HEMOGLOBIN-HEMACUE: Hemoglobin: 11.7 g/dL — ABNORMAL LOW (ref 13.0–17.0)

## 2021-09-22 MED ORDER — EPOETIN ALFA-EPBX 40000 UNIT/ML IJ SOLN
INTRAMUSCULAR | Status: AC
  Filled 2021-09-22: qty 1

## 2021-09-22 MED ORDER — EPOETIN ALFA-EPBX 40000 UNIT/ML IJ SOLN
30000.0000 [IU] | INTRAMUSCULAR | Status: DC
  Administered 2021-09-22: 30000 [IU] via SUBCUTANEOUS

## 2021-10-06 ENCOUNTER — Other Ambulatory Visit: Payer: Self-pay

## 2021-10-06 ENCOUNTER — Encounter (HOSPITAL_COMMUNITY)
Admission: RE | Admit: 2021-10-06 | Discharge: 2021-10-06 | Disposition: A | Discharge status: Home / Self Care | Payer: PPO | Source: Ambulatory Visit | Attending: Nephrology | Admitting: Nephrology

## 2021-10-06 VITALS — BP 153/74 | HR 54 | Temp 97.1°F

## 2021-10-06 DIAGNOSIS — D631 Anemia in chronic kidney disease: Secondary | ICD-10-CM | POA: Insufficient documentation

## 2021-10-06 DIAGNOSIS — N185 Chronic kidney disease, stage 5: Secondary | ICD-10-CM | POA: Insufficient documentation

## 2021-10-06 DIAGNOSIS — N179 Acute kidney failure, unspecified: Secondary | ICD-10-CM | POA: Insufficient documentation

## 2021-10-06 LAB — IRON AND TIBC
Iron: 93 ug/dL (ref 45–182)
Saturation Ratios: 30 % (ref 17.9–39.5)
TIBC: 305 ug/dL (ref 250–450)
UIBC: 212 ug/dL

## 2021-10-06 LAB — POCT HEMOGLOBIN-HEMACUE: Hemoglobin: 11.9 g/dL — ABNORMAL LOW (ref 13.0–17.0)

## 2021-10-06 LAB — FERRITIN: Ferritin: 379 ng/mL — ABNORMAL HIGH (ref 24–336)

## 2021-10-06 MED ORDER — EPOETIN ALFA-EPBX 40000 UNIT/ML IJ SOLN
30000.0000 [IU] | INTRAMUSCULAR | Status: DC
  Administered 2021-10-06: 30000 [IU] via SUBCUTANEOUS

## 2021-10-06 MED ORDER — EPOETIN ALFA-EPBX 40000 UNIT/ML IJ SOLN
INTRAMUSCULAR | Status: AC
  Filled 2021-10-06: qty 1

## 2021-10-11 DIAGNOSIS — N179 Acute kidney failure, unspecified: Secondary | ICD-10-CM | POA: Diagnosis not present

## 2021-10-11 DIAGNOSIS — N200 Calculus of kidney: Secondary | ICD-10-CM | POA: Diagnosis not present

## 2021-10-20 ENCOUNTER — Encounter (HOSPITAL_COMMUNITY): Payer: PPO

## 2021-10-20 ENCOUNTER — Other Ambulatory Visit: Payer: Self-pay

## 2021-10-20 ENCOUNTER — Encounter (HOSPITAL_COMMUNITY)
Admission: RE | Admit: 2021-10-20 | Discharge: 2021-10-20 | Disposition: A | Discharge status: Home / Self Care | Payer: PPO | Source: Ambulatory Visit | Attending: Nephrology | Admitting: Nephrology

## 2021-10-20 VITALS — BP 137/75 | Temp 97.3°F

## 2021-10-20 DIAGNOSIS — N179 Acute kidney failure, unspecified: Secondary | ICD-10-CM | POA: Insufficient documentation

## 2021-10-20 DIAGNOSIS — D631 Anemia in chronic kidney disease: Secondary | ICD-10-CM | POA: Insufficient documentation

## 2021-10-20 MED ORDER — EPOETIN ALFA-EPBX 40000 UNIT/ML IJ SOLN
INTRAMUSCULAR | Status: AC
  Filled 2021-10-20: qty 1

## 2021-10-20 MED ORDER — EPOETIN ALFA-EPBX 40000 UNIT/ML IJ SOLN
30000.0000 [IU] | INTRAMUSCULAR | Status: DC
  Administered 2021-10-20: 30000 [IU] via SUBCUTANEOUS

## 2021-10-21 LAB — POCT HEMOGLOBIN-HEMACUE: Hemoglobin: 11.6 g/dL — ABNORMAL LOW (ref 13.0–17.0)

## 2021-11-01 DIAGNOSIS — Z01818 Encounter for other preprocedural examination: Secondary | ICD-10-CM | POA: Diagnosis not present

## 2021-11-01 DIAGNOSIS — Z114 Encounter for screening for human immunodeficiency virus [HIV]: Secondary | ICD-10-CM | POA: Diagnosis not present

## 2021-11-01 DIAGNOSIS — N185 Chronic kidney disease, stage 5: Secondary | ICD-10-CM | POA: Diagnosis not present

## 2021-11-01 DIAGNOSIS — I120 Hypertensive chronic kidney disease with stage 5 chronic kidney disease or end stage renal disease: Secondary | ICD-10-CM | POA: Diagnosis not present

## 2021-11-01 DIAGNOSIS — Z1159 Encounter for screening for other viral diseases: Secondary | ICD-10-CM | POA: Diagnosis not present

## 2021-11-01 DIAGNOSIS — Z125 Encounter for screening for malignant neoplasm of prostate: Secondary | ICD-10-CM | POA: Diagnosis not present

## 2021-11-01 DIAGNOSIS — N186 End stage renal disease: Secondary | ICD-10-CM | POA: Diagnosis not present

## 2021-11-03 ENCOUNTER — Encounter (HOSPITAL_COMMUNITY)
Admission: RE | Admit: 2021-11-03 | Discharge: 2021-11-03 | Disposition: A | Discharge status: Home / Self Care | Payer: PPO | Source: Ambulatory Visit | Attending: Nephrology | Admitting: Nephrology

## 2021-11-03 ENCOUNTER — Other Ambulatory Visit: Payer: Self-pay

## 2021-11-03 VITALS — BP 144/76 | HR 56 | Temp 97.3°F | Resp 20

## 2021-11-03 DIAGNOSIS — N179 Acute kidney failure, unspecified: Secondary | ICD-10-CM

## 2021-11-03 LAB — IRON AND TIBC
Iron: 99 ug/dL (ref 45–182)
Saturation Ratios: 33 % (ref 17.9–39.5)
TIBC: 302 ug/dL (ref 250–450)
UIBC: 203 ug/dL

## 2021-11-03 LAB — FERRITIN: Ferritin: 288 ng/mL (ref 24–336)

## 2021-11-03 LAB — POCT HEMOGLOBIN-HEMACUE: Hemoglobin: 11.5 g/dL — ABNORMAL LOW (ref 13.0–17.0)

## 2021-11-03 MED ORDER — EPOETIN ALFA-EPBX 40000 UNIT/ML IJ SOLN
INTRAMUSCULAR | Status: AC
  Filled 2021-11-03: qty 1

## 2021-11-03 MED ORDER — EPOETIN ALFA-EPBX 40000 UNIT/ML IJ SOLN
30000.0000 [IU] | INTRAMUSCULAR | Status: DC
  Administered 2021-11-03: 14:00:00 30000 [IU] via SUBCUTANEOUS

## 2021-11-06 IMAGING — CT CT CHEST HIGH RESOLUTION W/O CM
2 of 7 series · 14 of 36 positions shown, 17 images · non-contrast
Comparison: Chest CT 11/02/2020.

CLINICAL DATA: 69-year-old male with history of shortness of
breath. Evaluate for interstitial lung disease. Former smoker.

EXAM:
CT CHEST WITHOUT CONTRAST
TECHNIQUE: Multidetector CT imaging of the chest was performed following the
standard protocol without intravenous contrast. High resolution
imaging of the lungs, as well as inspiratory and expiratory imaging,
was performed.

[Series 4: high resolution · axial · 0.74mm/px · z∈[-308,-58]mm · 11 of 299 slices shown, 14 images]
[im 25/299  mediastinal]
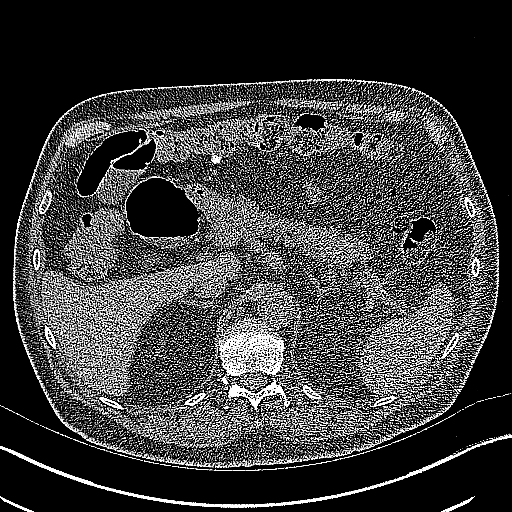
[im 25/299  lung]
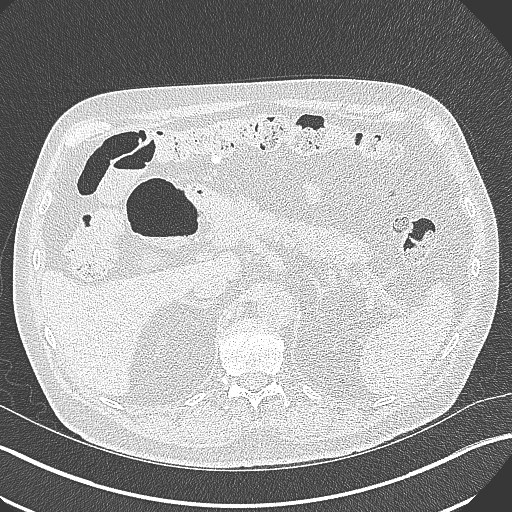
[im 50/299  lung]
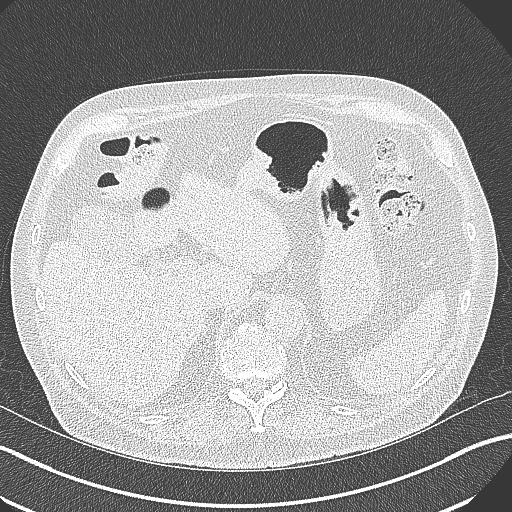
[im 75/299  lung]
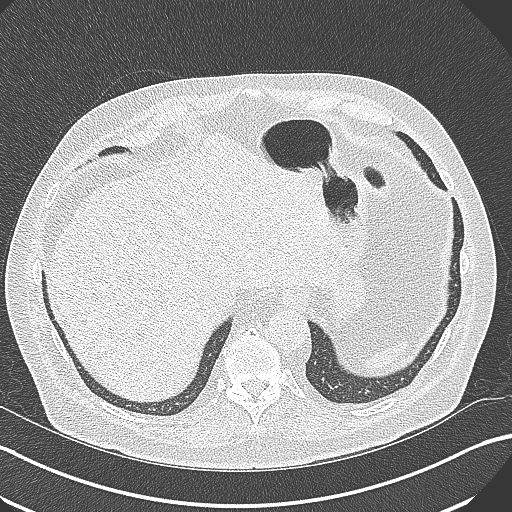
[im 100/299  lung]
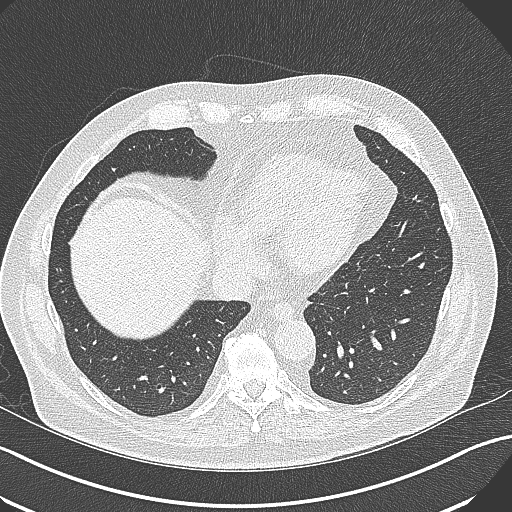
[im 125/299  mediastinal]
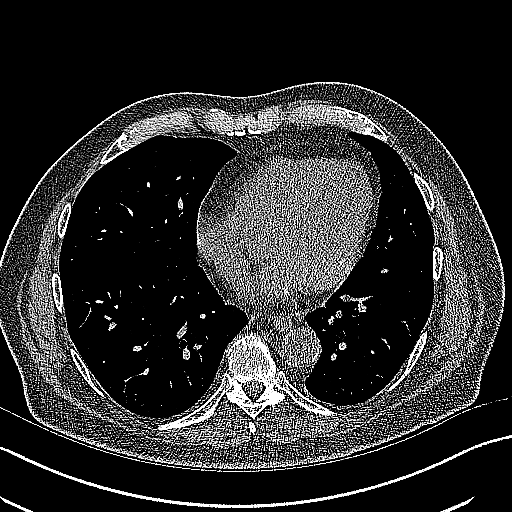
[im 125/299  lung]
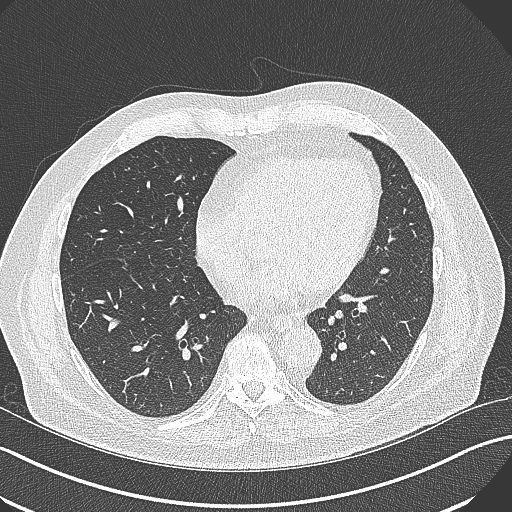
[im 150/299  lung]
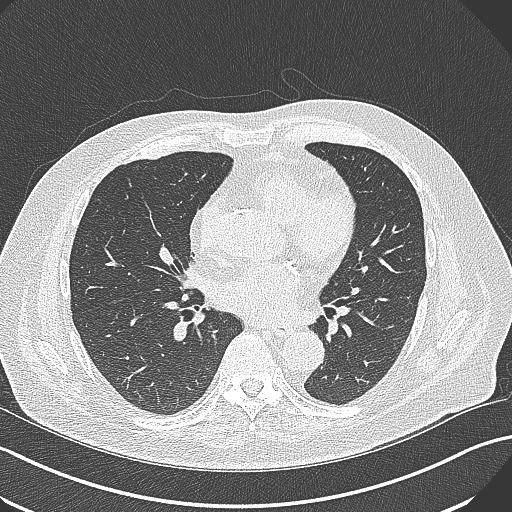
[im 174/299  lung]
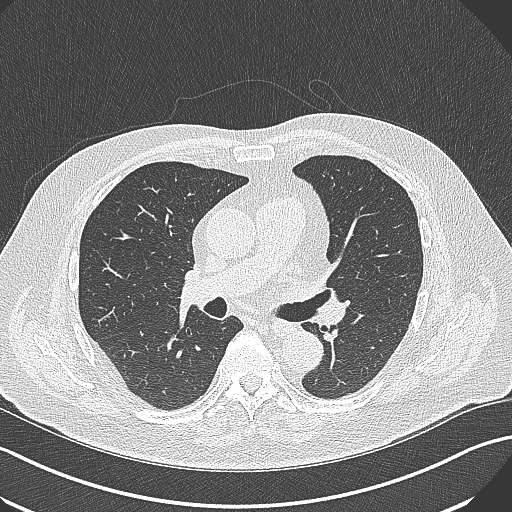
[im 199/299  lung]
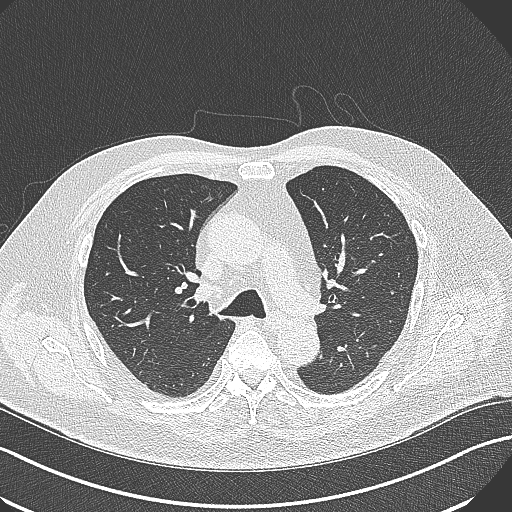
[im 224/299  mediastinal]
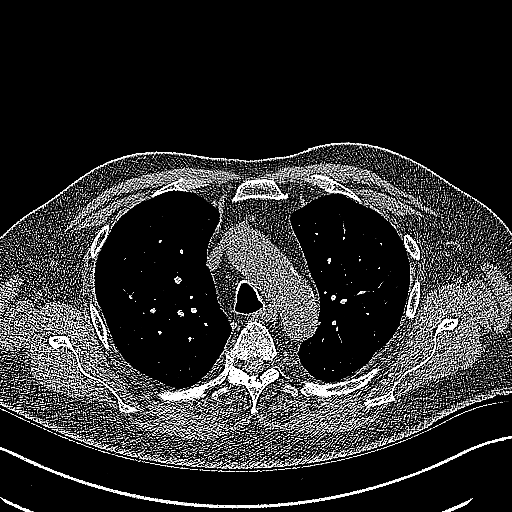
[im 224/299  lung]
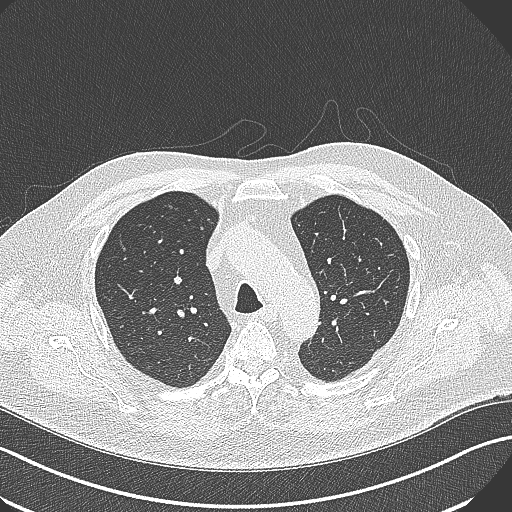
[im 249/299  lung]
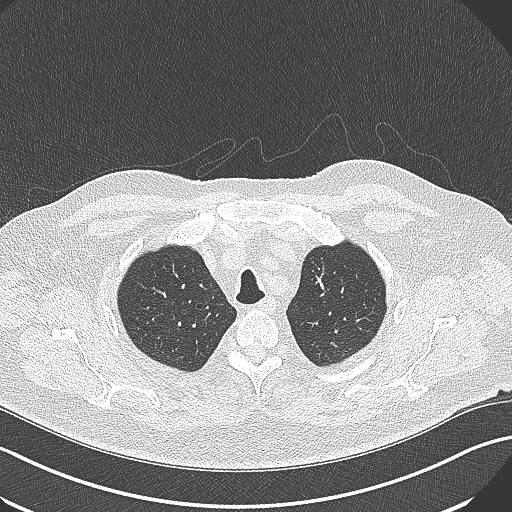
[im 274/299  lung]
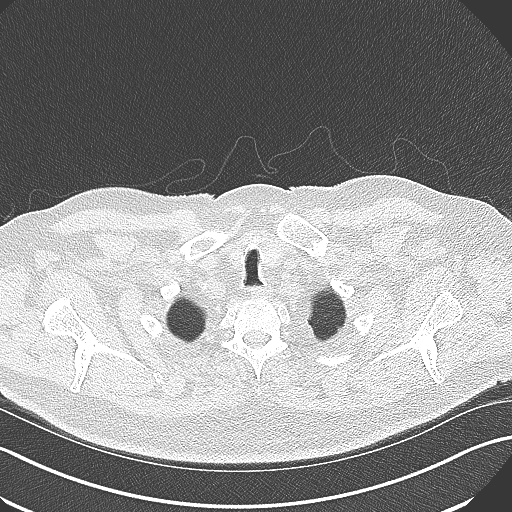

[Series 6: coronal · coronal · 0.59mm/px · 3 of 128 slices shown]
[im 26/128  lung]
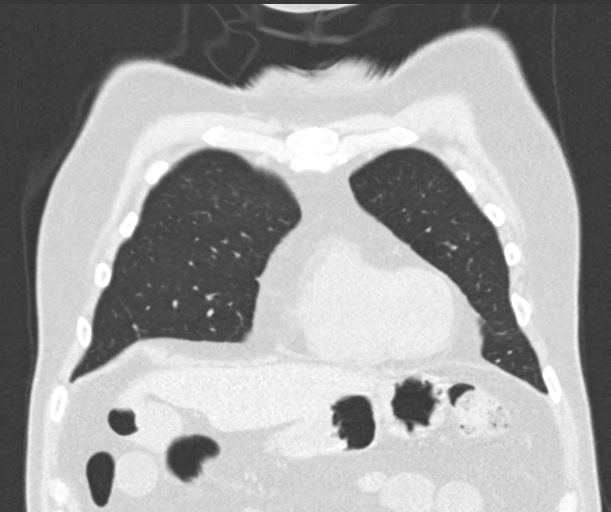
[im 51/128  lung]
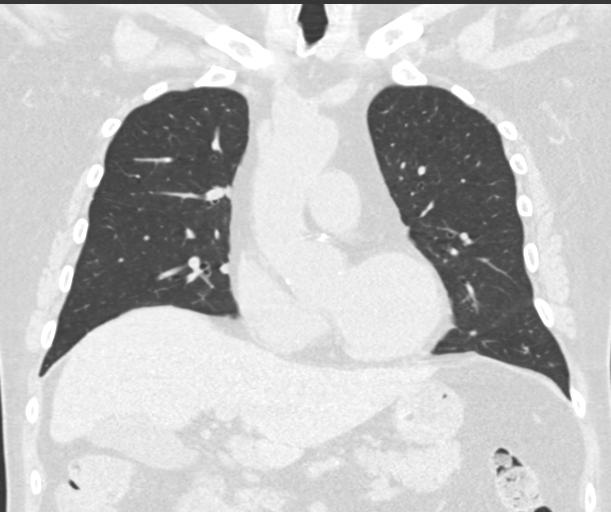
[im 77/128  lung]
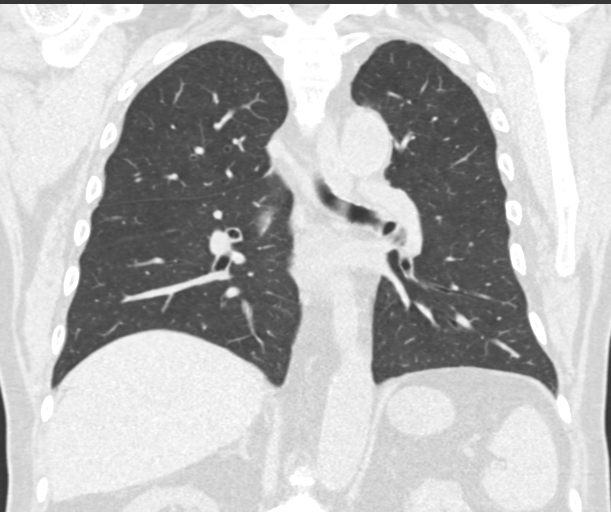

[14 of 36 positions shown; findings below may reference images not displayed]

FINDINGS: Cardiovascular: Heart size is normal. There is no significant
pericardial fluid, thickening or pericardial calcification. There is
aortic atherosclerosis, as well as atherosclerosis of the great
vessels of the mediastinum and the coronary arteries, including
calcified atherosclerotic plaque in the left main, left anterior
descending, left circumflex and right coronary arteries.

Mediastinum/Nodes: No pathologically enlarged mediastinal or hilar
lymph nodes. Please note that accurate exclusion of hilar adenopathy
is limited on noncontrast CT scans. Esophagus is unremarkable in
appearance. No axillary lymphadenopathy.

Lungs/Pleura: High-resolution images demonstrate no significant
regions of ground-glass attenuation, septal thickening, subpleural
reticulation, parenchymal banding, traction bronchiectasis or frank
honeycombing to suggest interstitial lung disease. Inspiratory and
expiratory imaging is unremarkable. No acute consolidative airspace
disease. Mild diffuse bronchial wall thickening with very mild
centrilobular and paraseptal emphysema. No pleural effusions. No
suspicious appearing pulmonary nodules or masses are noted. Saber
sheath trachea.

Upper Abdomen: Aortic atherosclerosis.

Musculoskeletal: There are no aggressive appearing lytic or blastic
lesions noted in the visualized portions of the skeleton.
IMPRESSION: 1. No findings to suggest interstitial lung disease.
2. Saber sheath trachea with mild diffuse bronchial wall thickening
with very mild centrilobular and paraseptal emphysema; imaging
findings suggestive of underlying COPD.
3. Aortic atherosclerosis, in addition to left main and 3 vessel
coronary artery disease. Please note that although the presence of
coronary artery calcium documents the presence of coronary artery
disease, the severity of this disease and any potential stenosis
cannot be assessed on this non-gated CT examination. Assessment for
potential risk factor modification, dietary therapy or pharmacologic
therapy may be warranted, if clinically indicated.

Aortic Atherosclerosis (FN1C4-8AG.G) and Emphysema (FN1C4-JW6.S).

## 2021-11-17 ENCOUNTER — Encounter (HOSPITAL_COMMUNITY)
Admission: RE | Admit: 2021-11-17 | Discharge: 2021-11-17 | Disposition: A | Discharge status: Home / Self Care | Payer: PPO | Source: Ambulatory Visit | Attending: Nephrology | Admitting: Nephrology

## 2021-11-17 ENCOUNTER — Other Ambulatory Visit: Payer: Self-pay

## 2021-11-17 ENCOUNTER — Encounter (HOSPITAL_COMMUNITY): Payer: PPO

## 2021-11-17 VITALS — BP 152/80 | HR 54

## 2021-11-17 DIAGNOSIS — D631 Anemia in chronic kidney disease: Secondary | ICD-10-CM | POA: Diagnosis not present

## 2021-11-17 DIAGNOSIS — N179 Acute kidney failure, unspecified: Secondary | ICD-10-CM | POA: Diagnosis not present

## 2021-11-17 LAB — RENAL FUNCTION PANEL
Albumin: 3.4 g/dL — ABNORMAL LOW (ref 3.5–5.0)
Anion gap: 6 (ref 5–15)
BUN: 37 mg/dL — ABNORMAL HIGH (ref 8–23)
CO2: 22 mmol/L (ref 22–32)
Calcium: 9.2 mg/dL (ref 8.9–10.3)
Chloride: 110 mmol/L (ref 98–111)
Creatinine, Ser: 3.75 mg/dL — ABNORMAL HIGH (ref 0.61–1.24)
GFR, Estimated: 17 mL/min — ABNORMAL LOW (ref >60)
Glucose, Bld: 97 mg/dL (ref 70–99)
Phosphorus: 4.5 mg/dL (ref 2.5–4.6)
Potassium: 4.9 mmol/L (ref 3.5–5.1)
Sodium: 138 mmol/L (ref 135–145)

## 2021-11-17 LAB — POCT HEMOGLOBIN-HEMACUE: Hemoglobin: 11.5 g/dL — ABNORMAL LOW (ref 13.0–17.0)

## 2021-11-17 MED ORDER — EPOETIN ALFA-EPBX 40000 UNIT/ML IJ SOLN
INTRAMUSCULAR | Status: AC
  Filled 2021-11-17: qty 1

## 2021-11-17 MED ORDER — EPOETIN ALFA-EPBX 40000 UNIT/ML IJ SOLN
30000.0000 [IU] | INTRAMUSCULAR | Status: DC
  Administered 2021-11-17: 30000 [IU] via SUBCUTANEOUS

## 2021-11-18 LAB — PTH, INTACT AND CALCIUM
Calcium, Total (PTH): 9.2 mg/dL (ref 8.6–10.2)
PTH: 66 pg/mL — ABNORMAL HIGH (ref 15–65)

## 2021-11-22 DIAGNOSIS — N057 Unspecified nephritic syndrome with diffuse crescentic glomerulonephritis: Secondary | ICD-10-CM | POA: Diagnosis not present

## 2021-11-22 DIAGNOSIS — D631 Anemia in chronic kidney disease: Secondary | ICD-10-CM | POA: Diagnosis not present

## 2021-11-22 DIAGNOSIS — N058 Unspecified nephritic syndrome with other morphologic changes: Secondary | ICD-10-CM | POA: Diagnosis not present

## 2021-11-22 DIAGNOSIS — N185 Chronic kidney disease, stage 5: Secondary | ICD-10-CM | POA: Diagnosis not present

## 2021-11-22 DIAGNOSIS — N2581 Secondary hyperparathyroidism of renal origin: Secondary | ICD-10-CM | POA: Diagnosis not present

## 2021-11-22 DIAGNOSIS — E872 Acidosis, unspecified: Secondary | ICD-10-CM | POA: Diagnosis not present

## 2021-11-22 DIAGNOSIS — I120 Hypertensive chronic kidney disease with stage 5 chronic kidney disease or end stage renal disease: Secondary | ICD-10-CM | POA: Diagnosis not present

## 2021-11-22 DIAGNOSIS — E875 Hyperkalemia: Secondary | ICD-10-CM | POA: Diagnosis not present

## 2021-12-01 ENCOUNTER — Encounter (HOSPITAL_COMMUNITY): Payer: PPO

## 2021-12-01 ENCOUNTER — Encounter (HOSPITAL_COMMUNITY)
Admission: RE | Admit: 2021-12-01 | Discharge: 2021-12-01 | Disposition: A | Discharge status: Home / Self Care | Payer: PPO | Source: Ambulatory Visit | Attending: Nephrology | Admitting: Nephrology

## 2021-12-01 ENCOUNTER — Other Ambulatory Visit: Payer: Self-pay

## 2021-12-01 VITALS — BP 147/65 | HR 50 | Temp 98.0°F

## 2021-12-01 DIAGNOSIS — N179 Acute kidney failure, unspecified: Secondary | ICD-10-CM | POA: Diagnosis not present

## 2021-12-01 LAB — IRON AND TIBC
Iron: 75 ug/dL (ref 45–182)
Saturation Ratios: 26 % (ref 17.9–39.5)
TIBC: 294 ug/dL (ref 250–450)
UIBC: 219 ug/dL

## 2021-12-01 LAB — FERRITIN: Ferritin: 214 ng/mL (ref 24–336)

## 2021-12-01 LAB — POCT HEMOGLOBIN-HEMACUE: Hemoglobin: 12.2 g/dL — ABNORMAL LOW (ref 13.0–17.0)

## 2021-12-01 MED ORDER — EPOETIN ALFA-EPBX 40000 UNIT/ML IJ SOLN: 30000.0000 [IU] | INTRAMUSCULAR | Status: DC

## 2021-12-01 MED ORDER — EPOETIN ALFA-EPBX 40000 UNIT/ML IJ SOLN
INTRAMUSCULAR | Status: AC
  Filled 2021-12-01: qty 1

## 2021-12-15 ENCOUNTER — Encounter (HOSPITAL_COMMUNITY)
Admission: RE | Admit: 2021-12-15 | Discharge: 2021-12-15 | Disposition: A | Discharge status: Home / Self Care | Payer: PPO | Source: Ambulatory Visit | Attending: Nephrology | Admitting: Nephrology

## 2021-12-15 ENCOUNTER — Other Ambulatory Visit: Payer: Self-pay

## 2021-12-15 VITALS — BP 164/70 | HR 54

## 2021-12-15 DIAGNOSIS — D631 Anemia in chronic kidney disease: Secondary | ICD-10-CM | POA: Diagnosis not present

## 2021-12-15 DIAGNOSIS — N179 Acute kidney failure, unspecified: Secondary | ICD-10-CM | POA: Insufficient documentation

## 2021-12-15 LAB — POCT HEMOGLOBIN-HEMACUE: Hemoglobin: 11.2 g/dL — ABNORMAL LOW (ref 13.0–17.0)

## 2021-12-15 MED ORDER — EPOETIN ALFA-EPBX 40000 UNIT/ML IJ SOLN
30000.0000 [IU] | INTRAMUSCULAR | Status: DC
  Administered 2021-12-15: 30000 [IU] via SUBCUTANEOUS

## 2021-12-15 MED ORDER — EPOETIN ALFA-EPBX 40000 UNIT/ML IJ SOLN
INTRAMUSCULAR | Status: AC
  Filled 2021-12-15: qty 1

## 2021-12-29 ENCOUNTER — Encounter (HOSPITAL_COMMUNITY)
Admission: RE | Admit: 2021-12-29 | Discharge: 2021-12-29 | Disposition: A | Discharge status: Home / Self Care | Payer: PPO | Source: Ambulatory Visit | Attending: Nephrology | Admitting: Nephrology

## 2021-12-29 ENCOUNTER — Other Ambulatory Visit: Payer: Self-pay

## 2021-12-29 VITALS — BP 144/74 | HR 64 | Temp 97.0°F | Resp 20

## 2021-12-29 DIAGNOSIS — N179 Acute kidney failure, unspecified: Secondary | ICD-10-CM | POA: Diagnosis not present

## 2021-12-29 LAB — POCT HEMOGLOBIN-HEMACUE: Hemoglobin: 11.4 g/dL — ABNORMAL LOW (ref 13.0–17.0)

## 2021-12-29 LAB — IRON AND TIBC
Iron: 109 ug/dL (ref 45–182)
Saturation Ratios: 33 % (ref 17.9–39.5)
TIBC: 326 ug/dL (ref 250–450)
UIBC: 217 ug/dL

## 2021-12-29 LAB — FERRITIN: Ferritin: 365 ng/mL — ABNORMAL HIGH (ref 24–336)

## 2021-12-29 MED ORDER — EPOETIN ALFA-EPBX 40000 UNIT/ML IJ SOLN
30000.0000 [IU] | INTRAMUSCULAR | Status: DC
  Administered 2021-12-29: 30000 [IU] via SUBCUTANEOUS

## 2021-12-29 MED ORDER — EPOETIN ALFA-EPBX 40000 UNIT/ML IJ SOLN
INTRAMUSCULAR | Status: AC
  Filled 2021-12-29: qty 1

## 2022-01-12 ENCOUNTER — Other Ambulatory Visit: Payer: Self-pay

## 2022-01-12 ENCOUNTER — Encounter (HOSPITAL_COMMUNITY)
Admission: RE | Admit: 2022-01-12 | Discharge: 2022-01-12 | Disposition: A | Discharge status: Home / Self Care | Payer: PPO | Source: Ambulatory Visit | Attending: Nephrology | Admitting: Nephrology

## 2022-01-12 VITALS — BP 155/73 | HR 58 | Temp 97.0°F | Resp 20

## 2022-01-12 DIAGNOSIS — D631 Anemia in chronic kidney disease: Secondary | ICD-10-CM | POA: Diagnosis not present

## 2022-01-12 DIAGNOSIS — N179 Acute kidney failure, unspecified: Secondary | ICD-10-CM | POA: Diagnosis not present

## 2022-01-12 LAB — POCT HEMOGLOBIN-HEMACUE: Hemoglobin: 11.1 g/dL — ABNORMAL LOW (ref 13.0–17.0)

## 2022-01-12 MED ORDER — EPOETIN ALFA-EPBX 40000 UNIT/ML IJ SOLN
INTRAMUSCULAR | Status: AC
  Filled 2022-01-12: qty 1

## 2022-01-12 MED ORDER — EPOETIN ALFA-EPBX 40000 UNIT/ML IJ SOLN
30000.0000 [IU] | INTRAMUSCULAR | Status: DC
  Administered 2022-01-12: 30000 [IU] via SUBCUTANEOUS

## 2022-01-26 ENCOUNTER — Encounter (HOSPITAL_COMMUNITY)
Admission: RE | Admit: 2022-01-26 | Discharge: 2022-01-26 | Disposition: A | Discharge status: Home / Self Care | Payer: PPO | Source: Ambulatory Visit | Attending: Nephrology | Admitting: Nephrology

## 2022-01-26 ENCOUNTER — Other Ambulatory Visit: Payer: Self-pay

## 2022-01-26 VITALS — BP 142/74 | HR 52

## 2022-01-26 DIAGNOSIS — N179 Acute kidney failure, unspecified: Secondary | ICD-10-CM

## 2022-01-26 LAB — IRON AND TIBC
Iron: 77 ug/dL (ref 45–182)
Saturation Ratios: 25 % (ref 17.9–39.5)
TIBC: 307 ug/dL (ref 250–450)
UIBC: 230 ug/dL

## 2022-01-26 LAB — POCT HEMOGLOBIN-HEMACUE: Hemoglobin: 12.6 g/dL — ABNORMAL LOW (ref 13.0–17.0)

## 2022-01-26 LAB — FERRITIN: Ferritin: 262 ng/mL (ref 24–336)

## 2022-01-26 MED ORDER — EPOETIN ALFA-EPBX 40000 UNIT/ML IJ SOLN: 30000.0000 [IU] | INTRAMUSCULAR | Status: DC

## 2022-02-09 ENCOUNTER — Encounter (HOSPITAL_COMMUNITY)
Admission: RE | Admit: 2022-02-09 | Discharge: 2022-02-09 | Disposition: A | Discharge status: Home / Self Care | Payer: PPO | Source: Ambulatory Visit | Attending: Nephrology | Admitting: Nephrology

## 2022-02-09 VITALS — BP 145/76 | HR 55 | Resp 20

## 2022-02-09 DIAGNOSIS — N185 Chronic kidney disease, stage 5: Secondary | ICD-10-CM | POA: Diagnosis not present

## 2022-02-09 DIAGNOSIS — N179 Acute kidney failure, unspecified: Secondary | ICD-10-CM | POA: Diagnosis not present

## 2022-02-09 LAB — POCT HEMOGLOBIN-HEMACUE: Hemoglobin: 12.2 g/dL — ABNORMAL LOW (ref 13.0–17.0)

## 2022-02-09 MED ORDER — EPOETIN ALFA-EPBX 40000 UNIT/ML IJ SOLN: 30000.0000 [IU] | INTRAMUSCULAR | Status: DC

## 2022-02-17 DIAGNOSIS — N058 Unspecified nephritic syndrome with other morphologic changes: Secondary | ICD-10-CM | POA: Diagnosis not present

## 2022-02-17 DIAGNOSIS — E875 Hyperkalemia: Secondary | ICD-10-CM | POA: Diagnosis not present

## 2022-02-17 DIAGNOSIS — I770 Arteriovenous fistula, acquired: Secondary | ICD-10-CM | POA: Diagnosis not present

## 2022-02-17 DIAGNOSIS — I120 Hypertensive chronic kidney disease with stage 5 chronic kidney disease or end stage renal disease: Secondary | ICD-10-CM | POA: Diagnosis not present

## 2022-02-17 DIAGNOSIS — E872 Acidosis, unspecified: Secondary | ICD-10-CM | POA: Diagnosis not present

## 2022-02-17 DIAGNOSIS — N2581 Secondary hyperparathyroidism of renal origin: Secondary | ICD-10-CM | POA: Diagnosis not present

## 2022-02-17 DIAGNOSIS — N057 Unspecified nephritic syndrome with diffuse crescentic glomerulonephritis: Secondary | ICD-10-CM | POA: Diagnosis not present

## 2022-02-17 DIAGNOSIS — N185 Chronic kidney disease, stage 5: Secondary | ICD-10-CM | POA: Diagnosis not present

## 2022-02-17 DIAGNOSIS — D631 Anemia in chronic kidney disease: Secondary | ICD-10-CM | POA: Diagnosis not present

## 2022-02-23 ENCOUNTER — Encounter (HOSPITAL_COMMUNITY)
Admission: RE | Admit: 2022-02-23 | Discharge: 2022-02-23 | Disposition: A | Discharge status: Home / Self Care | Payer: PPO | Source: Ambulatory Visit | Attending: Nephrology | Admitting: Nephrology

## 2022-02-23 VITALS — BP 146/75 | HR 56 | Temp 97.6°F

## 2022-02-23 DIAGNOSIS — D638 Anemia in other chronic diseases classified elsewhere: Secondary | ICD-10-CM | POA: Insufficient documentation

## 2022-02-23 DIAGNOSIS — N179 Acute kidney failure, unspecified: Secondary | ICD-10-CM | POA: Diagnosis not present

## 2022-02-23 LAB — IRON AND TIBC
Iron: 51 ug/dL (ref 45–182)
Saturation Ratios: 17 % — ABNORMAL LOW (ref 17.9–39.5)
TIBC: 300 ug/dL (ref 250–450)
UIBC: 249 ug/dL

## 2022-02-23 LAB — FERRITIN: Ferritin: 449 ng/mL — ABNORMAL HIGH (ref 24–336)

## 2022-02-23 LAB — POCT HEMOGLOBIN-HEMACUE: Hemoglobin: 10.7 g/dL — ABNORMAL LOW (ref 13.0–17.0)

## 2022-02-23 MED ORDER — EPOETIN ALFA-EPBX 40000 UNIT/ML IJ SOLN
30000.0000 [IU] | INTRAMUSCULAR | Status: DC
  Administered 2022-02-23: 30000 [IU] via SUBCUTANEOUS

## 2022-02-23 MED ORDER — EPOETIN ALFA-EPBX 40000 UNIT/ML IJ SOLN
INTRAMUSCULAR | Status: AC
  Filled 2022-02-23: qty 1

## 2022-03-03 DIAGNOSIS — Z125 Encounter for screening for malignant neoplasm of prostate: Secondary | ICD-10-CM | POA: Diagnosis not present

## 2022-03-03 DIAGNOSIS — J439 Emphysema, unspecified: Secondary | ICD-10-CM | POA: Diagnosis not present

## 2022-03-03 DIAGNOSIS — Z0000 Encounter for general adult medical examination without abnormal findings: Secondary | ICD-10-CM | POA: Diagnosis not present

## 2022-03-03 DIAGNOSIS — D638 Anemia in other chronic diseases classified elsewhere: Secondary | ICD-10-CM | POA: Diagnosis not present

## 2022-03-03 DIAGNOSIS — I7141 Pararenal abdominal aortic aneurysm, without rupture: Secondary | ICD-10-CM | POA: Diagnosis not present

## 2022-03-03 DIAGNOSIS — N058 Unspecified nephritic syndrome with other morphologic changes: Secondary | ICD-10-CM | POA: Diagnosis not present

## 2022-03-03 DIAGNOSIS — E099 Drug or chemical induced diabetes mellitus without complications: Secondary | ICD-10-CM | POA: Diagnosis not present

## 2022-03-03 DIAGNOSIS — N185 Chronic kidney disease, stage 5: Secondary | ICD-10-CM | POA: Diagnosis not present

## 2022-03-03 DIAGNOSIS — I358 Other nonrheumatic aortic valve disorders: Secondary | ICD-10-CM | POA: Diagnosis not present

## 2022-03-03 DIAGNOSIS — I10 Essential (primary) hypertension: Secondary | ICD-10-CM | POA: Diagnosis not present

## 2022-03-09 ENCOUNTER — Encounter (HOSPITAL_COMMUNITY)
Admission: RE | Admit: 2022-03-09 | Discharge: 2022-03-09 | Disposition: A | Discharge status: Home / Self Care | Payer: PPO | Source: Ambulatory Visit | Attending: Nephrology | Admitting: Nephrology

## 2022-03-09 VITALS — BP 156/73 | HR 55 | Temp 97.2°F | Resp 18

## 2022-03-09 DIAGNOSIS — N179 Acute kidney failure, unspecified: Secondary | ICD-10-CM | POA: Diagnosis not present

## 2022-03-09 LAB — POCT HEMOGLOBIN-HEMACUE: Hemoglobin: 10.7 g/dL — ABNORMAL LOW (ref 13.0–17.0)

## 2022-03-09 MED ORDER — EPOETIN ALFA-EPBX 40000 UNIT/ML IJ SOLN: 30000.0000 [IU] | INTRAMUSCULAR | Status: DC

## 2022-03-09 MED ORDER — EPOETIN ALFA-EPBX 40000 UNIT/ML IJ SOLN
INTRAMUSCULAR | Status: AC
Start: 2022-03-09 — End: 2022-03-09
  Administered 2022-03-09: 30000 [IU] via SUBCUTANEOUS
  Filled 2022-03-09: qty 1

## 2022-03-18 DIAGNOSIS — I358 Other nonrheumatic aortic valve disorders: Secondary | ICD-10-CM | POA: Diagnosis not present

## 2022-03-18 DIAGNOSIS — I714 Abdominal aortic aneurysm, without rupture, unspecified: Secondary | ICD-10-CM | POA: Diagnosis not present

## 2022-03-23 ENCOUNTER — Encounter (HOSPITAL_COMMUNITY)
Admission: RE | Admit: 2022-03-23 | Discharge: 2022-03-23 | Disposition: A | Discharge status: Home / Self Care | Payer: PPO | Source: Ambulatory Visit | Attending: Nephrology | Admitting: Nephrology

## 2022-03-23 VITALS — BP 136/71 | HR 56 | Temp 97.9°F | Resp 18

## 2022-03-23 DIAGNOSIS — D631 Anemia in chronic kidney disease: Secondary | ICD-10-CM | POA: Insufficient documentation

## 2022-03-23 DIAGNOSIS — N179 Acute kidney failure, unspecified: Secondary | ICD-10-CM | POA: Insufficient documentation

## 2022-03-23 LAB — FERRITIN: Ferritin: 436 ng/mL — ABNORMAL HIGH (ref 24–336)

## 2022-03-23 LAB — IRON AND TIBC
Iron: 100 ug/dL (ref 45–182)
Saturation Ratios: 34 % (ref 17.9–39.5)
TIBC: 298 ug/dL (ref 250–450)
UIBC: 198 ug/dL

## 2022-03-23 LAB — POCT HEMOGLOBIN-HEMACUE: Hemoglobin: 11.3 g/dL — ABNORMAL LOW (ref 13.0–17.0)

## 2022-03-23 MED ORDER — EPOETIN ALFA-EPBX 40000 UNIT/ML IJ SOLN
30000.0000 [IU] | INTRAMUSCULAR | Status: DC
  Administered 2022-03-23: 30000 [IU] via SUBCUTANEOUS

## 2022-03-28 NOTE — Progress Notes (Signed)
[facility] [place] ? ?ASSESSMENT / PLAN: ?Taylor King is a 71 y.o. male with a infrarenal abdominal aortic aneurysm measuring 58m by Duplex.  Counseled the patient that his rupture risk is very low.  I counseled him about the rationale for continued surveillance.  I counseled him about the threshold for intervention at 586m ? ?Recommend the following to reduce the risk of major adverse cardiac / limb events. ? ?Complete cessation from all tobacco products. ?Excellent blood glucose control with goal A1c < 7%. ?Blood pressure control with goal blood pressure < 140/90 mmHg. ?Excellent lipid reduction therapy with goal LDL-C <100 mg/dL. ?Aspirin '81mg'$  PO QD.  ?Atorvastatin 40-'80mg'$  PO QD (or other "high intensity" statin therapy). ? ?Follow-up with me in 1 year with repeat aortic duplex. ? ?CHIEF COMPLAINT: Abdominal aortic aneurysm ? ?HISTORY OF PRESENT ILLNESS: ?Taylor King a [age].o. male referred to clinic for enlarging abdominal aortic aneurysm.  He has been under the care of Dr. PoDelfina Redwoodwho has been monitoring this.  The aneurysm recently grew to 40 mm.  The patient has many excellent questions about surveillance.  His wife, and her family have a strong personal history of aneurysms, she has multiple excellent questions as well.  I reviewed these all to the best of my ability.  The patient previously had dialysis access created for him by my partner, Dr. FiOneida Alarthankfully he has not needed to start dialysis yet.  ? ?Past Medical History:  ?Diagnosis Date  ? Arthritis   ? Chronic back pain   ? Chronic kidney disease   ? COPD (chronic obstructive pulmonary disease) ([facility]  ? Diabetes mellitus without complication ([facility]  ? no meds currently  ? Diverticulitis   ? Dyspnea   ? with exertion  ? GERD (gastroesophageal reflux disease)   ? occ -no meds, diet controlled  ? History of blood transfusion   ? History of kidney stones   ? passed stones - no surgery  ? Iron deficiency   ? blood  transfusions in 09/2020 and 11/2020 - currently getting iron fusions EOW  ? Peripheral vascular disease ([facility]  ? aaa 39 mm 02-14-17 epic [place]? ? ?Past Surgical History:  ?Procedure Laterality Date  ? adenoids removed   age [age]? ANTERIOR LAT LUMBAR FUSION N/A 03/26/2020  ? Procedure: ANTERIOR LATERAL LUMBAR FUSION (XLIF) LUMBAR TWO-THREE WITH POSTERIOR SPINAL FUSION INTERBODY LUMBAR TWO-THREE;  Surgeon: BrMelina SchoolsMD;  [facility];  Laterality: N/A;  4 hrs ?Left tap block with exparel  ? AV FISTULA PLACEMENT Left 05/10/2021  ? Procedure: LEFT BRACHIOCEPHALIC ARTERIOVENOUS (AV) FISTULA CREATION;  Surgeon: FiElam DutchMD;  [facility];  Service: Vascular;  Laterality: Left;  ? COLONOSCOPY WITH PROPOFOL N/A 03/07/2017  ? Procedure: COLONOSCOPY WITH PROPOFOL;  Surgeon: MaGarlan FairMD;  [facility];  Service: Endoscopy;  Laterality: N/A;  ? colonscopy    ? x 3  ? KNEE ARTHROSCOPY  [id number]  ? left knee  ? TONSILLECTOMY  age [age]? TOTAL KNEE ARTHROPLASTY  11/25/2011  ? Procedure: TOTAL KNEE ARTHROPLASTY;  Surgeon: MaNewt MinionMD;  [facility];  Laterality: Left;  Left Total Knee Arthroplasty  ? ? ?Family History  ?Problem Relation Age of Onset  ? Emphysema Father   ? ? ?Social History  ? ?Socioeconomic History  ? Marital status: Married  ?  Spouse name: Not  on file  ? Number of children: Not on file  ? Years of education: Not on file  ? Highest education level: Not on file  ?Occupational History  ? Not on file  ?Tobacco Use  ? Smoking status: Former  ?  Packs/day: 0.25  ?  Years: 30.00  ?  Pack years: 7.50  ?  Types: Cigarettes  ?  Quit date: 12/25/2013  ?  Years since quitting: 8.2  ? Smokeless tobacco: Never  ?Vaping Use  ? Vaping Use: Never used  ?Substance and Sexual Activity  ? Alcohol use: No  ? Drug use: No  ? Sexual activity: Not on file  ?Other Topics Concern  ? Not on file  ?Social History Narrative  ? Not on file  ? ?Social  Determinants of Health  ? ?Financial Resource Strain: Not on file  ?Food Insecurity: Not on file  ?Transportation Needs: Not on file  ?Physical Activity: Not on file  ?Stress: Not on file  ?Social Connections: Not on file  ?Intimate Partner Violence: Not on file  ? ? ?No Known Allergies ? ?Current Outpatient Medications  ?Medication Sig Dispense Refill  ? atenolol (TENORMIN) 50 MG tablet Take 50 mg by mouth daily.    ? Cholecalciferol (VITAMIN D) 50 MCG (2000 UT) CAPS Take 2,000 Units by mouth daily.    ? epoetin alfa-epbx (RETACRIT) 2000 UNIT/ML injection See admin instructions.    ? SPIRIVA RESPIMAT 2.5 MCG/ACT AERS INHALE 2 PUFFS BY MOUTH INTO THE LUNGS DAILY 4 g 6  ? vitamin B-12 (CYANOCOBALAMIN) 1000 MCG tablet Take 1,000 mcg by mouth daily.    ? ?No current facility-administered medications for this visit.  ? ? ?PHYSICAL EXAM ?Vitals:  ? 03/29/22 1454  ?BP: (!) 148/73  ?Pulse: (!) 55  ?Resp: 20  ?Temp: 98 ?F (36.7 ?C)  ?SpO2: 97%  ?Weight: 183 lb 9.6 oz (83.3 kg)  ?Height: '5\' 9"'$  (1.753 m)  ? ? ?Constitutional: Well-appearing man in no acute distress ?Cardiac: Regular rate and rhythm.  ?Respiratory:  unlabored. ?Abdominal:  soft, non-tender, non-distended.  ?Peripheral vascular: tortuous brachiocephalic arteriovenous fistula on the left. ? ?PERTINENT LABORATORY AND RADIOLOGIC DATA ? ?Most recent CBC ? ?  Latest Ref Rng & Units 03/23/2022  ?  2:04 PM 03/09/2022  ?  2:15 PM 02/23/2022  ?  2:09 PM  ?CBC  ?Hemoglobin 13.0 - 17.0 g/dL 11.3   10.7   10.7    ?  ? ?Most recent CMP ? ?  Latest Ref Rng & Units 11/17/2021  ?  1:34 PM 05/10/2021  ?  6:30 AM 11/26/2020  ?  8:47 AM  ?CMP  ?Glucose 70 - 99 mg/dL 97   127   85    ?BUN 8 - 23 mg/dL 37   50   61    ?Creatinine 0.61 - 1.24 mg/dL 3.75   4.50   4.26    ?Sodium 135 - 145 mmol/L 138   144   142    ?Potassium 3.5 - 5.1 mmol/L 4.9   4.6   4.1    ?Chloride 98 - 111 mmol/L 110   114   104    ?CO2 22 - 32 mmol/L 22    24    ?Calcium 8.9 - 10.3 mg/dL 9.2    ? 9.2    8.3    ?Total  Protein 6.5 - 8.1 g/dL   5.8    ?Total Bilirubin 0.3 - 1.2 mg/dL   0.3    ?Alkaline  Phos 38 - 126 U/L   48    ?AST 15 - 41 U/L   17    ?ALT 0 - 44 U/L   23    ? ?Outside duplex shows 2m infrarenal aortic aneurysm ? ?TYevonne Aline HStanford Breed MD ?[facility]?Office Phone Number: [phone]?03/29/2022 4:29 PM ? ?Total time spent on preparing this encounter including chart review, data review, collecting history, examining the patient, coordinating care for this new patient, 60 minutes. ? ?Portions of this report may have been transcribed using voice recognition software.  Every effort has been made to ensure accuracy; however, inadvertent computerized transcription errors may still be present. ? ? ? ?

## 2022-03-29 ENCOUNTER — Encounter: Payer: Self-pay | Admitting: Vascular Surgery

## 2022-03-29 ENCOUNTER — Ambulatory Visit (INDEPENDENT_AMBULATORY_CARE_PROVIDER_SITE_OTHER): Payer: PPO | Admitting: Vascular Surgery

## 2022-03-29 VITALS — BP 148/73 | HR 55 | Temp 98.0°F | Resp 20 | Ht 69.0 in | Wt 183.6 lb

## 2022-03-29 DIAGNOSIS — I7143 Infrarenal abdominal aortic aneurysm, without rupture: Secondary | ICD-10-CM | POA: Diagnosis not present

## 2022-04-01 DIAGNOSIS — I2510 Atherosclerotic heart disease of native coronary artery without angina pectoris: Secondary | ICD-10-CM | POA: Diagnosis not present

## 2022-04-01 DIAGNOSIS — I2584 Coronary atherosclerosis due to calcified coronary lesion: Secondary | ICD-10-CM | POA: Diagnosis not present

## 2022-04-01 DIAGNOSIS — Z01810 Encounter for preprocedural cardiovascular examination: Secondary | ICD-10-CM | POA: Diagnosis not present

## 2022-04-01 DIAGNOSIS — J349 Unspecified disorder of nose and nasal sinuses: Secondary | ICD-10-CM | POA: Diagnosis not present

## 2022-04-01 DIAGNOSIS — N186 End stage renal disease: Secondary | ICD-10-CM | POA: Diagnosis not present

## 2022-04-01 DIAGNOSIS — Z992 Dependence on renal dialysis: Secondary | ICD-10-CM | POA: Diagnosis not present

## 2022-04-01 DIAGNOSIS — R9430 Abnormal result of cardiovascular function study, unspecified: Secondary | ICD-10-CM | POA: Diagnosis not present

## 2022-04-01 DIAGNOSIS — I700 Atherosclerosis of aorta: Secondary | ICD-10-CM | POA: Diagnosis not present

## 2022-04-06 ENCOUNTER — Encounter (HOSPITAL_COMMUNITY)
Admission: RE | Admit: 2022-04-06 | Discharge: 2022-04-06 | Disposition: A | Discharge status: Home / Self Care | Payer: PPO | Source: Ambulatory Visit | Attending: Nephrology | Admitting: Nephrology

## 2022-04-06 VITALS — BP 156/68 | HR 55 | Temp 97.2°F | Resp 18

## 2022-04-06 DIAGNOSIS — N179 Acute kidney failure, unspecified: Secondary | ICD-10-CM | POA: Diagnosis not present

## 2022-04-06 LAB — POCT HEMOGLOBIN-HEMACUE: Hemoglobin: 11.9 g/dL — ABNORMAL LOW (ref 13.0–17.0)

## 2022-04-06 MED ORDER — EPOETIN ALFA-EPBX 40000 UNIT/ML IJ SOLN
INTRAMUSCULAR | Status: AC
  Filled 2022-04-06: qty 1

## 2022-04-06 MED ORDER — EPOETIN ALFA-EPBX 40000 UNIT/ML IJ SOLN
30000.0000 [IU] | INTRAMUSCULAR | Status: DC
  Administered 2022-04-06: 30000 [IU] via SUBCUTANEOUS

## 2022-04-18 DIAGNOSIS — N185 Chronic kidney disease, stage 5: Secondary | ICD-10-CM | POA: Diagnosis not present

## 2022-04-20 ENCOUNTER — Encounter (HOSPITAL_COMMUNITY)
Admission: RE | Admit: 2022-04-20 | Discharge: 2022-04-20 | Disposition: A | Discharge status: Home / Self Care | Payer: PPO | Source: Ambulatory Visit | Attending: Nephrology | Admitting: Nephrology

## 2022-04-20 VITALS — BP 168/73 | HR 52 | Temp 96.6°F | Resp 20

## 2022-04-20 DIAGNOSIS — N179 Acute kidney failure, unspecified: Secondary | ICD-10-CM | POA: Diagnosis not present

## 2022-04-20 DIAGNOSIS — D631 Anemia in chronic kidney disease: Secondary | ICD-10-CM | POA: Diagnosis not present

## 2022-04-20 LAB — IRON AND TIBC
Iron: 88 ug/dL (ref 45–182)
Saturation Ratios: 29 % (ref 17.9–39.5)
TIBC: 301 ug/dL (ref 250–450)
UIBC: 213 ug/dL

## 2022-04-20 LAB — FERRITIN: Ferritin: 337 ng/mL — ABNORMAL HIGH (ref 24–336)

## 2022-04-20 MED ORDER — EPOETIN ALFA-EPBX 40000 UNIT/ML IJ SOLN: 30000.0000 [IU] | INTRAMUSCULAR | Status: DC

## 2022-04-21 LAB — POCT HEMOGLOBIN-HEMACUE: Hemoglobin: 12.2 g/dL — ABNORMAL LOW (ref 13.0–17.0)

## 2022-04-28 DIAGNOSIS — I120 Hypertensive chronic kidney disease with stage 5 chronic kidney disease or end stage renal disease: Secondary | ICD-10-CM | POA: Diagnosis not present

## 2022-04-28 DIAGNOSIS — I770 Arteriovenous fistula, acquired: Secondary | ICD-10-CM | POA: Diagnosis not present

## 2022-04-28 DIAGNOSIS — E872 Acidosis, unspecified: Secondary | ICD-10-CM | POA: Diagnosis not present

## 2022-04-28 DIAGNOSIS — N057 Unspecified nephritic syndrome with diffuse crescentic glomerulonephritis: Secondary | ICD-10-CM | POA: Diagnosis not present

## 2022-04-28 DIAGNOSIS — N058 Unspecified nephritic syndrome with other morphologic changes: Secondary | ICD-10-CM | POA: Diagnosis not present

## 2022-04-28 DIAGNOSIS — N2581 Secondary hyperparathyroidism of renal origin: Secondary | ICD-10-CM | POA: Diagnosis not present

## 2022-04-28 DIAGNOSIS — D631 Anemia in chronic kidney disease: Secondary | ICD-10-CM | POA: Diagnosis not present

## 2022-04-28 DIAGNOSIS — N185 Chronic kidney disease, stage 5: Secondary | ICD-10-CM | POA: Diagnosis not present

## 2022-04-28 DIAGNOSIS — E875 Hyperkalemia: Secondary | ICD-10-CM | POA: Diagnosis not present

## 2022-05-04 ENCOUNTER — Encounter (HOSPITAL_COMMUNITY)
Admission: RE | Admit: 2022-05-04 | Discharge: 2022-05-04 | Disposition: A | Discharge status: Home / Self Care | Payer: PPO | Source: Ambulatory Visit | Attending: Nephrology | Admitting: Nephrology

## 2022-05-04 VITALS — BP 155/68 | HR 56 | Temp 96.8°F | Resp 18

## 2022-05-04 DIAGNOSIS — N179 Acute kidney failure, unspecified: Secondary | ICD-10-CM | POA: Diagnosis not present

## 2022-05-04 LAB — POCT HEMOGLOBIN-HEMACUE: Hemoglobin: 10.9 g/dL — ABNORMAL LOW (ref 13.0–17.0)

## 2022-05-04 MED ORDER — EPOETIN ALFA-EPBX 40000 UNIT/ML IJ SOLN
30000.0000 [IU] | INTRAMUSCULAR | Status: DC
  Administered 2022-05-04: 30000 [IU] via SUBCUTANEOUS

## 2022-05-04 MED ORDER — EPOETIN ALFA-EPBX 40000 UNIT/ML IJ SOLN
INTRAMUSCULAR | Status: AC
  Filled 2022-05-04: qty 1

## 2022-05-10 DIAGNOSIS — M25562 Pain in left knee: Secondary | ICD-10-CM | POA: Diagnosis not present

## 2022-05-16 DIAGNOSIS — M2352 Chronic instability of knee, left knee: Secondary | ICD-10-CM | POA: Diagnosis not present

## 2022-05-18 ENCOUNTER — Encounter (HOSPITAL_COMMUNITY)
Admission: RE | Admit: 2022-05-18 | Discharge: 2022-05-18 | Disposition: A | Discharge status: Home / Self Care | Payer: PPO | Source: Ambulatory Visit | Attending: Nephrology | Admitting: Nephrology

## 2022-05-18 VITALS — BP 135/72 | HR 54

## 2022-05-18 DIAGNOSIS — D631 Anemia in chronic kidney disease: Secondary | ICD-10-CM | POA: Insufficient documentation

## 2022-05-18 DIAGNOSIS — N179 Acute kidney failure, unspecified: Secondary | ICD-10-CM | POA: Insufficient documentation

## 2022-05-18 LAB — FERRITIN: Ferritin: 359 ng/mL — ABNORMAL HIGH (ref 24–336)

## 2022-05-18 LAB — IRON AND TIBC
Iron: 75 ug/dL (ref 45–182)
Saturation Ratios: 27 % (ref 17.9–39.5)
TIBC: 276 ug/dL (ref 250–450)
UIBC: 201 ug/dL

## 2022-05-18 LAB — POCT HEMOGLOBIN-HEMACUE: Hemoglobin: 11 g/dL — ABNORMAL LOW (ref 13.0–17.0)

## 2022-05-18 MED ORDER — EPOETIN ALFA-EPBX 40000 UNIT/ML IJ SOLN
INTRAMUSCULAR | Status: AC
  Filled 2022-05-18: qty 1

## 2022-05-18 MED ORDER — EPOETIN ALFA-EPBX 40000 UNIT/ML IJ SOLN
30000.0000 [IU] | INTRAMUSCULAR | Status: DC
  Administered 2022-05-18: 30000 [IU] via SUBCUTANEOUS

## 2022-05-19 ENCOUNTER — Encounter (HOSPITAL_COMMUNITY): Payer: PPO

## 2022-05-24 DIAGNOSIS — M2352 Chronic instability of knee, left knee: Secondary | ICD-10-CM | POA: Diagnosis not present

## 2022-05-26 DIAGNOSIS — M2352 Chronic instability of knee, left knee: Secondary | ICD-10-CM | POA: Diagnosis not present

## 2022-06-01 ENCOUNTER — Encounter (HOSPITAL_COMMUNITY)
Admission: RE | Admit: 2022-06-01 | Discharge: 2022-06-01 | Disposition: A | Discharge status: Home / Self Care | Payer: PPO | Source: Ambulatory Visit | Attending: Nephrology | Admitting: Nephrology

## 2022-06-01 VITALS — BP 171/71 | HR 49 | Temp 97.5°F | Resp 18

## 2022-06-01 DIAGNOSIS — N179 Acute kidney failure, unspecified: Secondary | ICD-10-CM

## 2022-06-01 DIAGNOSIS — M2352 Chronic instability of knee, left knee: Secondary | ICD-10-CM | POA: Diagnosis not present

## 2022-06-01 LAB — POCT HEMOGLOBIN-HEMACUE: Hemoglobin: 11.9 g/dL — ABNORMAL LOW (ref 13.0–17.0)

## 2022-06-01 MED ORDER — EPOETIN ALFA-EPBX 40000 UNIT/ML IJ SOLN
30000.0000 [IU] | INTRAMUSCULAR | Status: DC
  Administered 2022-06-01: 30000 [IU] via SUBCUTANEOUS

## 2022-06-01 MED ORDER — EPOETIN ALFA-EPBX 40000 UNIT/ML IJ SOLN
INTRAMUSCULAR | Status: AC
  Filled 2022-06-01: qty 1

## 2022-06-07 DIAGNOSIS — M2352 Chronic instability of knee, left knee: Secondary | ICD-10-CM | POA: Diagnosis not present

## 2022-06-08 ENCOUNTER — Encounter: Payer: Self-pay | Admitting: Hematology and Oncology

## 2022-06-08 ENCOUNTER — Encounter (HOSPITAL_COMMUNITY): Payer: Self-pay

## 2022-06-09 DIAGNOSIS — M2352 Chronic instability of knee, left knee: Secondary | ICD-10-CM | POA: Diagnosis not present

## 2022-06-14 DIAGNOSIS — M2352 Chronic instability of knee, left knee: Secondary | ICD-10-CM | POA: Diagnosis not present

## 2022-06-15 ENCOUNTER — Encounter (HOSPITAL_COMMUNITY)
Admission: RE | Admit: 2022-06-15 | Discharge: 2022-06-15 | Disposition: A | Discharge status: Home / Self Care | Payer: PPO | Source: Ambulatory Visit | Attending: Nephrology | Admitting: Nephrology

## 2022-06-15 VITALS — BP 166/73 | HR 51 | Temp 97.4°F | Resp 18

## 2022-06-15 DIAGNOSIS — N179 Acute kidney failure, unspecified: Secondary | ICD-10-CM | POA: Insufficient documentation

## 2022-06-15 DIAGNOSIS — D631 Anemia in chronic kidney disease: Secondary | ICD-10-CM | POA: Diagnosis not present

## 2022-06-15 DIAGNOSIS — N185 Chronic kidney disease, stage 5: Secondary | ICD-10-CM | POA: Insufficient documentation

## 2022-06-15 LAB — RENAL FUNCTION PANEL
Albumin: 3.5 g/dL (ref 3.5–5.0)
Anion gap: 6 (ref 5–15)
BUN: 38 mg/dL — ABNORMAL HIGH (ref 8–23)
CO2: 24 mmol/L (ref 22–32)
Calcium: 9.3 mg/dL (ref 8.9–10.3)
Chloride: 111 mmol/L (ref 98–111)
Creatinine, Ser: 4.51 mg/dL — ABNORMAL HIGH (ref 0.61–1.24)
GFR, Estimated: 13 mL/min — ABNORMAL LOW (ref >60)
Glucose, Bld: 121 mg/dL — ABNORMAL HIGH (ref 70–99)
Phosphorus: 4 mg/dL (ref 2.5–4.6)
Potassium: 4.9 mmol/L (ref 3.5–5.1)
Sodium: 141 mmol/L (ref 135–145)

## 2022-06-15 LAB — FERRITIN: Ferritin: 315 ng/mL (ref 24–336)

## 2022-06-15 LAB — IRON AND TIBC
Iron: 89 ug/dL (ref 45–182)
Saturation Ratios: 29 % (ref 17.9–39.5)
TIBC: 304 ug/dL (ref 250–450)
UIBC: 215 ug/dL

## 2022-06-15 LAB — POCT HEMOGLOBIN-HEMACUE: Hemoglobin: 12 g/dL — ABNORMAL LOW (ref 13.0–17.0)

## 2022-06-15 MED ORDER — CLONIDINE HCL 0.1 MG PO TABS: 0.1000 mg | ORAL_TABLET | Freq: Once | ORAL | Status: DC | PRN

## 2022-06-15 MED ORDER — EPOETIN ALFA-EPBX 40000 UNIT/ML IJ SOLN: 30000.0000 [IU] | INTRAMUSCULAR | Status: DC

## 2022-06-16 DIAGNOSIS — M2352 Chronic instability of knee, left knee: Secondary | ICD-10-CM | POA: Diagnosis not present

## 2022-06-16 LAB — PTH, INTACT AND CALCIUM
Calcium, Total (PTH): 9.5 mg/dL (ref 8.6–10.2)
PTH: 89 pg/mL — ABNORMAL HIGH (ref 15–65)

## 2022-06-23 DIAGNOSIS — M2352 Chronic instability of knee, left knee: Secondary | ICD-10-CM | POA: Diagnosis not present

## 2022-06-27 DIAGNOSIS — E875 Hyperkalemia: Secondary | ICD-10-CM | POA: Diagnosis not present

## 2022-06-27 DIAGNOSIS — N2581 Secondary hyperparathyroidism of renal origin: Secondary | ICD-10-CM | POA: Diagnosis not present

## 2022-06-27 DIAGNOSIS — I120 Hypertensive chronic kidney disease with stage 5 chronic kidney disease or end stage renal disease: Secondary | ICD-10-CM | POA: Diagnosis not present

## 2022-06-27 DIAGNOSIS — I770 Arteriovenous fistula, acquired: Secondary | ICD-10-CM | POA: Diagnosis not present

## 2022-06-27 DIAGNOSIS — N185 Chronic kidney disease, stage 5: Secondary | ICD-10-CM | POA: Diagnosis not present

## 2022-06-27 DIAGNOSIS — D631 Anemia in chronic kidney disease: Secondary | ICD-10-CM | POA: Diagnosis not present

## 2022-06-27 DIAGNOSIS — E872 Acidosis, unspecified: Secondary | ICD-10-CM | POA: Diagnosis not present

## 2022-06-28 ENCOUNTER — Other Ambulatory Visit (HOSPITAL_COMMUNITY): Payer: Self-pay | Admitting: *Deleted

## 2022-06-28 DIAGNOSIS — M2352 Chronic instability of knee, left knee: Secondary | ICD-10-CM | POA: Diagnosis not present

## 2022-06-29 ENCOUNTER — Encounter (HOSPITAL_COMMUNITY)
Admission: RE | Admit: 2022-06-29 | Discharge: 2022-06-29 | Disposition: A | Discharge status: Home / Self Care | Payer: PPO | Source: Ambulatory Visit | Attending: Nephrology | Admitting: Nephrology

## 2022-06-29 VITALS — BP 159/68 | HR 51 | Temp 97.5°F | Resp 18

## 2022-06-29 DIAGNOSIS — N179 Acute kidney failure, unspecified: Secondary | ICD-10-CM | POA: Diagnosis not present

## 2022-06-29 LAB — POCT HEMOGLOBIN-HEMACUE: Hemoglobin: 10.6 g/dL — ABNORMAL LOW (ref 13.0–17.0)

## 2022-06-29 MED ORDER — EPOETIN ALFA-EPBX 40000 UNIT/ML IJ SOLN: 25000.0000 [IU] | INTRAMUSCULAR | Status: DC

## 2022-06-29 MED ORDER — EPOETIN ALFA-EPBX 40000 UNIT/ML IJ SOLN
INTRAMUSCULAR | Status: AC
  Administered 2022-06-29: 25000 [IU] via SUBCUTANEOUS
  Filled 2022-06-29: qty 1

## 2022-07-11 DIAGNOSIS — H2513 Age-related nuclear cataract, bilateral: Secondary | ICD-10-CM | POA: Diagnosis not present

## 2022-07-13 ENCOUNTER — Encounter (HOSPITAL_COMMUNITY)
Admission: RE | Admit: 2022-07-13 | Discharge: 2022-07-13 | Disposition: A | Discharge status: Home / Self Care | Payer: PPO | Source: Ambulatory Visit | Attending: Nephrology | Admitting: Nephrology

## 2022-07-13 VITALS — BP 156/76 | HR 55 | Temp 97.4°F | Resp 16

## 2022-07-13 DIAGNOSIS — N179 Acute kidney failure, unspecified: Secondary | ICD-10-CM

## 2022-07-13 MED ORDER — EPOETIN ALFA-EPBX 40000 UNIT/ML IJ SOLN
INTRAMUSCULAR | Status: AC
  Administered 2022-07-13: 25000 [IU] via SUBCUTANEOUS
  Filled 2022-07-13: qty 1

## 2022-07-13 MED ORDER — EPOETIN ALFA-EPBX 40000 UNIT/ML IJ SOLN: 25000.0000 [IU] | INTRAMUSCULAR | Status: DC

## 2022-07-19 DIAGNOSIS — Z86010 Personal history of colonic polyps: Secondary | ICD-10-CM | POA: Diagnosis not present

## 2022-07-19 DIAGNOSIS — Z09 Encounter for follow-up examination after completed treatment for conditions other than malignant neoplasm: Secondary | ICD-10-CM | POA: Diagnosis not present

## 2022-07-26 DIAGNOSIS — I10 Essential (primary) hypertension: Secondary | ICD-10-CM | POA: Diagnosis not present

## 2022-07-27 ENCOUNTER — Encounter (HOSPITAL_COMMUNITY): Payer: PPO

## 2022-07-27 ENCOUNTER — Encounter (HOSPITAL_COMMUNITY)
Admission: RE | Admit: 2022-07-27 | Discharge: 2022-07-27 | Disposition: A | Discharge status: Home / Self Care | Payer: PPO | Source: Ambulatory Visit | Attending: Nephrology | Admitting: Nephrology

## 2022-07-27 VITALS — BP 160/68 | HR 47 | Temp 96.4°F

## 2022-07-27 DIAGNOSIS — N179 Acute kidney failure, unspecified: Secondary | ICD-10-CM | POA: Insufficient documentation

## 2022-07-27 DIAGNOSIS — N185 Chronic kidney disease, stage 5: Secondary | ICD-10-CM | POA: Diagnosis not present

## 2022-07-27 DIAGNOSIS — R7989 Other specified abnormal findings of blood chemistry: Secondary | ICD-10-CM | POA: Diagnosis not present

## 2022-07-27 LAB — IRON AND TIBC
Iron: 90 ug/dL (ref 45–182)
Saturation Ratios: 30 % (ref 17.9–39.5)
TIBC: 300 ug/dL (ref 250–450)
UIBC: 210 ug/dL

## 2022-07-27 LAB — FERRITIN: Ferritin: 367 ng/mL — ABNORMAL HIGH (ref 24–336)

## 2022-07-27 MED ORDER — EPOETIN ALFA-EPBX 40000 UNIT/ML IJ SOLN
INTRAMUSCULAR | Status: AC
  Filled 2022-07-27: qty 1

## 2022-07-27 MED ORDER — EPOETIN ALFA-EPBX 40000 UNIT/ML IJ SOLN
25000.0000 [IU] | INTRAMUSCULAR | Status: DC
  Administered 2022-07-27: 25000 [IU] via SUBCUTANEOUS

## 2022-07-28 LAB — POCT HEMOGLOBIN-HEMACUE: Hemoglobin: 11.7 g/dL — ABNORMAL LOW (ref 13.0–17.0)

## 2022-07-29 LAB — POCT HEMOGLOBIN-HEMACUE: Hemoglobin: 9.9 g/dL — ABNORMAL LOW (ref 13.0–17.0)

## 2022-08-10 ENCOUNTER — Encounter (HOSPITAL_COMMUNITY)
Admission: RE | Admit: 2022-08-10 | Discharge: 2022-08-10 | Disposition: A | Discharge status: Home / Self Care | Payer: PPO | Source: Ambulatory Visit | Attending: Nephrology | Admitting: Nephrology

## 2022-08-10 VITALS — BP 180/76 | HR 56 | Temp 97.4°F | Resp 17

## 2022-08-10 DIAGNOSIS — N179 Acute kidney failure, unspecified: Secondary | ICD-10-CM | POA: Diagnosis not present

## 2022-08-10 LAB — RENAL FUNCTION PANEL
Albumin: 3.7 g/dL (ref 3.5–5.0)
Anion gap: 8 (ref 5–15)
BUN: 36 mg/dL — ABNORMAL HIGH (ref 8–23)
CO2: 22 mmol/L (ref 22–32)
Calcium: 9.8 mg/dL (ref 8.9–10.3)
Chloride: 110 mmol/L (ref 98–111)
Creatinine, Ser: 4.4 mg/dL — ABNORMAL HIGH (ref 0.61–1.24)
GFR, Estimated: 14 mL/min — ABNORMAL LOW (ref >60)
Glucose, Bld: 134 mg/dL — ABNORMAL HIGH (ref 70–99)
Phosphorus: 3.3 mg/dL (ref 2.5–4.6)
Potassium: 4.2 mmol/L (ref 3.5–5.1)
Sodium: 140 mmol/L (ref 135–145)

## 2022-08-10 LAB — POCT HEMOGLOBIN-HEMACUE: Hemoglobin: 11.9 g/dL — ABNORMAL LOW (ref 13.0–17.0)

## 2022-08-10 LAB — VITAMIN D 25 HYDROXY (VIT D DEFICIENCY, FRACTURES): Vit D, 25-Hydroxy: 48.73 ng/mL (ref 30–100)

## 2022-08-10 MED ORDER — EPOETIN ALFA-EPBX 40000 UNIT/ML IJ SOLN
INTRAMUSCULAR | Status: AC
  Filled 2022-08-10: qty 1

## 2022-08-10 MED ORDER — EPOETIN ALFA-EPBX 40000 UNIT/ML IJ SOLN
25000.0000 [IU] | INTRAMUSCULAR | Status: DC
  Administered 2022-08-10: 25000 [IU] via SUBCUTANEOUS

## 2022-08-11 LAB — PTH, INTACT AND CALCIUM
Calcium, Total (PTH): 9.8 mg/dL (ref 8.6–10.2)
PTH: 37 pg/mL (ref 15–65)

## 2022-08-17 DIAGNOSIS — E872 Acidosis, unspecified: Secondary | ICD-10-CM | POA: Diagnosis not present

## 2022-08-17 DIAGNOSIS — N2581 Secondary hyperparathyroidism of renal origin: Secondary | ICD-10-CM | POA: Diagnosis not present

## 2022-08-17 DIAGNOSIS — I120 Hypertensive chronic kidney disease with stage 5 chronic kidney disease or end stage renal disease: Secondary | ICD-10-CM | POA: Diagnosis not present

## 2022-08-17 DIAGNOSIS — D631 Anemia in chronic kidney disease: Secondary | ICD-10-CM | POA: Diagnosis not present

## 2022-08-17 DIAGNOSIS — N058 Unspecified nephritic syndrome with other morphologic changes: Secondary | ICD-10-CM | POA: Diagnosis not present

## 2022-08-17 DIAGNOSIS — N057 Unspecified nephritic syndrome with diffuse crescentic glomerulonephritis: Secondary | ICD-10-CM | POA: Diagnosis not present

## 2022-08-17 DIAGNOSIS — N185 Chronic kidney disease, stage 5: Secondary | ICD-10-CM | POA: Diagnosis not present

## 2022-08-17 DIAGNOSIS — E875 Hyperkalemia: Secondary | ICD-10-CM | POA: Diagnosis not present

## 2022-08-17 DIAGNOSIS — I770 Arteriovenous fistula, acquired: Secondary | ICD-10-CM | POA: Diagnosis not present

## 2022-08-24 ENCOUNTER — Ambulatory Visit (HOSPITAL_COMMUNITY)
Admission: RE | Admit: 2022-08-24 | Discharge: 2022-08-24 | Disposition: A | Discharge status: Home / Self Care | Payer: PPO | Source: Ambulatory Visit | Attending: Nephrology | Admitting: Nephrology

## 2022-08-24 ENCOUNTER — Encounter (HOSPITAL_COMMUNITY): Payer: PPO

## 2022-08-24 VITALS — BP 155/72 | HR 56 | Temp 95.7°F | Resp 17

## 2022-08-24 DIAGNOSIS — R7989 Other specified abnormal findings of blood chemistry: Secondary | ICD-10-CM | POA: Insufficient documentation

## 2022-08-24 DIAGNOSIS — N179 Acute kidney failure, unspecified: Secondary | ICD-10-CM | POA: Insufficient documentation

## 2022-08-24 LAB — POCT HEMOGLOBIN-HEMACUE: Hemoglobin: 11.6 g/dL — ABNORMAL LOW (ref 13.0–17.0)

## 2022-08-24 LAB — IRON AND TIBC
Iron: 90 ug/dL (ref 45–182)
Saturation Ratios: 31 % (ref 17.9–39.5)
TIBC: 290 ug/dL (ref 250–450)
UIBC: 200 ug/dL

## 2022-08-24 LAB — FERRITIN: Ferritin: 282 ng/mL (ref 24–336)

## 2022-08-24 MED ORDER — EPOETIN ALFA-EPBX 40000 UNIT/ML IJ SOLN
30000.0000 [IU] | INTRAMUSCULAR | Status: DC
  Administered 2022-08-24: 30000 [IU] via SUBCUTANEOUS

## 2022-08-24 MED ORDER — EPOETIN ALFA-EPBX 40000 UNIT/ML IJ SOLN
INTRAMUSCULAR | Status: AC
  Filled 2022-08-24: qty 1

## 2022-08-31 DIAGNOSIS — M545 Low back pain, unspecified: Secondary | ICD-10-CM | POA: Diagnosis not present

## 2022-09-01 DIAGNOSIS — N185 Chronic kidney disease, stage 5: Secondary | ICD-10-CM | POA: Diagnosis not present

## 2022-09-01 DIAGNOSIS — N057 Unspecified nephritic syndrome with diffuse crescentic glomerulonephritis: Secondary | ICD-10-CM | POA: Diagnosis not present

## 2022-09-01 DIAGNOSIS — E119 Type 2 diabetes mellitus without complications: Secondary | ICD-10-CM | POA: Diagnosis not present

## 2022-09-01 DIAGNOSIS — E0822 Diabetes mellitus due to underlying condition with diabetic chronic kidney disease: Secondary | ICD-10-CM | POA: Diagnosis not present

## 2022-09-13 DIAGNOSIS — M47816 Spondylosis without myelopathy or radiculopathy, lumbar region: Secondary | ICD-10-CM | POA: Diagnosis not present

## 2022-09-13 DIAGNOSIS — M4696 Unspecified inflammatory spondylopathy, lumbar region: Secondary | ICD-10-CM | POA: Diagnosis not present

## 2022-09-13 DIAGNOSIS — M5451 Vertebrogenic low back pain: Secondary | ICD-10-CM | POA: Diagnosis not present

## 2022-09-21 ENCOUNTER — Ambulatory Visit (HOSPITAL_COMMUNITY)
Admission: RE | Admit: 2022-09-21 | Discharge: 2022-09-21 | Disposition: A | Discharge status: Home / Self Care | Payer: PPO | Source: Ambulatory Visit | Attending: Nephrology | Admitting: Nephrology

## 2022-09-21 VITALS — BP 150/79 | HR 51 | Temp 97.2°F | Resp 17

## 2022-09-21 DIAGNOSIS — D631 Anemia in chronic kidney disease: Secondary | ICD-10-CM | POA: Diagnosis not present

## 2022-09-21 DIAGNOSIS — N185 Chronic kidney disease, stage 5: Secondary | ICD-10-CM | POA: Diagnosis not present

## 2022-09-21 DIAGNOSIS — N179 Acute kidney failure, unspecified: Secondary | ICD-10-CM | POA: Insufficient documentation

## 2022-09-21 LAB — IRON AND TIBC
Iron: 108 ug/dL (ref 45–182)
Saturation Ratios: 34 % (ref 17.9–39.5)
TIBC: 321 ug/dL (ref 250–450)
UIBC: 213 ug/dL

## 2022-09-21 LAB — POCT HEMOGLOBIN-HEMACUE: Hemoglobin: 11.3 g/dL — ABNORMAL LOW (ref 13.0–17.0)

## 2022-09-21 LAB — FERRITIN: Ferritin: 340 ng/mL — ABNORMAL HIGH (ref 24–336)

## 2022-09-21 MED ORDER — EPOETIN ALFA-EPBX 40000 UNIT/ML IJ SOLN: 30000.0000 [IU] | INTRAMUSCULAR | Status: DC

## 2022-09-21 MED ORDER — EPOETIN ALFA-EPBX 40000 UNIT/ML IJ SOLN
INTRAMUSCULAR | Status: AC
  Administered 2022-09-21: 30000 [IU] via SUBCUTANEOUS
  Filled 2022-09-21: qty 1

## 2022-09-26 DIAGNOSIS — M4696 Unspecified inflammatory spondylopathy, lumbar region: Secondary | ICD-10-CM | POA: Diagnosis not present

## 2022-09-26 DIAGNOSIS — M5451 Vertebrogenic low back pain: Secondary | ICD-10-CM | POA: Diagnosis not present

## 2022-09-26 DIAGNOSIS — M47816 Spondylosis without myelopathy or radiculopathy, lumbar region: Secondary | ICD-10-CM | POA: Diagnosis not present

## 2022-09-29 ENCOUNTER — Encounter: Payer: Self-pay | Admitting: Hematology and Oncology

## 2022-09-29 ENCOUNTER — Encounter (HOSPITAL_COMMUNITY): Payer: Self-pay

## 2022-09-29 DIAGNOSIS — M5451 Vertebrogenic low back pain: Secondary | ICD-10-CM | POA: Diagnosis not present

## 2022-09-29 DIAGNOSIS — M4696 Unspecified inflammatory spondylopathy, lumbar region: Secondary | ICD-10-CM | POA: Diagnosis not present

## 2022-09-29 DIAGNOSIS — M47816 Spondylosis without myelopathy or radiculopathy, lumbar region: Secondary | ICD-10-CM | POA: Diagnosis not present

## 2022-09-29 NOTE — Telephone Encounter (Signed)
Signing encounter, see previous note 11/26/20

## 2022-10-03 DIAGNOSIS — M4696 Unspecified inflammatory spondylopathy, lumbar region: Secondary | ICD-10-CM | POA: Diagnosis not present

## 2022-10-03 DIAGNOSIS — M47816 Spondylosis without myelopathy or radiculopathy, lumbar region: Secondary | ICD-10-CM | POA: Diagnosis not present

## 2022-10-03 DIAGNOSIS — M5451 Vertebrogenic low back pain: Secondary | ICD-10-CM | POA: Diagnosis not present

## 2022-10-10 DIAGNOSIS — N185 Chronic kidney disease, stage 5: Secondary | ICD-10-CM | POA: Diagnosis not present

## 2022-10-10 DIAGNOSIS — N400 Enlarged prostate without lower urinary tract symptoms: Secondary | ICD-10-CM | POA: Diagnosis not present

## 2022-10-10 DIAGNOSIS — N281 Cyst of kidney, acquired: Secondary | ICD-10-CM | POA: Diagnosis not present

## 2022-10-17 DIAGNOSIS — N2581 Secondary hyperparathyroidism of renal origin: Secondary | ICD-10-CM | POA: Diagnosis not present

## 2022-10-17 DIAGNOSIS — N058 Unspecified nephritic syndrome with other morphologic changes: Secondary | ICD-10-CM | POA: Diagnosis not present

## 2022-10-17 DIAGNOSIS — N057 Unspecified nephritic syndrome with diffuse crescentic glomerulonephritis: Secondary | ICD-10-CM | POA: Diagnosis not present

## 2022-10-17 DIAGNOSIS — R1310 Dysphagia, unspecified: Secondary | ICD-10-CM | POA: Diagnosis not present

## 2022-10-17 DIAGNOSIS — K219 Gastro-esophageal reflux disease without esophagitis: Secondary | ICD-10-CM | POA: Diagnosis not present

## 2022-10-17 DIAGNOSIS — N185 Chronic kidney disease, stage 5: Secondary | ICD-10-CM | POA: Diagnosis not present

## 2022-10-17 DIAGNOSIS — I770 Arteriovenous fistula, acquired: Secondary | ICD-10-CM | POA: Diagnosis not present

## 2022-10-17 DIAGNOSIS — Z86010 Personal history of colonic polyps: Secondary | ICD-10-CM | POA: Diagnosis not present

## 2022-10-17 DIAGNOSIS — E875 Hyperkalemia: Secondary | ICD-10-CM | POA: Diagnosis not present

## 2022-10-17 DIAGNOSIS — I120 Hypertensive chronic kidney disease with stage 5 chronic kidney disease or end stage renal disease: Secondary | ICD-10-CM | POA: Diagnosis not present

## 2022-10-17 DIAGNOSIS — D631 Anemia in chronic kidney disease: Secondary | ICD-10-CM | POA: Diagnosis not present

## 2022-10-18 DIAGNOSIS — M5451 Vertebrogenic low back pain: Secondary | ICD-10-CM | POA: Diagnosis not present

## 2022-10-18 DIAGNOSIS — M4696 Unspecified inflammatory spondylopathy, lumbar region: Secondary | ICD-10-CM | POA: Diagnosis not present

## 2022-10-18 DIAGNOSIS — M47816 Spondylosis without myelopathy or radiculopathy, lumbar region: Secondary | ICD-10-CM | POA: Diagnosis not present

## 2022-10-19 ENCOUNTER — Ambulatory Visit (HOSPITAL_COMMUNITY)
Admission: RE | Admit: 2022-10-19 | Discharge: 2022-10-19 | Disposition: A | Discharge status: Home / Self Care | Payer: PPO | Source: Ambulatory Visit | Attending: Nephrology | Admitting: Nephrology

## 2022-10-19 VITALS — BP 159/92 | HR 54 | Temp 97.9°F | Resp 17

## 2022-10-19 DIAGNOSIS — N185 Chronic kidney disease, stage 5: Secondary | ICD-10-CM | POA: Insufficient documentation

## 2022-10-19 DIAGNOSIS — N179 Acute kidney failure, unspecified: Secondary | ICD-10-CM | POA: Insufficient documentation

## 2022-10-19 DIAGNOSIS — D631 Anemia in chronic kidney disease: Secondary | ICD-10-CM | POA: Insufficient documentation

## 2022-10-19 LAB — IRON AND TIBC
Iron: 85 ug/dL (ref 45–182)
Saturation Ratios: 26 % (ref 17.9–39.5)
TIBC: 326 ug/dL (ref 250–450)
UIBC: 241 ug/dL

## 2022-10-19 LAB — FERRITIN: Ferritin: 435 ng/mL — ABNORMAL HIGH (ref 24–336)

## 2022-10-19 LAB — POCT HEMOGLOBIN-HEMACUE: Hemoglobin: 10.7 g/dL — ABNORMAL LOW (ref 13.0–17.0)

## 2022-10-19 MED ORDER — EPOETIN ALFA-EPBX 40000 UNIT/ML IJ SOLN
30000.0000 [IU] | INTRAMUSCULAR | Status: DC
  Administered 2022-10-19: 30000 [IU] via SUBCUTANEOUS

## 2022-10-19 MED ORDER — EPOETIN ALFA-EPBX 40000 UNIT/ML IJ SOLN
INTRAMUSCULAR | Status: AC
  Filled 2022-10-19: qty 1

## 2022-10-27 DIAGNOSIS — M2352 Chronic instability of knee, left knee: Secondary | ICD-10-CM | POA: Diagnosis not present

## 2022-11-16 ENCOUNTER — Encounter (HOSPITAL_COMMUNITY)
Admission: RE | Admit: 2022-11-16 | Discharge: 2022-11-16 | Disposition: A | Discharge status: Home / Self Care | Payer: PPO | Source: Ambulatory Visit | Attending: Nephrology | Admitting: Nephrology

## 2022-11-16 VITALS — BP 157/75 | HR 55 | Temp 97.4°F | Resp 17

## 2022-11-16 DIAGNOSIS — N179 Acute kidney failure, unspecified: Secondary | ICD-10-CM | POA: Diagnosis not present

## 2022-11-16 DIAGNOSIS — N185 Chronic kidney disease, stage 5: Secondary | ICD-10-CM | POA: Insufficient documentation

## 2022-11-16 DIAGNOSIS — D631 Anemia in chronic kidney disease: Secondary | ICD-10-CM | POA: Diagnosis not present

## 2022-11-16 LAB — IRON AND TIBC
Iron: 87 ug/dL (ref 45–182)
Saturation Ratios: 29 % (ref 17.9–39.5)
TIBC: 301 ug/dL (ref 250–450)
UIBC: 214 ug/dL

## 2022-11-16 LAB — POCT HEMOGLOBIN-HEMACUE: Hemoglobin: 11.2 g/dL — ABNORMAL LOW (ref 13.0–17.0)

## 2022-11-16 LAB — FERRITIN: Ferritin: 509 ng/mL — ABNORMAL HIGH (ref 24–336)

## 2022-11-16 MED ORDER — EPOETIN ALFA-EPBX 40000 UNIT/ML IJ SOLN: 30000.0000 [IU] | INTRAMUSCULAR | Status: DC

## 2022-11-16 MED ORDER — EPOETIN ALFA-EPBX 40000 UNIT/ML IJ SOLN
INTRAMUSCULAR | Status: AC
  Administered 2022-11-16: 30000 [IU] via SUBCUTANEOUS
  Filled 2022-11-16: qty 1

## 2022-12-01 DIAGNOSIS — Z09 Encounter for follow-up examination after completed treatment for conditions other than malignant neoplasm: Secondary | ICD-10-CM | POA: Diagnosis not present

## 2022-12-01 DIAGNOSIS — Z86010 Personal history of colonic polyps: Secondary | ICD-10-CM | POA: Diagnosis not present

## 2022-12-01 DIAGNOSIS — K5730 Diverticulosis of large intestine without perforation or abscess without bleeding: Secondary | ICD-10-CM | POA: Diagnosis not present

## 2022-12-01 LAB — HM COLONOSCOPY

## 2022-12-13 ENCOUNTER — Other Ambulatory Visit: Payer: Self-pay | Admitting: Gastroenterology

## 2022-12-13 DIAGNOSIS — Z86010 Personal history of colonic polyps: Secondary | ICD-10-CM

## 2022-12-14 ENCOUNTER — Encounter (HOSPITAL_COMMUNITY)
Admission: RE | Admit: 2022-12-14 | Discharge: 2022-12-14 | Disposition: A | Discharge status: Home / Self Care | Payer: PPO | Source: Ambulatory Visit | Attending: Nephrology | Admitting: Nephrology

## 2022-12-14 VITALS — BP 142/78 | HR 50 | Temp 97.7°F | Resp 17

## 2022-12-14 DIAGNOSIS — N179 Acute kidney failure, unspecified: Secondary | ICD-10-CM | POA: Diagnosis not present

## 2022-12-14 LAB — POCT HEMOGLOBIN-HEMACUE: Hemoglobin: 10.8 g/dL — ABNORMAL LOW (ref 13.0–17.0)

## 2022-12-14 MED ORDER — EPOETIN ALFA-EPBX 40000 UNIT/ML IJ SOLN
INTRAMUSCULAR | Status: AC
  Filled 2022-12-14: qty 1

## 2022-12-14 MED ORDER — EPOETIN ALFA-EPBX 40000 UNIT/ML IJ SOLN
30000.0000 [IU] | INTRAMUSCULAR | Status: DC
  Administered 2022-12-14: 30000 [IU] via SUBCUTANEOUS

## 2022-12-26 DIAGNOSIS — N185 Chronic kidney disease, stage 5: Secondary | ICD-10-CM | POA: Diagnosis not present

## 2023-01-03 DIAGNOSIS — I120 Hypertensive chronic kidney disease with stage 5 chronic kidney disease or end stage renal disease: Secondary | ICD-10-CM | POA: Diagnosis not present

## 2023-01-03 DIAGNOSIS — N185 Chronic kidney disease, stage 5: Secondary | ICD-10-CM | POA: Diagnosis not present

## 2023-01-03 DIAGNOSIS — N057 Unspecified nephritic syndrome with diffuse crescentic glomerulonephritis: Secondary | ICD-10-CM | POA: Diagnosis not present

## 2023-01-03 DIAGNOSIS — D631 Anemia in chronic kidney disease: Secondary | ICD-10-CM | POA: Diagnosis not present

## 2023-01-03 DIAGNOSIS — N2581 Secondary hyperparathyroidism of renal origin: Secondary | ICD-10-CM | POA: Diagnosis not present

## 2023-01-03 DIAGNOSIS — E872 Acidosis, unspecified: Secondary | ICD-10-CM | POA: Diagnosis not present

## 2023-01-03 DIAGNOSIS — E875 Hyperkalemia: Secondary | ICD-10-CM | POA: Diagnosis not present

## 2023-01-03 DIAGNOSIS — N058 Unspecified nephritic syndrome with other morphologic changes: Secondary | ICD-10-CM | POA: Diagnosis not present

## 2023-01-03 DIAGNOSIS — I770 Arteriovenous fistula, acquired: Secondary | ICD-10-CM | POA: Diagnosis not present

## 2023-01-11 ENCOUNTER — Ambulatory Visit (HOSPITAL_COMMUNITY)
Admission: RE | Admit: 2023-01-11 | Discharge: 2023-01-11 | Disposition: A | Discharge status: Home / Self Care | Payer: PPO | Source: Ambulatory Visit | Attending: Nephrology | Admitting: Nephrology

## 2023-01-11 VITALS — BP 155/70 | HR 53 | Temp 97.1°F | Resp 17

## 2023-01-11 DIAGNOSIS — N185 Chronic kidney disease, stage 5: Secondary | ICD-10-CM | POA: Insufficient documentation

## 2023-01-11 DIAGNOSIS — D631 Anemia in chronic kidney disease: Secondary | ICD-10-CM | POA: Diagnosis not present

## 2023-01-11 DIAGNOSIS — N179 Acute kidney failure, unspecified: Secondary | ICD-10-CM

## 2023-01-11 LAB — IRON AND TIBC
Iron: 94 ug/dL (ref 45–182)
Saturation Ratios: 28 % (ref 17.9–39.5)
TIBC: 332 ug/dL (ref 250–450)
UIBC: 238 ug/dL

## 2023-01-11 LAB — POCT HEMOGLOBIN-HEMACUE: Hemoglobin: 10.6 g/dL — ABNORMAL LOW (ref 13.0–17.0)

## 2023-01-11 LAB — FERRITIN: Ferritin: 401 ng/mL — ABNORMAL HIGH (ref 24–336)

## 2023-01-11 MED ORDER — EPOETIN ALFA-EPBX 40000 UNIT/ML IJ SOLN
INTRAMUSCULAR | Status: AC
  Filled 2023-01-11: qty 1

## 2023-01-11 MED ORDER — EPOETIN ALFA-EPBX 40000 UNIT/ML IJ SOLN
30000.0000 [IU] | INTRAMUSCULAR | Status: DC
  Administered 2023-01-11: 30000 [IU] via SUBCUTANEOUS

## 2023-01-31 ENCOUNTER — Ambulatory Visit
Admission: RE | Admit: 2023-01-31 | Discharge: 2023-01-31 | Disposition: A | Discharge status: Home / Self Care | Payer: PPO | Source: Ambulatory Visit | Attending: Gastroenterology | Admitting: Gastroenterology

## 2023-01-31 DIAGNOSIS — K5730 Diverticulosis of large intestine without perforation or abscess without bleeding: Secondary | ICD-10-CM | POA: Diagnosis not present

## 2023-01-31 DIAGNOSIS — I714 Abdominal aortic aneurysm, without rupture, unspecified: Secondary | ICD-10-CM | POA: Diagnosis not present

## 2023-01-31 DIAGNOSIS — Z86010 Personal history of colonic polyps: Secondary | ICD-10-CM | POA: Diagnosis not present

## 2023-01-31 DIAGNOSIS — M47816 Spondylosis without myelopathy or radiculopathy, lumbar region: Secondary | ICD-10-CM | POA: Diagnosis not present

## 2023-02-08 ENCOUNTER — Ambulatory Visit (HOSPITAL_COMMUNITY)
Admission: RE | Admit: 2023-02-08 | Discharge: 2023-02-08 | Disposition: A | Discharge status: Home / Self Care | Payer: PPO | Source: Ambulatory Visit | Attending: Nephrology | Admitting: Nephrology

## 2023-02-08 VITALS — BP 160/74 | HR 55 | Temp 98.7°F | Resp 17

## 2023-02-08 DIAGNOSIS — D631 Anemia in chronic kidney disease: Secondary | ICD-10-CM | POA: Diagnosis not present

## 2023-02-08 DIAGNOSIS — N179 Acute kidney failure, unspecified: Secondary | ICD-10-CM | POA: Diagnosis not present

## 2023-02-08 DIAGNOSIS — N185 Chronic kidney disease, stage 5: Secondary | ICD-10-CM | POA: Diagnosis not present

## 2023-02-08 LAB — POCT HEMOGLOBIN-HEMACUE: Hemoglobin: 11.3 g/dL — ABNORMAL LOW (ref 13.0–17.0)

## 2023-02-08 LAB — IRON AND TIBC
Iron: 105 ug/dL (ref 45–182)
Saturation Ratios: 31 % (ref 17.9–39.5)
TIBC: 335 ug/dL (ref 250–450)
UIBC: 230 ug/dL

## 2023-02-08 LAB — FERRITIN: Ferritin: 381 ng/mL — ABNORMAL HIGH (ref 24–336)

## 2023-02-08 MED ORDER — EPOETIN ALFA-EPBX 40000 UNIT/ML IJ SOLN
30000.0000 [IU] | INTRAMUSCULAR | Status: DC
  Administered 2023-02-08: 30000 [IU] via SUBCUTANEOUS

## 2023-02-08 MED ORDER — EPOETIN ALFA-EPBX 40000 UNIT/ML IJ SOLN
INTRAMUSCULAR | Status: AC
  Filled 2023-02-08: qty 1

## 2023-03-08 ENCOUNTER — Ambulatory Visit (HOSPITAL_COMMUNITY)
Admission: RE | Admit: 2023-03-08 | Discharge: 2023-03-08 | Disposition: A | Discharge status: Home / Self Care | Payer: PPO | Source: Ambulatory Visit | Attending: Nephrology | Admitting: Nephrology

## 2023-03-08 VITALS — BP 169/71 | HR 55 | Temp 97.2°F | Resp 18

## 2023-03-08 DIAGNOSIS — N179 Acute kidney failure, unspecified: Secondary | ICD-10-CM | POA: Insufficient documentation

## 2023-03-08 DIAGNOSIS — N185 Chronic kidney disease, stage 5: Secondary | ICD-10-CM | POA: Diagnosis not present

## 2023-03-08 DIAGNOSIS — D631 Anemia in chronic kidney disease: Secondary | ICD-10-CM | POA: Diagnosis not present

## 2023-03-08 LAB — FERRITIN: Ferritin: 360 ng/mL — ABNORMAL HIGH (ref 24–336)

## 2023-03-08 LAB — IRON AND TIBC
Iron: 78 ug/dL (ref 45–182)
Saturation Ratios: 24 % (ref 17.9–39.5)
TIBC: 319 ug/dL (ref 250–450)
UIBC: 241 ug/dL

## 2023-03-08 LAB — POCT HEMOGLOBIN-HEMACUE: Hemoglobin: 10.3 g/dL — ABNORMAL LOW (ref 13.0–17.0)

## 2023-03-08 MED ORDER — EPOETIN ALFA-EPBX 40000 UNIT/ML IJ SOLN
INTRAMUSCULAR | Status: AC
  Filled 2023-03-08: qty 1

## 2023-03-08 MED ORDER — EPOETIN ALFA-EPBX 40000 UNIT/ML IJ SOLN
30000.0000 [IU] | INTRAMUSCULAR | Status: DC
  Administered 2023-03-08: 30000 [IU] via SUBCUTANEOUS

## 2023-03-23 DIAGNOSIS — N185 Chronic kidney disease, stage 5: Secondary | ICD-10-CM | POA: Diagnosis not present

## 2023-03-23 DIAGNOSIS — N189 Chronic kidney disease, unspecified: Secondary | ICD-10-CM | POA: Diagnosis not present

## 2023-03-23 DIAGNOSIS — I7141 Pararenal abdominal aortic aneurysm, without rupture: Secondary | ICD-10-CM | POA: Diagnosis not present

## 2023-03-23 DIAGNOSIS — E119 Type 2 diabetes mellitus without complications: Secondary | ICD-10-CM | POA: Diagnosis not present

## 2023-03-23 DIAGNOSIS — I10 Essential (primary) hypertension: Secondary | ICD-10-CM | POA: Diagnosis not present

## 2023-03-23 DIAGNOSIS — Z0000 Encounter for general adult medical examination without abnormal findings: Secondary | ICD-10-CM | POA: Diagnosis not present

## 2023-03-23 DIAGNOSIS — J439 Emphysema, unspecified: Secondary | ICD-10-CM | POA: Diagnosis not present

## 2023-03-23 DIAGNOSIS — N058 Unspecified nephritic syndrome with other morphologic changes: Secondary | ICD-10-CM | POA: Diagnosis not present

## 2023-03-23 DIAGNOSIS — Z8639 Personal history of other endocrine, nutritional and metabolic disease: Secondary | ICD-10-CM | POA: Diagnosis not present

## 2023-03-23 DIAGNOSIS — N2581 Secondary hyperparathyroidism of renal origin: Secondary | ICD-10-CM | POA: Diagnosis not present

## 2023-03-27 DIAGNOSIS — I120 Hypertensive chronic kidney disease with stage 5 chronic kidney disease or end stage renal disease: Secondary | ICD-10-CM | POA: Diagnosis not present

## 2023-03-27 DIAGNOSIS — E872 Acidosis, unspecified: Secondary | ICD-10-CM | POA: Diagnosis not present

## 2023-03-27 DIAGNOSIS — N2581 Secondary hyperparathyroidism of renal origin: Secondary | ICD-10-CM | POA: Diagnosis not present

## 2023-03-27 DIAGNOSIS — I770 Arteriovenous fistula, acquired: Secondary | ICD-10-CM | POA: Diagnosis not present

## 2023-03-27 DIAGNOSIS — D631 Anemia in chronic kidney disease: Secondary | ICD-10-CM | POA: Diagnosis not present

## 2023-03-27 DIAGNOSIS — N185 Chronic kidney disease, stage 5: Secondary | ICD-10-CM | POA: Diagnosis not present

## 2023-03-27 DIAGNOSIS — N057 Unspecified nephritic syndrome with diffuse crescentic glomerulonephritis: Secondary | ICD-10-CM | POA: Diagnosis not present

## 2023-03-27 DIAGNOSIS — N058 Unspecified nephritic syndrome with other morphologic changes: Secondary | ICD-10-CM | POA: Diagnosis not present

## 2023-03-30 ENCOUNTER — Other Ambulatory Visit: Payer: Self-pay | Admitting: *Deleted

## 2023-03-30 DIAGNOSIS — I7143 Infrarenal abdominal aortic aneurysm, without rupture: Secondary | ICD-10-CM

## 2023-04-04 ENCOUNTER — Other Ambulatory Visit (HOSPITAL_COMMUNITY): Payer: Self-pay | Admitting: *Deleted

## 2023-04-05 ENCOUNTER — Ambulatory Visit (HOSPITAL_COMMUNITY)
Admission: RE | Admit: 2023-04-05 | Discharge: 2023-04-05 | Disposition: A | Discharge status: Home / Self Care | Payer: PPO | Source: Ambulatory Visit | Attending: Nephrology | Admitting: Nephrology

## 2023-04-05 VITALS — BP 146/68 | HR 49 | Temp 97.7°F | Resp 17

## 2023-04-05 DIAGNOSIS — N189 Chronic kidney disease, unspecified: Secondary | ICD-10-CM | POA: Insufficient documentation

## 2023-04-05 DIAGNOSIS — D631 Anemia in chronic kidney disease: Secondary | ICD-10-CM | POA: Insufficient documentation

## 2023-04-05 DIAGNOSIS — N179 Acute kidney failure, unspecified: Secondary | ICD-10-CM | POA: Diagnosis not present

## 2023-04-05 LAB — FERRITIN: Ferritin: 330 ng/mL (ref 24–336)

## 2023-04-05 LAB — IRON AND TIBC
Iron: 92 ug/dL (ref 45–182)
Saturation Ratios: 27 % (ref 17.9–39.5)
TIBC: 337 ug/dL (ref 250–450)
UIBC: 245 ug/dL

## 2023-04-05 MED ORDER — EPOETIN ALFA-EPBX 40000 UNIT/ML IJ SOLN: 30000.0000 [IU] | INTRAMUSCULAR | Status: DC

## 2023-04-05 MED ORDER — EPOETIN ALFA-EPBX 40000 UNIT/ML IJ SOLN
INTRAMUSCULAR | Status: AC
  Administered 2023-04-05: 30000 [IU] via SUBCUTANEOUS
  Filled 2023-04-05: qty 1

## 2023-04-06 LAB — POCT HEMOGLOBIN-HEMACUE: Hemoglobin: 10.3 g/dL — ABNORMAL LOW (ref 13.0–17.0)

## 2023-04-07 ENCOUNTER — Encounter (HOSPITAL_COMMUNITY)
Admission: RE | Admit: 2023-04-07 | Discharge: 2023-04-07 | Disposition: A | Discharge status: Home / Self Care | Payer: PPO | Source: Ambulatory Visit | Attending: Nephrology | Admitting: Nephrology

## 2023-04-07 DIAGNOSIS — D631 Anemia in chronic kidney disease: Secondary | ICD-10-CM | POA: Insufficient documentation

## 2023-04-07 DIAGNOSIS — N185 Chronic kidney disease, stage 5: Secondary | ICD-10-CM | POA: Diagnosis not present

## 2023-04-07 MED ORDER — SODIUM CHLORIDE 0.9 % IV SOLN
510.0000 mg | INTRAVENOUS | Status: DC
  Administered 2023-04-07: 510 mg via INTRAVENOUS
  Filled 2023-04-07: qty 17

## 2023-04-11 ENCOUNTER — Ambulatory Visit (HOSPITAL_COMMUNITY)
Admission: RE | Admit: 2023-04-11 | Discharge: 2023-04-11 | Disposition: A | Discharge status: Home / Self Care | Payer: PPO | Source: Ambulatory Visit | Attending: Vascular Surgery | Admitting: Vascular Surgery

## 2023-04-11 ENCOUNTER — Ambulatory Visit (INDEPENDENT_AMBULATORY_CARE_PROVIDER_SITE_OTHER): Payer: PPO | Admitting: Vascular Surgery

## 2023-04-11 ENCOUNTER — Encounter: Payer: Self-pay | Admitting: Vascular Surgery

## 2023-04-11 VITALS — BP 144/76 | HR 54 | Temp 97.7°F | Resp 20 | Ht 69.0 in | Wt 182.9 lb

## 2023-04-11 DIAGNOSIS — I7143 Infrarenal abdominal aortic aneurysm, without rupture: Secondary | ICD-10-CM | POA: Diagnosis not present

## 2023-04-11 DIAGNOSIS — N184 Chronic kidney disease, stage 4 (severe): Secondary | ICD-10-CM

## 2023-04-11 NOTE — Progress Notes (Signed)
[facility] OF [place]  ASSESSMENT / PLAN: Taylor King is a 72 y.o. male with a infrarenal abdominal aortic aneurysm measuring 38mm by Duplex.  Counseled the patient that his rupture risk is very low.  I counseled him about the rationale for continued surveillance.  I counseled him about the threshold for intervention at 55mm.  Recommend the following to reduce the risk of major adverse cardiac / limb events.  Complete cessation from all tobacco products. Excellent blood glucose control with goal A1c < 7%. Blood pressure control with goal blood pressure < 140/90 mmHg. Excellent lipid reduction therapy with goal LDL-C <100 mg/dL. Aspirin 81mg  PO QD.  Atorvastatin 40-80mg  PO QD (or other "high intensity" statin therapy).  Follow-up with me in 2-3 year with repeat aortic duplex.  Will check renal artery duplex to see if any intervention can be performed to reduce his risk of dialysis dependence.  CHIEF COMPLAINT: Abdominal aortic aneurysm  HISTORY OF PRESENT ILLNESS: Taylor King is a 72 y.o. male referred to clinic for enlarging abdominal aortic aneurysm.  He has been under the care of Dr. Nehemiah Settle, who has been monitoring this.  The aneurysm recently grew to 40 mm.  The patient has many excellent questions about surveillance.  His wife, and her family have a strong personal history of aneurysms, she has multiple excellent questions as well.  I reviewed these all to the best of my ability.  The patient previously had dialysis access created for him by my partner, Dr. Darrick Penna, thankfully he has not needed to start dialysis yet.   04/11/23: Patient returns for surveillance of his abdominal aortic aneurysm.  Thankfully this is not changed.  His renal function is still marginal, but he has not yet advanced to end-stage renal disease.  His wife is very concerned about his aneurysm.  The patient is not concerned about his aneurysm.  His wife is also concerned that he may have  renal artery stenosis.  While several renal duplexes have been performed, these did not evaluate the vasculature.  I agreed with evaluating the renal arteries to see if renal artery stenting could be performed to help reduce his risk of progressing to dialysis  Past Medical History:  Diagnosis Date   Arthritis    Chronic back pain    Chronic kidney disease    COPD (chronic obstructive pulmonary disease) (HCC)    Diabetes mellitus without complication (HCC)    no meds currently   Diverticulitis    Dyspnea    with exertion   GERD (gastroesophageal reflux disease)    occ -no meds, diet controlled   History of blood transfusion    History of kidney stones    passed stones - no surgery   Iron deficiency    blood transfusions in 09/2020 and 11/2020 - currently getting iron fusions EOW   Peripheral vascular disease (HCC)    aaa 39 mm 02-14-17 epic [place]    Past Surgical History:  Procedure Laterality Date   adenoids removed   age 46   ANTERIOR LAT LUMBAR FUSION N/A 03/26/2020   Procedure: ANTERIOR LATERAL LUMBAR FUSION (XLIF) LUMBAR TWO-THREE WITH POSTERIOR SPINAL FUSION INTERBODY LUMBAR TWO-THREE;  Surgeon: Venita Lick, MD;  [facility];  Service: Orthopedics;  Laterality: N/A;  4 hrs Left tap block with exparel   AV FISTULA PLACEMENT Left 05/10/2021   Procedure: LEFT BRACHIOCEPHALIC ARTERIOVENOUS (AV) FISTULA CREATION;  Surgeon: Sherren Kerns, MD;  [facility] OR;  Service: Vascular;  Laterality: Left;   COLONOSCOPY WITH PROPOFOL N/A 03/07/2017   Procedure: COLONOSCOPY WITH PROPOFOL;  Surgeon: Charolett Bumpers, MD;  [facility];  Service: Endoscopy;  Laterality: N/A;   colonscopy     x 3   KNEE ARTHROSCOPY  [id number]   left knee   TONSILLECTOMY  age 56   TOTAL KNEE ARTHROPLASTY  11/25/2011   Procedure: TOTAL KNEE ARTHROPLASTY;  Surgeon: Nadara Mustard, MD;  [facility];  Service: Orthopedics;  Laterality: Left;  Left Total Knee Arthroplasty    Family History   Problem Relation Age of Onset   Emphysema Father     Social History   Socioeconomic History   Marital status: Married    Spouse name: Not on file   Number of children: Not on file   Years of education: Not on file   Highest education level: Not on file  Occupational History   Not on file  Tobacco Use   Smoking status: Former    Packs/day: 0.25    Years: 30.00    Additional pack years: 0.00    Total pack years: 7.50    Types: Cigarettes    Quit date: 12/25/2013    Years since quitting: 9.2   Smokeless tobacco: Never  Vaping Use   Vaping Use: Never used  Substance and Sexual Activity   Alcohol use: No   Drug use: No   Sexual activity: Not on file  Other Topics Concern   Not on file  Social History Narrative   Not on file   Social Determinants of Health   Financial Resource Strain: Not on file  Food Insecurity: Not on file  Transportation Needs: Not on file  Physical Activity: Not on file  Stress: Not on file  Social Connections: Not on file  Intimate Partner Violence: Not on file    No Known Allergies  Current Outpatient Medications  Medication Sig Dispense Refill   amLODipine (NORVASC) 5 MG tablet Take 5 mg by mouth daily.     atenolol (TENORMIN) 50 MG tablet Take 50 mg by mouth daily.     Cholecalciferol (VITAMIN D) 50 MCG (2000 UT) CAPS Take 2,000 Units by mouth daily.     epoetin alfa-epbx (RETACRIT) 2000 UNIT/ML injection See admin instructions.     SPIRIVA RESPIMAT 2.5 MCG/ACT AERS INHALE 2 PUFFS BY MOUTH INTO THE LUNGS DAILY 4 g 6   vitamin B-12 (CYANOCOBALAMIN) 1000 MCG tablet Take 1,000 mcg by mouth daily.     No current facility-administered medications for this visit.    PHYSICAL EXAM There were no vitals filed for this visit.   Constitutional: Well-appearing man in no acute distress Cardiac: Regular rate and rhythm.  Respiratory:  unlabored. Abdominal:  soft, non-tender, non-distended.  Peripheral vascular: tortuous brachiocephalic  arteriovenous fistula on the left.  PERTINENT LABORATORY AND RADIOLOGIC DATA  Most recent CBC    Latest Ref Rng & Units 04/05/2023    1:38 PM 03/08/2023    2:00 PM 02/08/2023    1:38 PM  CBC  Hemoglobin 13.0 - 17.0 g/dL 16.1  09.6  04.5      Most recent CMP    Latest Ref Rng & Units 08/10/2022    1:50 PM 06/15/2022    2:16 PM 11/17/2021    1:34 PM  CMP  Glucose 70 - 99 mg/dL 409  811  97   BUN 8 - 23 mg/dL 36  38  37   Creatinine 0.61 - 1.24 mg/dL 9.14  7.82  3.75   Sodium 135 - 145 mmol/L 140  141  138   Potassium 3.5 - 5.1 mmol/L 4.2  4.9  4.9   Chloride 98 - 111 mmol/L 110  111  110   CO2 22 - 32 mmol/L 22  24  22    Calcium 8.9 - 10.3 mg/dL 8.6 - 62.9 mg/dL 9.8    9.8  9.3    9.5  9.2    9.2    Abdominal aortic aneurysm duplex shows a 38 mm infrarenal abdominal aortic aneurysm.  Rande Brunt. Lenell Antu, MD [facility] Phone Number: [phone] 04/11/2023 8:04 AM  Total time spent on preparing this encounter including chart review, data review, collecting history, examining the patient, coordinating care for this established patient, 30 minutes  Portions of this report may have been transcribed using voice recognition software.  Every effort has been made to ensure accuracy; however, inadvertent computerized transcription errors may still be present.

## 2023-04-14 ENCOUNTER — Encounter (HOSPITAL_COMMUNITY)
Admission: RE | Admit: 2023-04-14 | Discharge: 2023-04-14 | Disposition: A | Discharge status: Home / Self Care | Payer: PPO | Source: Ambulatory Visit | Attending: Nephrology | Admitting: Nephrology

## 2023-04-14 DIAGNOSIS — N185 Chronic kidney disease, stage 5: Secondary | ICD-10-CM | POA: Diagnosis not present

## 2023-04-14 MED ORDER — SODIUM CHLORIDE 0.9 % IV SOLN
510.0000 mg | INTRAVENOUS | Status: DC
  Administered 2023-04-14: 510 mg via INTRAVENOUS
  Filled 2023-04-14: qty 17

## 2023-04-17 ENCOUNTER — Other Ambulatory Visit: Payer: Self-pay

## 2023-04-17 DIAGNOSIS — N185 Chronic kidney disease, stage 5: Secondary | ICD-10-CM

## 2023-04-27 ENCOUNTER — Ambulatory Visit (HOSPITAL_COMMUNITY)
Admission: RE | Admit: 2023-04-27 | Discharge: 2023-04-27 | Disposition: A | Discharge status: Home / Self Care | Payer: PPO | Source: Ambulatory Visit | Attending: Vascular Surgery | Admitting: Vascular Surgery

## 2023-04-27 DIAGNOSIS — N185 Chronic kidney disease, stage 5: Secondary | ICD-10-CM | POA: Diagnosis not present

## 2023-05-02 ENCOUNTER — Ambulatory Visit (INDEPENDENT_AMBULATORY_CARE_PROVIDER_SITE_OTHER): Payer: Self-pay | Admitting: Vascular Surgery

## 2023-05-02 DIAGNOSIS — N185 Chronic kidney disease, stage 5: Secondary | ICD-10-CM

## 2023-05-02 NOTE — Progress Notes (Signed)
I reviewed the patient's renal duplex with him via telephone.  There is no evidence of flow-limiting atherosclerotic disease.  We do see evidence of medical renal disease.  I counseled him that there is no option for intervention to try to salvage his renal function.  He is understanding and appreciative of the evaluation.  He can follow-up with me as scheduled in 2 to 3 years with repeat duplex of his small abdominal aortic aneurysm.  Please call for any questions in the interim.  Rande Brunt. Lenell Antu, MD [facility] of [facility] Phone Number: [phone] 05/02/2023 12:16 PM

## 2023-05-03 ENCOUNTER — Ambulatory Visit (HOSPITAL_COMMUNITY)
Admission: RE | Admit: 2023-05-03 | Discharge: 2023-05-03 | Disposition: A | Discharge status: Home / Self Care | Payer: PPO | Source: Ambulatory Visit | Attending: Nephrology | Admitting: Nephrology

## 2023-05-03 VITALS — BP 148/72 | HR 53 | Temp 97.0°F | Resp 17

## 2023-05-03 DIAGNOSIS — N189 Chronic kidney disease, unspecified: Secondary | ICD-10-CM | POA: Insufficient documentation

## 2023-05-03 DIAGNOSIS — N179 Acute kidney failure, unspecified: Secondary | ICD-10-CM | POA: Diagnosis not present

## 2023-05-03 DIAGNOSIS — D631 Anemia in chronic kidney disease: Secondary | ICD-10-CM | POA: Insufficient documentation

## 2023-05-03 LAB — IRON AND TIBC
Iron: 143 ug/dL (ref 45–182)
Saturation Ratios: 47 % — ABNORMAL HIGH (ref 17.9–39.5)
TIBC: 307 ug/dL (ref 250–450)
UIBC: 164 ug/dL

## 2023-05-03 LAB — FERRITIN: Ferritin: 649 ng/mL — ABNORMAL HIGH (ref 24–336)

## 2023-05-03 MED ORDER — EPOETIN ALFA-EPBX 40000 UNIT/ML IJ SOLN
INTRAMUSCULAR | Status: AC
  Administered 2023-05-03: 30000 [IU] via SUBCUTANEOUS
  Filled 2023-05-03: qty 1

## 2023-05-03 MED ORDER — EPOETIN ALFA-EPBX 40000 UNIT/ML IJ SOLN: 30000.0000 [IU] | INTRAMUSCULAR | Status: DC

## 2023-05-04 LAB — POCT HEMOGLOBIN-HEMACUE: Hemoglobin: 11.3 g/dL — ABNORMAL LOW (ref 13.0–17.0)

## 2023-05-31 ENCOUNTER — Ambulatory Visit (HOSPITAL_COMMUNITY)
Admission: RE | Admit: 2023-05-31 | Discharge: 2023-05-31 | Disposition: A | Discharge status: Home / Self Care | Payer: PPO | Source: Ambulatory Visit | Attending: Nephrology | Admitting: Nephrology

## 2023-05-31 VITALS — BP 152/67 | HR 51 | Temp 97.2°F | Resp 17

## 2023-05-31 DIAGNOSIS — D631 Anemia in chronic kidney disease: Secondary | ICD-10-CM | POA: Insufficient documentation

## 2023-05-31 DIAGNOSIS — N185 Chronic kidney disease, stage 5: Secondary | ICD-10-CM | POA: Diagnosis not present

## 2023-05-31 DIAGNOSIS — N179 Acute kidney failure, unspecified: Secondary | ICD-10-CM | POA: Insufficient documentation

## 2023-05-31 LAB — IRON AND TIBC
Iron: 97 ug/dL (ref 45–182)
Saturation Ratios: 31 % (ref 17.9–39.5)
TIBC: 311 ug/dL (ref 250–450)
UIBC: 214 ug/dL

## 2023-05-31 LAB — POCT HEMOGLOBIN-HEMACUE: Hemoglobin: 11.2 g/dL — ABNORMAL LOW (ref 13.0–17.0)

## 2023-05-31 LAB — FERRITIN: Ferritin: 723 ng/mL — ABNORMAL HIGH (ref 24–336)

## 2023-05-31 MED ORDER — EPOETIN ALFA-EPBX 40000 UNIT/ML IJ SOLN
30000.0000 [IU] | INTRAMUSCULAR | Status: DC
  Administered 2023-05-31: 30000 [IU] via SUBCUTANEOUS

## 2023-05-31 MED ORDER — EPOETIN ALFA-EPBX 40000 UNIT/ML IJ SOLN
INTRAMUSCULAR | Status: AC
  Filled 2023-05-31: qty 1

## 2023-06-20 DIAGNOSIS — E875 Hyperkalemia: Secondary | ICD-10-CM | POA: Diagnosis not present

## 2023-06-20 DIAGNOSIS — N2581 Secondary hyperparathyroidism of renal origin: Secondary | ICD-10-CM | POA: Diagnosis not present

## 2023-06-20 DIAGNOSIS — I120 Hypertensive chronic kidney disease with stage 5 chronic kidney disease or end stage renal disease: Secondary | ICD-10-CM | POA: Diagnosis not present

## 2023-06-20 DIAGNOSIS — N058 Unspecified nephritic syndrome with other morphologic changes: Secondary | ICD-10-CM | POA: Diagnosis not present

## 2023-06-20 DIAGNOSIS — N057 Unspecified nephritic syndrome with diffuse crescentic glomerulonephritis: Secondary | ICD-10-CM | POA: Diagnosis not present

## 2023-06-20 DIAGNOSIS — N185 Chronic kidney disease, stage 5: Secondary | ICD-10-CM | POA: Diagnosis not present

## 2023-06-20 DIAGNOSIS — E872 Acidosis, unspecified: Secondary | ICD-10-CM | POA: Diagnosis not present

## 2023-06-20 DIAGNOSIS — I770 Arteriovenous fistula, acquired: Secondary | ICD-10-CM | POA: Diagnosis not present

## 2023-06-20 DIAGNOSIS — D631 Anemia in chronic kidney disease: Secondary | ICD-10-CM | POA: Diagnosis not present

## 2023-06-28 ENCOUNTER — Ambulatory Visit (HOSPITAL_COMMUNITY)
Admission: RE | Admit: 2023-06-28 | Discharge: 2023-06-28 | Disposition: A | Discharge status: Home / Self Care | Payer: PPO | Source: Ambulatory Visit | Attending: Nephrology | Admitting: Nephrology

## 2023-06-28 VITALS — BP 142/68 | HR 57 | Temp 97.3°F | Resp 18

## 2023-06-28 DIAGNOSIS — D631 Anemia in chronic kidney disease: Secondary | ICD-10-CM | POA: Diagnosis not present

## 2023-06-28 DIAGNOSIS — N189 Chronic kidney disease, unspecified: Secondary | ICD-10-CM | POA: Diagnosis not present

## 2023-06-28 DIAGNOSIS — N179 Acute kidney failure, unspecified: Secondary | ICD-10-CM | POA: Diagnosis not present

## 2023-06-28 LAB — POCT HEMOGLOBIN-HEMACUE: Hemoglobin: 10.7 g/dL — ABNORMAL LOW (ref 13.0–17.0)

## 2023-06-28 LAB — FERRITIN: Ferritin: 748 ng/mL — ABNORMAL HIGH (ref 24–336)

## 2023-06-28 LAB — IRON AND TIBC
Iron: 99 ug/dL (ref 45–182)
Saturation Ratios: 33 % (ref 17.9–39.5)
TIBC: 297 ug/dL (ref 250–450)
UIBC: 198 ug/dL

## 2023-06-28 MED ORDER — EPOETIN ALFA-EPBX 40000 UNIT/ML IJ SOLN
INTRAMUSCULAR | Status: AC
  Filled 2023-06-28: qty 1

## 2023-06-28 MED ORDER — EPOETIN ALFA-EPBX 40000 UNIT/ML IJ SOLN
30000.0000 [IU] | INTRAMUSCULAR | Status: DC
  Administered 2023-06-28: 30000 [IU] via SUBCUTANEOUS

## 2023-07-26 ENCOUNTER — Ambulatory Visit (HOSPITAL_COMMUNITY)
Admission: RE | Admit: 2023-07-26 | Discharge: 2023-07-26 | Disposition: A | Discharge status: Home / Self Care | Payer: PPO | Source: Ambulatory Visit | Attending: Nephrology | Admitting: Nephrology

## 2023-07-26 VITALS — BP 143/70 | HR 54 | Temp 97.3°F | Resp 19

## 2023-07-26 DIAGNOSIS — N179 Acute kidney failure, unspecified: Secondary | ICD-10-CM | POA: Insufficient documentation

## 2023-07-26 DIAGNOSIS — D631 Anemia in chronic kidney disease: Secondary | ICD-10-CM | POA: Diagnosis not present

## 2023-07-26 DIAGNOSIS — Z79890 Hormone replacement therapy (postmenopausal): Secondary | ICD-10-CM | POA: Insufficient documentation

## 2023-07-26 LAB — IRON AND TIBC
Iron: 92 ug/dL (ref 45–182)
Saturation Ratios: 30 % (ref 17.9–39.5)
TIBC: 308 ug/dL (ref 250–450)
UIBC: 216 ug/dL

## 2023-07-26 LAB — FERRITIN: Ferritin: 813 ng/mL — ABNORMAL HIGH (ref 24–336)

## 2023-07-26 LAB — POCT HEMOGLOBIN-HEMACUE: Hemoglobin: 10.6 g/dL — ABNORMAL LOW (ref 13.0–17.0)

## 2023-07-26 MED ORDER — EPOETIN ALFA-EPBX 40000 UNIT/ML IJ SOLN: 30000.0000 [IU] | INTRAMUSCULAR | Status: DC

## 2023-07-26 MED ORDER — EPOETIN ALFA-EPBX 40000 UNIT/ML IJ SOLN
INTRAMUSCULAR | Status: AC
  Administered 2023-07-26: 30000 [IU] via SUBCUTANEOUS
  Filled 2023-07-26: qty 1

## 2023-08-23 ENCOUNTER — Ambulatory Visit (HOSPITAL_COMMUNITY)
Admission: RE | Admit: 2023-08-23 | Discharge: 2023-08-23 | Disposition: A | Discharge status: Home / Self Care | Payer: PPO | Source: Ambulatory Visit | Attending: Nephrology | Admitting: Nephrology

## 2023-08-23 VITALS — BP 159/75 | HR 58 | Temp 97.3°F | Resp 16

## 2023-08-23 DIAGNOSIS — N185 Chronic kidney disease, stage 5: Secondary | ICD-10-CM | POA: Diagnosis not present

## 2023-08-23 DIAGNOSIS — N179 Acute kidney failure, unspecified: Secondary | ICD-10-CM

## 2023-08-23 DIAGNOSIS — D631 Anemia in chronic kidney disease: Secondary | ICD-10-CM | POA: Insufficient documentation

## 2023-08-23 LAB — IRON AND TIBC
Iron: 75 ug/dL (ref 45–182)
Saturation Ratios: 23 % (ref 17.9–39.5)
TIBC: 323 ug/dL (ref 250–450)
UIBC: 248 ug/dL

## 2023-08-23 LAB — FERRITIN: Ferritin: 668 ng/mL — ABNORMAL HIGH (ref 24–336)

## 2023-08-23 MED ORDER — EPOETIN ALFA-EPBX 40000 UNIT/ML IJ SOLN: 30000.0000 [IU] | INTRAMUSCULAR | Status: DC

## 2023-08-23 MED ORDER — EPOETIN ALFA-EPBX 40000 UNIT/ML IJ SOLN
INTRAMUSCULAR | Status: AC
  Administered 2023-08-23: 30000 [IU] via SUBCUTANEOUS
  Filled 2023-08-23: qty 1

## 2023-08-24 LAB — POCT HEMOGLOBIN-HEMACUE: Hemoglobin: 11 g/dL — ABNORMAL LOW (ref 13.0–17.0)

## 2023-09-18 DIAGNOSIS — Z8639 Personal history of other endocrine, nutritional and metabolic disease: Secondary | ICD-10-CM | POA: Diagnosis not present

## 2023-09-18 DIAGNOSIS — I7141 Pararenal abdominal aortic aneurysm, without rupture: Secondary | ICD-10-CM | POA: Diagnosis not present

## 2023-09-18 DIAGNOSIS — N185 Chronic kidney disease, stage 5: Secondary | ICD-10-CM | POA: Diagnosis not present

## 2023-09-18 DIAGNOSIS — R1310 Dysphagia, unspecified: Secondary | ICD-10-CM | POA: Diagnosis not present

## 2023-09-18 DIAGNOSIS — N2581 Secondary hyperparathyroidism of renal origin: Secondary | ICD-10-CM | POA: Diagnosis not present

## 2023-09-18 DIAGNOSIS — N058 Unspecified nephritic syndrome with other morphologic changes: Secondary | ICD-10-CM | POA: Diagnosis not present

## 2023-09-18 DIAGNOSIS — Z131 Encounter for screening for diabetes mellitus: Secondary | ICD-10-CM | POA: Diagnosis not present

## 2023-09-18 DIAGNOSIS — J439 Emphysema, unspecified: Secondary | ICD-10-CM | POA: Diagnosis not present

## 2023-09-18 DIAGNOSIS — I10 Essential (primary) hypertension: Secondary | ICD-10-CM | POA: Diagnosis not present

## 2023-09-20 ENCOUNTER — Ambulatory Visit (HOSPITAL_COMMUNITY)
Admission: RE | Admit: 2023-09-20 | Discharge: 2023-09-20 | Disposition: A | Discharge status: Home / Self Care | Payer: PPO | Source: Ambulatory Visit | Attending: Nephrology | Admitting: Nephrology

## 2023-09-20 VITALS — BP 145/47 | HR 56 | Temp 97.6°F | Resp 17

## 2023-09-20 DIAGNOSIS — D631 Anemia in chronic kidney disease: Secondary | ICD-10-CM | POA: Diagnosis not present

## 2023-09-20 DIAGNOSIS — N179 Acute kidney failure, unspecified: Secondary | ICD-10-CM

## 2023-09-20 DIAGNOSIS — N185 Chronic kidney disease, stage 5: Secondary | ICD-10-CM | POA: Insufficient documentation

## 2023-09-20 LAB — IRON AND TIBC
Iron: 117 ug/dL (ref 45–182)
Saturation Ratios: 35 % (ref 17.9–39.5)
TIBC: 333 ug/dL (ref 250–450)
UIBC: 216 ug/dL

## 2023-09-20 LAB — FERRITIN: Ferritin: 790 ng/mL — ABNORMAL HIGH (ref 24–336)

## 2023-09-20 LAB — POCT HEMOGLOBIN-HEMACUE: Hemoglobin: 10.2 g/dL — ABNORMAL LOW (ref 13.0–17.0)

## 2023-09-20 MED ORDER — EPOETIN ALFA-EPBX 40000 UNIT/ML IJ SOLN
30000.0000 [IU] | INTRAMUSCULAR | Status: DC
  Administered 2023-09-20: 30000 [IU] via SUBCUTANEOUS

## 2023-09-20 MED ORDER — EPOETIN ALFA-EPBX 40000 UNIT/ML IJ SOLN
INTRAMUSCULAR | Status: AC
  Filled 2023-09-20: qty 1

## 2023-09-25 DIAGNOSIS — H2513 Age-related nuclear cataract, bilateral: Secondary | ICD-10-CM | POA: Diagnosis not present

## 2023-09-25 DIAGNOSIS — H16223 Keratoconjunctivitis sicca, not specified as Sjogren's, bilateral: Secondary | ICD-10-CM | POA: Diagnosis not present

## 2023-09-28 DIAGNOSIS — I770 Arteriovenous fistula, acquired: Secondary | ICD-10-CM | POA: Diagnosis not present

## 2023-09-28 DIAGNOSIS — N2581 Secondary hyperparathyroidism of renal origin: Secondary | ICD-10-CM | POA: Diagnosis not present

## 2023-09-28 DIAGNOSIS — E872 Acidosis, unspecified: Secondary | ICD-10-CM | POA: Diagnosis not present

## 2023-09-28 DIAGNOSIS — I120 Hypertensive chronic kidney disease with stage 5 chronic kidney disease or end stage renal disease: Secondary | ICD-10-CM | POA: Diagnosis not present

## 2023-09-28 DIAGNOSIS — E875 Hyperkalemia: Secondary | ICD-10-CM | POA: Diagnosis not present

## 2023-09-28 DIAGNOSIS — D631 Anemia in chronic kidney disease: Secondary | ICD-10-CM | POA: Diagnosis not present

## 2023-09-28 DIAGNOSIS — N057 Unspecified nephritic syndrome with diffuse crescentic glomerulonephritis: Secondary | ICD-10-CM | POA: Diagnosis not present

## 2023-09-28 DIAGNOSIS — N058 Unspecified nephritic syndrome with other morphologic changes: Secondary | ICD-10-CM | POA: Diagnosis not present

## 2023-09-28 DIAGNOSIS — N185 Chronic kidney disease, stage 5: Secondary | ICD-10-CM | POA: Diagnosis not present

## 2023-10-10 DIAGNOSIS — N200 Calculus of kidney: Secondary | ICD-10-CM | POA: Diagnosis not present

## 2023-10-10 DIAGNOSIS — N281 Cyst of kidney, acquired: Secondary | ICD-10-CM | POA: Diagnosis not present

## 2023-10-18 ENCOUNTER — Encounter (HOSPITAL_COMMUNITY)
Admission: RE | Admit: 2023-10-18 | Discharge: 2023-10-18 | Disposition: A | Discharge status: Home / Self Care | Payer: PPO | Source: Ambulatory Visit | Attending: Nephrology | Admitting: Nephrology

## 2023-10-18 VITALS — BP 152/73 | HR 54 | Temp 97.1°F | Resp 17

## 2023-10-18 DIAGNOSIS — D631 Anemia in chronic kidney disease: Secondary | ICD-10-CM | POA: Insufficient documentation

## 2023-10-18 DIAGNOSIS — Z79890 Hormone replacement therapy (postmenopausal): Secondary | ICD-10-CM | POA: Diagnosis not present

## 2023-10-18 DIAGNOSIS — N185 Chronic kidney disease, stage 5: Secondary | ICD-10-CM | POA: Insufficient documentation

## 2023-10-18 DIAGNOSIS — N179 Acute kidney failure, unspecified: Secondary | ICD-10-CM | POA: Diagnosis not present

## 2023-10-18 LAB — POCT HEMOGLOBIN-HEMACUE: Hemoglobin: 11.1 g/dL — ABNORMAL LOW (ref 13.0–17.0)

## 2023-10-18 MED ORDER — EPOETIN ALFA-EPBX 40000 UNIT/ML IJ SOLN
INTRAMUSCULAR | Status: AC
  Filled 2023-10-18: qty 1

## 2023-10-18 MED ORDER — EPOETIN ALFA-EPBX 40000 UNIT/ML IJ SOLN
30000.0000 [IU] | INTRAMUSCULAR | Status: DC
  Administered 2023-10-18: 30000 [IU] via SUBCUTANEOUS

## 2023-11-01 ENCOUNTER — Encounter (HOSPITAL_COMMUNITY)
Admission: RE | Admit: 2023-11-01 | Discharge: 2023-11-01 | Disposition: A | Discharge status: Home / Self Care | Payer: PPO | Source: Ambulatory Visit | Attending: Nephrology

## 2023-11-01 VITALS — BP 150/90 | HR 57 | Temp 97.2°F | Resp 17

## 2023-11-01 DIAGNOSIS — N179 Acute kidney failure, unspecified: Secondary | ICD-10-CM

## 2023-11-01 DIAGNOSIS — N185 Chronic kidney disease, stage 5: Secondary | ICD-10-CM | POA: Diagnosis not present

## 2023-11-01 LAB — IRON AND TIBC
Iron: 102 ug/dL (ref 45–182)
Saturation Ratios: 34 % (ref 17.9–39.5)
TIBC: 302 ug/dL (ref 250–450)
UIBC: 200 ug/dL

## 2023-11-01 LAB — POCT HEMOGLOBIN-HEMACUE: Hemoglobin: 11.2 g/dL — ABNORMAL LOW (ref 13.0–17.0)

## 2023-11-01 LAB — FERRITIN: Ferritin: 692 ng/mL — ABNORMAL HIGH (ref 24–336)

## 2023-11-01 MED ORDER — EPOETIN ALFA-EPBX 40000 UNIT/ML IJ SOLN
INTRAMUSCULAR | Status: AC
  Filled 2023-11-01: qty 1

## 2023-11-01 MED ORDER — EPOETIN ALFA-EPBX 40000 UNIT/ML IJ SOLN
30000.0000 [IU] | INTRAMUSCULAR | Status: DC
  Administered 2023-11-01: 30000 [IU] via SUBCUTANEOUS

## 2023-11-14 ENCOUNTER — Other Ambulatory Visit (HOSPITAL_COMMUNITY): Payer: Self-pay | Admitting: Internal Medicine

## 2023-11-14 DIAGNOSIS — R1310 Dysphagia, unspecified: Secondary | ICD-10-CM

## 2023-11-16 ENCOUNTER — Encounter (HOSPITAL_COMMUNITY)
Admission: RE | Admit: 2023-11-16 | Discharge: 2023-11-16 | Disposition: A | Discharge status: Home / Self Care | Payer: PPO | Source: Ambulatory Visit | Attending: Nephrology | Admitting: Nephrology

## 2023-11-16 VITALS — BP 144/72 | HR 58 | Temp 97.1°F | Resp 17

## 2023-11-16 DIAGNOSIS — N179 Acute kidney failure, unspecified: Secondary | ICD-10-CM | POA: Insufficient documentation

## 2023-11-16 DIAGNOSIS — D631 Anemia in chronic kidney disease: Secondary | ICD-10-CM | POA: Insufficient documentation

## 2023-11-16 DIAGNOSIS — N185 Chronic kidney disease, stage 5: Secondary | ICD-10-CM | POA: Insufficient documentation

## 2023-11-16 LAB — POCT HEMOGLOBIN-HEMACUE: Hemoglobin: 11.7 g/dL — ABNORMAL LOW (ref 13.0–17.0)

## 2023-11-16 MED ORDER — EPOETIN ALFA-EPBX 40000 UNIT/ML IJ SOLN
INTRAMUSCULAR | Status: AC
  Filled 2023-11-16: qty 1

## 2023-11-16 MED ORDER — EPOETIN ALFA-EPBX 40000 UNIT/ML IJ SOLN
30000.0000 [IU] | INTRAMUSCULAR | Status: DC
Start: 2023-11-16 — End: 2023-11-17
  Administered 2023-11-16: 30000 [IU] via SUBCUTANEOUS

## 2023-11-24 ENCOUNTER — Ambulatory Visit (HOSPITAL_COMMUNITY)
Admission: RE | Admit: 2023-11-24 | Discharge: 2023-11-24 | Disposition: A | Discharge status: Home / Self Care | Payer: PPO | Source: Ambulatory Visit | Attending: Internal Medicine | Admitting: Internal Medicine

## 2023-11-24 DIAGNOSIS — R1310 Dysphagia, unspecified: Secondary | ICD-10-CM | POA: Diagnosis present

## 2023-11-29 ENCOUNTER — Encounter (HOSPITAL_COMMUNITY)
Admission: RE | Admit: 2023-11-29 | Discharge: 2023-11-29 | Disposition: A | Discharge status: Home / Self Care | Payer: PPO | Source: Ambulatory Visit | Attending: Nephrology

## 2023-11-29 VITALS — BP 144/76 | HR 56 | Temp 97.1°F | Resp 17

## 2023-11-29 DIAGNOSIS — N179 Acute kidney failure, unspecified: Secondary | ICD-10-CM

## 2023-11-29 DIAGNOSIS — N185 Chronic kidney disease, stage 5: Secondary | ICD-10-CM | POA: Diagnosis not present

## 2023-11-29 LAB — IRON AND TIBC
Iron: 72 ug/dL (ref 45–182)
Saturation Ratios: 24 % (ref 17.9–39.5)
TIBC: 295 ug/dL (ref 250–450)
UIBC: 223 ug/dL

## 2023-11-29 LAB — FERRITIN: Ferritin: 606 ng/mL — ABNORMAL HIGH (ref 24–336)

## 2023-11-29 LAB — POCT HEMOGLOBIN-HEMACUE: Hemoglobin: 12.1 g/dL — ABNORMAL LOW (ref 13.0–17.0)

## 2023-11-29 MED ORDER — EPOETIN ALFA-EPBX 40000 UNIT/ML IJ SOLN: 30000.0000 [IU] | INTRAMUSCULAR | Status: DC

## 2023-11-30 ENCOUNTER — Encounter (HOSPITAL_COMMUNITY): Payer: PPO

## 2023-12-13 ENCOUNTER — Ambulatory Visit (HOSPITAL_COMMUNITY)
Admission: RE | Admit: 2023-12-13 | Discharge: 2023-12-13 | Disposition: A | Discharge status: Home / Self Care | Payer: PPO | Source: Ambulatory Visit | Attending: Nephrology | Admitting: Nephrology

## 2023-12-13 VITALS — BP 124/71 | HR 58 | Temp 97.3°F | Resp 16

## 2023-12-13 DIAGNOSIS — N179 Acute kidney failure, unspecified: Secondary | ICD-10-CM | POA: Diagnosis present

## 2023-12-13 LAB — POCT HEMOGLOBIN-HEMACUE: Hemoglobin: 11.8 g/dL — ABNORMAL LOW (ref 13.0–17.0)

## 2023-12-13 MED ORDER — EPOETIN ALFA-EPBX 40000 UNIT/ML IJ SOLN
30000.0000 [IU] | INTRAMUSCULAR | Status: DC
Start: 2023-12-13 — End: 2024-12-25
  Administered 2023-12-13: 30000 [IU] via SUBCUTANEOUS

## 2023-12-13 MED ORDER — EPOETIN ALFA-EPBX 40000 UNIT/ML IJ SOLN
INTRAMUSCULAR | Status: AC
  Filled 2023-12-13: qty 1

## 2023-12-27 ENCOUNTER — Ambulatory Visit (HOSPITAL_COMMUNITY)
Admission: RE | Admit: 2023-12-27 | Discharge: 2023-12-27 | Disposition: A | Discharge status: Home / Self Care | Payer: PPO | Source: Ambulatory Visit | Attending: Nephrology | Admitting: Nephrology

## 2023-12-27 VITALS — BP 147/74 | HR 61 | Temp 97.6°F | Resp 16

## 2023-12-27 DIAGNOSIS — Z79890 Hormone replacement therapy (postmenopausal): Secondary | ICD-10-CM | POA: Insufficient documentation

## 2023-12-27 DIAGNOSIS — N185 Chronic kidney disease, stage 5: Secondary | ICD-10-CM | POA: Diagnosis not present

## 2023-12-27 DIAGNOSIS — N179 Acute kidney failure, unspecified: Secondary | ICD-10-CM | POA: Insufficient documentation

## 2023-12-27 DIAGNOSIS — D631 Anemia in chronic kidney disease: Secondary | ICD-10-CM | POA: Insufficient documentation

## 2023-12-27 LAB — FERRITIN: Ferritin: 780 ng/mL — ABNORMAL HIGH (ref 24–336)

## 2023-12-27 LAB — IRON AND TIBC
Iron: 89 ug/dL (ref 45–182)
Saturation Ratios: 28 % (ref 17.9–39.5)
TIBC: 318 ug/dL (ref 250–450)
UIBC: 229 ug/dL

## 2023-12-27 LAB — POCT HEMOGLOBIN-HEMACUE: Hemoglobin: 12.2 g/dL — ABNORMAL LOW (ref 13.0–17.0)

## 2023-12-27 MED ORDER — EPOETIN ALFA-EPBX 40000 UNIT/ML IJ SOLN: 30000.0000 [IU] | INTRAMUSCULAR | Status: DC

## 2024-01-10 ENCOUNTER — Encounter (HOSPITAL_COMMUNITY)
Admission: RE | Admit: 2024-01-10 | Discharge: 2024-01-10 | Disposition: A | Discharge status: Home / Self Care | Payer: PPO | Source: Ambulatory Visit | Attending: Nephrology | Admitting: Nephrology

## 2024-01-10 VITALS — BP 165/81 | HR 50 | Temp 97.7°F | Resp 16

## 2024-01-10 DIAGNOSIS — N179 Acute kidney failure, unspecified: Secondary | ICD-10-CM | POA: Insufficient documentation

## 2024-01-10 LAB — POCT HEMOGLOBIN-HEMACUE: Hemoglobin: 12 g/dL — ABNORMAL LOW (ref 13.0–17.0)

## 2024-01-10 MED ORDER — EPOETIN ALFA-EPBX 40000 UNIT/ML IJ SOLN: 30000.0000 [IU] | INTRAMUSCULAR | Status: DC

## 2024-01-10 MED ORDER — EPOETIN ALFA-EPBX 40000 UNIT/ML IJ SOLN
INTRAMUSCULAR | Status: AC
  Filled 2024-01-10: qty 1

## 2024-01-17 ENCOUNTER — Encounter: Payer: Self-pay | Admitting: Internal Medicine

## 2024-01-24 ENCOUNTER — Ambulatory Visit (HOSPITAL_COMMUNITY)
Admission: RE | Admit: 2024-01-24 | Discharge: 2024-01-24 | Disposition: A | Discharge status: Home / Self Care | Payer: PPO | Source: Ambulatory Visit | Attending: Nephrology | Admitting: Nephrology

## 2024-01-24 VITALS — BP 153/81 | HR 61 | Temp 97.0°F | Resp 16

## 2024-01-24 DIAGNOSIS — D631 Anemia in chronic kidney disease: Secondary | ICD-10-CM | POA: Insufficient documentation

## 2024-01-24 DIAGNOSIS — N179 Acute kidney failure, unspecified: Secondary | ICD-10-CM | POA: Insufficient documentation

## 2024-01-24 DIAGNOSIS — N185 Chronic kidney disease, stage 5: Secondary | ICD-10-CM | POA: Insufficient documentation

## 2024-01-24 LAB — IRON AND TIBC
Iron: 83 ug/dL (ref 45–182)
Saturation Ratios: 26 % (ref 17.9–39.5)
TIBC: 321 ug/dL (ref 250–450)
UIBC: 238 ug/dL

## 2024-01-24 LAB — FERRITIN: Ferritin: 783 ng/mL — ABNORMAL HIGH (ref 24–336)

## 2024-01-24 LAB — POCT HEMOGLOBIN-HEMACUE: Hemoglobin: 12.2 g/dL — ABNORMAL LOW (ref 13.0–17.0)

## 2024-01-24 MED ORDER — EPOETIN ALFA-EPBX 40000 UNIT/ML IJ SOLN: 30000.0000 [IU] | INTRAMUSCULAR | Status: DC

## 2024-02-07 ENCOUNTER — Ambulatory Visit (HOSPITAL_COMMUNITY)
Admission: RE | Admit: 2024-02-07 | Discharge: 2024-02-07 | Disposition: A | Discharge status: Home / Self Care | Source: Ambulatory Visit | Attending: Nephrology | Admitting: Nephrology

## 2024-02-07 VITALS — BP 157/70 | HR 55 | Temp 97.8°F | Resp 16

## 2024-02-07 DIAGNOSIS — N179 Acute kidney failure, unspecified: Secondary | ICD-10-CM

## 2024-02-07 DIAGNOSIS — N185 Chronic kidney disease, stage 5: Secondary | ICD-10-CM | POA: Diagnosis not present

## 2024-02-07 LAB — POCT HEMOGLOBIN-HEMACUE: Hemoglobin: 11.7 g/dL — ABNORMAL LOW (ref 13.0–17.0)

## 2024-02-07 MED ORDER — EPOETIN ALFA-EPBX 40000 UNIT/ML IJ SOLN
INTRAMUSCULAR | Status: AC
  Filled 2024-02-07: qty 1

## 2024-02-07 MED ORDER — EPOETIN ALFA-EPBX 40000 UNIT/ML IJ SOLN
30000.0000 [IU] | INTRAMUSCULAR | Status: DC
  Administered 2024-02-07: 30000 [IU] via SUBCUTANEOUS

## 2024-02-12 DIAGNOSIS — N185 Chronic kidney disease, stage 5: Secondary | ICD-10-CM | POA: Diagnosis not present

## 2024-02-12 DIAGNOSIS — E872 Acidosis, unspecified: Secondary | ICD-10-CM | POA: Diagnosis not present

## 2024-02-12 DIAGNOSIS — N058 Unspecified nephritic syndrome with other morphologic changes: Secondary | ICD-10-CM | POA: Diagnosis not present

## 2024-02-12 DIAGNOSIS — E875 Hyperkalemia: Secondary | ICD-10-CM | POA: Diagnosis not present

## 2024-02-12 DIAGNOSIS — I129 Hypertensive chronic kidney disease with stage 1 through stage 4 chronic kidney disease, or unspecified chronic kidney disease: Secondary | ICD-10-CM | POA: Diagnosis not present

## 2024-02-12 DIAGNOSIS — N189 Chronic kidney disease, unspecified: Secondary | ICD-10-CM | POA: Diagnosis not present

## 2024-02-12 DIAGNOSIS — N2581 Secondary hyperparathyroidism of renal origin: Secondary | ICD-10-CM | POA: Diagnosis not present

## 2024-02-12 DIAGNOSIS — D631 Anemia in chronic kidney disease: Secondary | ICD-10-CM | POA: Diagnosis not present

## 2024-02-12 DIAGNOSIS — N057 Unspecified nephritic syndrome with diffuse crescentic glomerulonephritis: Secondary | ICD-10-CM | POA: Diagnosis not present

## 2024-02-12 DIAGNOSIS — I120 Hypertensive chronic kidney disease with stage 5 chronic kidney disease or end stage renal disease: Secondary | ICD-10-CM | POA: Diagnosis not present

## 2024-02-12 DIAGNOSIS — I770 Arteriovenous fistula, acquired: Secondary | ICD-10-CM | POA: Diagnosis not present

## 2024-02-21 ENCOUNTER — Encounter (HOSPITAL_COMMUNITY)
Admission: RE | Admit: 2024-02-21 | Discharge: 2024-02-21 | Disposition: A | Discharge status: Home / Self Care | Source: Ambulatory Visit | Attending: Nephrology

## 2024-02-21 VITALS — BP 156/87 | HR 53 | Temp 97.1°F | Resp 16

## 2024-02-21 DIAGNOSIS — D631 Anemia in chronic kidney disease: Secondary | ICD-10-CM | POA: Diagnosis not present

## 2024-02-21 DIAGNOSIS — Z79890 Hormone replacement therapy (postmenopausal): Secondary | ICD-10-CM | POA: Insufficient documentation

## 2024-02-21 DIAGNOSIS — N179 Acute kidney failure, unspecified: Secondary | ICD-10-CM | POA: Diagnosis not present

## 2024-02-21 DIAGNOSIS — N485 Ulcer of penis: Secondary | ICD-10-CM | POA: Diagnosis not present

## 2024-02-21 LAB — FERRITIN: Ferritin: 905 ng/mL — ABNORMAL HIGH (ref 24–336)

## 2024-02-21 LAB — IRON AND TIBC
Iron: 91 ug/dL (ref 45–182)
Saturation Ratios: 29 % (ref 17.9–39.5)
TIBC: 314 ug/dL (ref 250–450)
UIBC: 223 ug/dL

## 2024-02-21 LAB — POCT HEMOGLOBIN-HEMACUE: Hemoglobin: 11.9 g/dL — ABNORMAL LOW (ref 13.0–17.0)

## 2024-02-21 MED ORDER — EPOETIN ALFA-EPBX 10000 UNIT/ML IJ SOLN
15000.0000 [IU] | INTRAMUSCULAR | Status: DC
  Administered 2024-02-21: 15000 [IU] via SUBCUTANEOUS

## 2024-02-21 MED ORDER — EPOETIN ALFA-EPBX 10000 UNIT/ML IJ SOLN
INTRAMUSCULAR | Status: AC
  Filled 2024-02-21: qty 2

## 2024-02-27 DIAGNOSIS — M545 Low back pain, unspecified: Secondary | ICD-10-CM | POA: Diagnosis not present

## 2024-03-06 ENCOUNTER — Encounter (HOSPITAL_COMMUNITY)
Admission: RE | Admit: 2024-03-06 | Discharge: 2024-03-06 | Disposition: A | Discharge status: Home / Self Care | Source: Ambulatory Visit | Attending: Nephrology

## 2024-03-06 VITALS — BP 156/64 | HR 52 | Temp 97.5°F | Resp 16

## 2024-03-06 DIAGNOSIS — N179 Acute kidney failure, unspecified: Secondary | ICD-10-CM

## 2024-03-06 LAB — POCT HEMOGLOBIN-HEMACUE: Hemoglobin: 11.1 g/dL — ABNORMAL LOW (ref 13.0–17.0)

## 2024-03-06 MED ORDER — EPOETIN ALFA 10000 UNIT/ML IJ SOLN
INTRAMUSCULAR | Status: AC
  Filled 2024-03-06: qty 2

## 2024-03-06 MED ORDER — EPOETIN ALFA 10000 UNIT/ML IJ SOLN
15000.0000 [IU] | Freq: Once | INTRAMUSCULAR | Status: AC
  Administered 2024-03-06: 15000 [IU] via SUBCUTANEOUS

## 2024-03-07 DIAGNOSIS — M545 Low back pain, unspecified: Secondary | ICD-10-CM | POA: Diagnosis not present

## 2024-03-20 ENCOUNTER — Ambulatory Visit (HOSPITAL_COMMUNITY)
Admission: RE | Admit: 2024-03-20 | Discharge: 2024-03-20 | Disposition: A | Discharge status: Home / Self Care | Source: Ambulatory Visit | Attending: Nephrology | Admitting: Nephrology

## 2024-03-20 VITALS — BP 155/79 | HR 53 | Temp 97.7°F | Resp 17

## 2024-03-20 DIAGNOSIS — N185 Chronic kidney disease, stage 5: Secondary | ICD-10-CM | POA: Insufficient documentation

## 2024-03-20 DIAGNOSIS — Z79890 Hormone replacement therapy (postmenopausal): Secondary | ICD-10-CM | POA: Insufficient documentation

## 2024-03-20 DIAGNOSIS — D631 Anemia in chronic kidney disease: Secondary | ICD-10-CM | POA: Diagnosis not present

## 2024-03-20 DIAGNOSIS — N179 Acute kidney failure, unspecified: Secondary | ICD-10-CM

## 2024-03-20 LAB — IRON AND TIBC
Iron: 94 ug/dL (ref 45–182)
Saturation Ratios: 31 % (ref 17.9–39.5)
TIBC: 308 ug/dL (ref 250–450)
UIBC: 214 ug/dL

## 2024-03-20 LAB — POCT HEMOGLOBIN-HEMACUE: Hemoglobin: 12.2 g/dL — ABNORMAL LOW (ref 13.0–17.0)

## 2024-03-20 LAB — FERRITIN: Ferritin: 779 ng/mL — ABNORMAL HIGH (ref 24–336)

## 2024-03-20 MED ORDER — EPOETIN ALFA-EPBX 10000 UNIT/ML IJ SOLN: 15000.0000 [IU] | Freq: Once | INTRAMUSCULAR | Status: DC

## 2024-03-21 DIAGNOSIS — M545 Low back pain, unspecified: Secondary | ICD-10-CM | POA: Diagnosis not present

## 2024-04-03 ENCOUNTER — Ambulatory Visit (HOSPITAL_COMMUNITY)
Admission: RE | Admit: 2024-04-03 | Discharge: 2024-04-03 | Disposition: A | Discharge status: Home / Self Care | Source: Ambulatory Visit | Attending: Nephrology | Admitting: Nephrology

## 2024-04-03 VITALS — BP 142/73 | HR 58 | Temp 97.4°F | Resp 17

## 2024-04-03 DIAGNOSIS — N179 Acute kidney failure, unspecified: Secondary | ICD-10-CM | POA: Diagnosis not present

## 2024-04-03 LAB — POCT HEMOGLOBIN-HEMACUE: Hemoglobin: 12 g/dL — ABNORMAL LOW (ref 13.0–17.0)

## 2024-04-03 MED ORDER — EPOETIN ALFA-EPBX 10000 UNIT/ML IJ SOLN
15000.0000 [IU] | Freq: Once | INTRAMUSCULAR | Status: DC
Start: 2024-04-03 — End: 2024-04-04

## 2024-04-03 MED ORDER — EPOETIN ALFA-EPBX 3000 UNIT/ML IJ SOLN: 3000.0000 [IU] | Freq: Once | INTRAMUSCULAR | Status: DC

## 2024-04-03 MED ORDER — EPOETIN ALFA-EPBX 2000 UNIT/ML IJ SOLN: 2000.0000 [IU] | Freq: Once | INTRAMUSCULAR | Status: DC

## 2024-04-05 DIAGNOSIS — M47816 Spondylosis without myelopathy or radiculopathy, lumbar region: Secondary | ICD-10-CM | POA: Diagnosis not present

## 2024-04-15 ENCOUNTER — Telehealth: Payer: Self-pay

## 2024-04-15 NOTE — Telephone Encounter (Signed)
 Auth Submission: NO AUTH NEEDED Site of care: Site of care: [facility] Payer: health team advtg ppo Medication & CPT/J Code(s) submitted: Retacrit  Q5106 Route of submission (phone, fax, portal): phone Phone # Fax # Auth type: Buy/Bill PB Units/visits requested: 15,000u, q2weeks Reference number: [id number] Approval from: 04/15/24 to 11/13/24

## 2024-04-17 ENCOUNTER — Encounter (HOSPITAL_COMMUNITY)
Admission: RE | Admit: 2024-04-17 | Discharge: 2024-04-17 | Disposition: A | Discharge status: Home / Self Care | Source: Ambulatory Visit | Attending: Nephrology | Admitting: Nephrology

## 2024-04-17 VITALS — BP 151/73 | HR 56 | Temp 97.1°F | Resp 17

## 2024-04-17 DIAGNOSIS — Z79890 Hormone replacement therapy (postmenopausal): Secondary | ICD-10-CM | POA: Insufficient documentation

## 2024-04-17 DIAGNOSIS — N179 Acute kidney failure, unspecified: Secondary | ICD-10-CM | POA: Insufficient documentation

## 2024-04-17 DIAGNOSIS — N185 Chronic kidney disease, stage 5: Secondary | ICD-10-CM | POA: Diagnosis not present

## 2024-04-17 DIAGNOSIS — D631 Anemia in chronic kidney disease: Secondary | ICD-10-CM | POA: Insufficient documentation

## 2024-04-17 LAB — IRON AND TIBC
Iron: 74 ug/dL (ref 45–182)
Saturation Ratios: 27 % (ref 17.9–39.5)
TIBC: 270 ug/dL (ref 250–450)
UIBC: 196 ug/dL

## 2024-04-17 LAB — POCT HEMOGLOBIN-HEMACUE: Hemoglobin: 11 g/dL — ABNORMAL LOW (ref 13.0–17.0)

## 2024-04-17 LAB — FERRITIN: Ferritin: 827 ng/mL — ABNORMAL HIGH (ref 24–336)

## 2024-04-17 MED ORDER — EPOETIN ALFA-EPBX 10000 UNIT/ML IJ SOLN
15000.0000 [IU] | INTRAMUSCULAR | Status: DC
  Administered 2024-04-17: 15000 [IU] via SUBCUTANEOUS

## 2024-04-17 MED ORDER — EPOETIN ALFA-EPBX 10000 UNIT/ML IJ SOLN
INTRAMUSCULAR | Status: AC
  Filled 2024-04-17: qty 2

## 2024-04-22 DIAGNOSIS — M47816 Spondylosis without myelopathy or radiculopathy, lumbar region: Secondary | ICD-10-CM | POA: Diagnosis not present

## 2024-04-24 DIAGNOSIS — N2581 Secondary hyperparathyroidism of renal origin: Secondary | ICD-10-CM | POA: Diagnosis not present

## 2024-04-24 DIAGNOSIS — E1159 Type 2 diabetes mellitus with other circulatory complications: Secondary | ICD-10-CM | POA: Diagnosis not present

## 2024-04-24 DIAGNOSIS — D638 Anemia in other chronic diseases classified elsewhere: Secondary | ICD-10-CM | POA: Diagnosis not present

## 2024-04-24 DIAGNOSIS — R1310 Dysphagia, unspecified: Secondary | ICD-10-CM | POA: Diagnosis not present

## 2024-04-24 DIAGNOSIS — I10 Essential (primary) hypertension: Secondary | ICD-10-CM | POA: Diagnosis not present

## 2024-04-24 DIAGNOSIS — J439 Emphysema, unspecified: Secondary | ICD-10-CM | POA: Diagnosis not present

## 2024-04-24 DIAGNOSIS — Z0000 Encounter for general adult medical examination without abnormal findings: Secondary | ICD-10-CM | POA: Diagnosis not present

## 2024-04-24 DIAGNOSIS — Z23 Encounter for immunization: Secondary | ICD-10-CM | POA: Diagnosis not present

## 2024-04-24 DIAGNOSIS — I358 Other nonrheumatic aortic valve disorders: Secondary | ICD-10-CM | POA: Diagnosis not present

## 2024-04-24 DIAGNOSIS — Z125 Encounter for screening for malignant neoplasm of prostate: Secondary | ICD-10-CM | POA: Diagnosis not present

## 2024-04-24 DIAGNOSIS — N019 Rapidly progressive nephritic syndrome with unspecified morphologic changes: Secondary | ICD-10-CM | POA: Diagnosis not present

## 2024-04-24 DIAGNOSIS — N058 Unspecified nephritic syndrome with other morphologic changes: Secondary | ICD-10-CM | POA: Diagnosis not present

## 2024-04-24 DIAGNOSIS — N185 Chronic kidney disease, stage 5: Secondary | ICD-10-CM | POA: Diagnosis not present

## 2024-04-30 DIAGNOSIS — M47816 Spondylosis without myelopathy or radiculopathy, lumbar region: Secondary | ICD-10-CM | POA: Diagnosis not present

## 2024-05-01 ENCOUNTER — Ambulatory Visit (HOSPITAL_COMMUNITY)
Admission: RE | Admit: 2024-05-01 | Discharge: 2024-05-01 | Disposition: A | Discharge status: Home / Self Care | Source: Ambulatory Visit | Attending: Nephrology | Admitting: Nephrology

## 2024-05-01 VITALS — BP 132/62 | HR 51 | Temp 97.9°F | Resp 18

## 2024-05-01 DIAGNOSIS — N185 Chronic kidney disease, stage 5: Secondary | ICD-10-CM | POA: Diagnosis not present

## 2024-05-01 DIAGNOSIS — D631 Anemia in chronic kidney disease: Secondary | ICD-10-CM | POA: Insufficient documentation

## 2024-05-01 DIAGNOSIS — N179 Acute kidney failure, unspecified: Secondary | ICD-10-CM | POA: Diagnosis not present

## 2024-05-01 MED ORDER — EPOETIN ALFA-EPBX 10000 UNIT/ML IJ SOLN
INTRAMUSCULAR | Status: AC
  Filled 2024-05-01: qty 2

## 2024-05-01 MED ORDER — EPOETIN ALFA-EPBX 10000 UNIT/ML IJ SOLN
15000.0000 [IU] | INTRAMUSCULAR | Status: DC
  Administered 2024-05-01: 15000 [IU] via SUBCUTANEOUS

## 2024-05-02 LAB — POCT HEMOGLOBIN-HEMACUE: Hemoglobin: 10.7 g/dL — ABNORMAL LOW (ref 13.0–17.0)

## 2024-05-14 DIAGNOSIS — M47816 Spondylosis without myelopathy or radiculopathy, lumbar region: Secondary | ICD-10-CM | POA: Diagnosis not present

## 2024-05-15 ENCOUNTER — Encounter (HOSPITAL_COMMUNITY)
Admission: RE | Admit: 2024-05-15 | Discharge: 2024-05-15 | Disposition: A | Discharge status: Home / Self Care | Source: Ambulatory Visit | Attending: Nephrology | Admitting: Nephrology

## 2024-05-15 VITALS — BP 137/88 | HR 53 | Temp 97.7°F | Resp 17

## 2024-05-15 DIAGNOSIS — N179 Acute kidney failure, unspecified: Secondary | ICD-10-CM | POA: Insufficient documentation

## 2024-05-15 DIAGNOSIS — N185 Chronic kidney disease, stage 5: Secondary | ICD-10-CM | POA: Insufficient documentation

## 2024-05-15 DIAGNOSIS — D631 Anemia in chronic kidney disease: Secondary | ICD-10-CM | POA: Diagnosis not present

## 2024-05-15 LAB — IRON AND TIBC
Iron: 126 ug/dL (ref 45–182)
Saturation Ratios: 42 % — ABNORMAL HIGH (ref 17.9–39.5)
TIBC: 304 ug/dL (ref 250–450)
UIBC: 178 ug/dL

## 2024-05-15 LAB — POCT HEMOGLOBIN-HEMACUE: Hemoglobin: 11.5 g/dL — ABNORMAL LOW (ref 13.0–17.0)

## 2024-05-15 LAB — FERRITIN: Ferritin: 675 ng/mL — ABNORMAL HIGH (ref 24–336)

## 2024-05-15 MED ORDER — EPOETIN ALFA-EPBX 10000 UNIT/ML IJ SOLN
INTRAMUSCULAR | Status: AC
Start: 2024-05-15 — End: 2024-05-15
  Filled 2024-05-15: qty 2

## 2024-05-15 MED ORDER — EPOETIN ALFA-EPBX 10000 UNIT/ML IJ SOLN
15000.0000 [IU] | INTRAMUSCULAR | Status: DC
  Administered 2024-05-15: 15000 [IU] via SUBCUTANEOUS

## 2024-05-22 DIAGNOSIS — M47816 Spondylosis without myelopathy or radiculopathy, lumbar region: Secondary | ICD-10-CM | POA: Diagnosis not present

## 2024-05-28 DIAGNOSIS — M25561 Pain in right knee: Secondary | ICD-10-CM | POA: Diagnosis not present

## 2024-05-29 ENCOUNTER — Encounter (HOSPITAL_COMMUNITY)
Admission: RE | Admit: 2024-05-29 | Discharge: 2024-05-29 | Disposition: A | Discharge status: Home / Self Care | Source: Ambulatory Visit | Attending: Nephrology

## 2024-05-29 VITALS — BP 154/70 | HR 52 | Temp 97.8°F | Resp 17

## 2024-05-29 DIAGNOSIS — N179 Acute kidney failure, unspecified: Secondary | ICD-10-CM | POA: Diagnosis not present

## 2024-05-29 LAB — POCT HEMOGLOBIN-HEMACUE: Hemoglobin: 11.8 g/dL — ABNORMAL LOW (ref 13.0–17.0)

## 2024-05-29 MED ORDER — EPOETIN ALFA-EPBX 10000 UNIT/ML IJ SOLN
INTRAMUSCULAR | Status: AC
Start: 2024-05-29 — End: 2024-05-29
  Filled 2024-05-29: qty 1

## 2024-05-29 MED ORDER — EPOETIN ALFA-EPBX 10000 UNIT/ML IJ SOLN
INTRAMUSCULAR | Status: AC
Start: 2024-05-29 — End: 2024-05-29
  Filled 2024-05-29: qty 2

## 2024-05-29 MED ORDER — EPOETIN ALFA-EPBX 10000 UNIT/ML IJ SOLN
15000.0000 [IU] | INTRAMUSCULAR | Status: DC
  Administered 2024-05-29: 15000 [IU] via SUBCUTANEOUS

## 2024-05-30 ENCOUNTER — Encounter: Payer: Self-pay | Admitting: Nurse Practitioner

## 2024-06-04 DIAGNOSIS — M47816 Spondylosis without myelopathy or radiculopathy, lumbar region: Secondary | ICD-10-CM | POA: Diagnosis not present

## 2024-06-04 DIAGNOSIS — N185 Chronic kidney disease, stage 5: Secondary | ICD-10-CM | POA: Diagnosis not present

## 2024-06-10 DIAGNOSIS — E872 Acidosis, unspecified: Secondary | ICD-10-CM | POA: Diagnosis not present

## 2024-06-10 DIAGNOSIS — I120 Hypertensive chronic kidney disease with stage 5 chronic kidney disease or end stage renal disease: Secondary | ICD-10-CM | POA: Diagnosis not present

## 2024-06-10 DIAGNOSIS — E875 Hyperkalemia: Secondary | ICD-10-CM | POA: Diagnosis not present

## 2024-06-10 DIAGNOSIS — N185 Chronic kidney disease, stage 5: Secondary | ICD-10-CM | POA: Diagnosis not present

## 2024-06-10 DIAGNOSIS — I770 Arteriovenous fistula, acquired: Secondary | ICD-10-CM | POA: Diagnosis not present

## 2024-06-10 DIAGNOSIS — N2581 Secondary hyperparathyroidism of renal origin: Secondary | ICD-10-CM | POA: Diagnosis not present

## 2024-06-10 DIAGNOSIS — D631 Anemia in chronic kidney disease: Secondary | ICD-10-CM | POA: Diagnosis not present

## 2024-06-10 DIAGNOSIS — N058 Unspecified nephritic syndrome with other morphologic changes: Secondary | ICD-10-CM | POA: Diagnosis not present

## 2024-06-10 DIAGNOSIS — N057 Unspecified nephritic syndrome with diffuse crescentic glomerulonephritis: Secondary | ICD-10-CM | POA: Diagnosis not present

## 2024-06-12 ENCOUNTER — Ambulatory Visit (HOSPITAL_COMMUNITY)
Admission: RE | Admit: 2024-06-12 | Discharge: 2024-06-12 | Disposition: A | Discharge status: Home / Self Care | Source: Ambulatory Visit | Attending: Nephrology | Admitting: Nephrology

## 2024-06-12 ENCOUNTER — Encounter (HOSPITAL_COMMUNITY)

## 2024-06-12 VITALS — BP 161/83 | HR 49 | Temp 97.5°F | Resp 16

## 2024-06-12 DIAGNOSIS — N185 Chronic kidney disease, stage 5: Secondary | ICD-10-CM | POA: Diagnosis not present

## 2024-06-12 DIAGNOSIS — N184 Chronic kidney disease, stage 4 (severe): Secondary | ICD-10-CM | POA: Diagnosis not present

## 2024-06-12 DIAGNOSIS — N179 Acute kidney failure, unspecified: Secondary | ICD-10-CM | POA: Insufficient documentation

## 2024-06-12 DIAGNOSIS — D631 Anemia in chronic kidney disease: Secondary | ICD-10-CM | POA: Insufficient documentation

## 2024-06-12 DIAGNOSIS — M47816 Spondylosis without myelopathy or radiculopathy, lumbar region: Secondary | ICD-10-CM | POA: Diagnosis not present

## 2024-06-12 LAB — POCT HEMOGLOBIN-HEMACUE: Hemoglobin: 11.2 g/dL — ABNORMAL LOW (ref 13.0–17.0)

## 2024-06-12 MED ORDER — EPOETIN ALFA-EPBX 10000 UNIT/ML IJ SOLN
15000.0000 [IU] | INTRAMUSCULAR | Status: DC
  Administered 2024-06-12: 15000 [IU] via SUBCUTANEOUS

## 2024-06-12 MED ORDER — EPOETIN ALFA-EPBX 10000 UNIT/ML IJ SOLN
INTRAMUSCULAR | Status: AC
Start: 2024-06-12 — End: 2024-06-12
  Filled 2024-06-12: qty 2

## 2024-06-20 DIAGNOSIS — M47816 Spondylosis without myelopathy or radiculopathy, lumbar region: Secondary | ICD-10-CM | POA: Diagnosis not present

## 2024-06-26 ENCOUNTER — Ambulatory Visit (HOSPITAL_COMMUNITY)
Admission: RE | Admit: 2024-06-26 | Discharge: 2024-06-26 | Disposition: A | Discharge status: Home / Self Care | Source: Ambulatory Visit | Attending: Nephrology | Admitting: Nephrology

## 2024-06-26 VITALS — BP 151/85 | HR 54 | Temp 97.2°F | Resp 16

## 2024-06-26 DIAGNOSIS — D631 Anemia in chronic kidney disease: Secondary | ICD-10-CM | POA: Diagnosis not present

## 2024-06-26 DIAGNOSIS — N185 Chronic kidney disease, stage 5: Secondary | ICD-10-CM | POA: Insufficient documentation

## 2024-06-26 DIAGNOSIS — N179 Acute kidney failure, unspecified: Secondary | ICD-10-CM

## 2024-06-26 LAB — POCT HEMOGLOBIN-HEMACUE: Hemoglobin: 12 g/dL — ABNORMAL LOW (ref 13.0–17.0)

## 2024-06-26 LAB — FERRITIN: Ferritin: 483 ng/mL — ABNORMAL HIGH (ref 24–336)

## 2024-06-26 LAB — IRON AND TIBC
Iron: 78 ug/dL (ref 45–182)
Saturation Ratios: 24 % (ref 17.9–39.5)
TIBC: 332 ug/dL (ref 250–450)
UIBC: 254 ug/dL

## 2024-06-26 MED ORDER — EPOETIN ALFA-EPBX 10000 UNIT/ML IJ SOLN: 15000.0000 [IU] | INTRAMUSCULAR | Status: DC

## 2024-07-02 DIAGNOSIS — M47816 Spondylosis without myelopathy or radiculopathy, lumbar region: Secondary | ICD-10-CM | POA: Diagnosis not present

## 2024-07-09 DIAGNOSIS — M47816 Spondylosis without myelopathy or radiculopathy, lumbar region: Secondary | ICD-10-CM | POA: Diagnosis not present

## 2024-07-10 ENCOUNTER — Encounter (HOSPITAL_COMMUNITY)
Admission: RE | Admit: 2024-07-10 | Discharge: 2024-07-10 | Disposition: A | Discharge status: Home / Self Care | Source: Ambulatory Visit | Attending: Nephrology | Admitting: Nephrology

## 2024-07-10 VITALS — BP 147/66 | HR 80 | Temp 97.3°F | Resp 16

## 2024-07-10 DIAGNOSIS — N179 Acute kidney failure, unspecified: Secondary | ICD-10-CM | POA: Insufficient documentation

## 2024-07-10 LAB — POCT HEMOGLOBIN-HEMACUE: Hemoglobin: 11.8 g/dL — ABNORMAL LOW (ref 13.0–17.0)

## 2024-07-10 MED ORDER — EPOETIN ALFA-EPBX 10000 UNIT/ML IJ SOLN
15000.0000 [IU] | INTRAMUSCULAR | Status: DC
  Administered 2024-07-10: 15000 [IU] via SUBCUTANEOUS

## 2024-07-10 MED ORDER — EPOETIN ALFA-EPBX 10000 UNIT/ML IJ SOLN
INTRAMUSCULAR | Status: AC
  Filled 2024-07-10: qty 2

## 2024-07-22 ENCOUNTER — Other Ambulatory Visit (HOSPITAL_COMMUNITY): Payer: Self-pay | Admitting: Nephrology

## 2024-07-22 DIAGNOSIS — D631 Anemia in chronic kidney disease: Secondary | ICD-10-CM | POA: Insufficient documentation

## 2024-07-24 ENCOUNTER — Inpatient Hospital Stay (HOSPITAL_COMMUNITY): Admission: RE | Admit: 2024-07-24 | Source: Ambulatory Visit

## 2024-07-24 ENCOUNTER — Ambulatory Visit (HOSPITAL_COMMUNITY)
Admission: RE | Admit: 2024-07-24 | Discharge: 2024-07-24 | Disposition: A | Discharge status: Home / Self Care | Source: Ambulatory Visit | Attending: Nephrology | Admitting: Nephrology

## 2024-07-24 VITALS — BP 143/75 | HR 56 | Temp 97.6°F | Resp 16

## 2024-07-24 DIAGNOSIS — N185 Chronic kidney disease, stage 5: Secondary | ICD-10-CM | POA: Diagnosis not present

## 2024-07-24 DIAGNOSIS — D631 Anemia in chronic kidney disease: Secondary | ICD-10-CM | POA: Diagnosis not present

## 2024-07-24 LAB — POCT HEMOGLOBIN-HEMACUE: Hemoglobin: 11.9 g/dL — ABNORMAL LOW (ref 13.0–17.0)

## 2024-07-24 LAB — IRON AND TIBC
Iron: 114 ug/dL (ref 45–182)
Saturation Ratios: 36 % (ref 17.9–39.5)
TIBC: 318 ug/dL (ref 250–450)
UIBC: 204 ug/dL

## 2024-07-24 LAB — FERRITIN: Ferritin: 482 ng/mL — ABNORMAL HIGH (ref 24–336)

## 2024-07-24 MED ORDER — EPOETIN ALFA-EPBX 10000 UNIT/ML IJ SOLN
15000.0000 [IU] | Freq: Once | INTRAMUSCULAR | Status: AC
  Administered 2024-07-24: 15000 [IU] via SUBCUTANEOUS

## 2024-07-24 MED ORDER — EPOETIN ALFA-EPBX 10000 UNIT/ML IJ SOLN
INTRAMUSCULAR | Status: AC
  Filled 2024-07-24: qty 2

## 2024-07-25 ENCOUNTER — Ambulatory Visit: Admitting: Nurse Practitioner

## 2024-07-25 ENCOUNTER — Telehealth: Payer: Self-pay | Admitting: Internal Medicine

## 2024-07-25 ENCOUNTER — Other Ambulatory Visit (INDEPENDENT_AMBULATORY_CARE_PROVIDER_SITE_OTHER)

## 2024-07-25 VITALS — BP 134/58 | HR 58 | Ht 69.0 in | Wt 179.0 lb

## 2024-07-25 DIAGNOSIS — Z860100 Personal history of colon polyps, unspecified: Secondary | ICD-10-CM

## 2024-07-25 DIAGNOSIS — R1310 Dysphagia, unspecified: Secondary | ICD-10-CM

## 2024-07-25 LAB — COMPREHENSIVE METABOLIC PANEL WITH GFR
ALT: 7 U/L (ref 0–53)
AST: 5 U/L (ref 0–37)
Albumin: 4.1 g/dL (ref 3.5–5.2)
Alkaline Phosphatase: 71 U/L (ref 39–117)
BUN: 59 mg/dL — ABNORMAL HIGH (ref 6–23)
CO2: 24 meq/L (ref 19–32)
Calcium: 8.9 mg/dL (ref 8.4–10.5)
Chloride: 109 meq/L (ref 96–112)
Creatinine, Ser: 5.15 mg/dL (ref 0.40–1.50)
GFR: 10.52 mL/min — CL (ref >60.00)
Glucose, Bld: 98 mg/dL (ref 70–99)
Potassium: 5 meq/L (ref 3.5–5.1)
Sodium: 143 meq/L (ref 135–145)
Total Bilirubin: 0.6 mg/dL (ref 0.2–1.2)
Total Protein: 6.9 g/dL (ref 6.0–8.3)

## 2024-07-25 NOTE — Telephone Encounter (Signed)
 Received a page to the [facility] on call pager.  His CMP lab today shows a critical value of Cr 5.15 and GFR of 10.52. 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 His last Cr in 07/2022 was 4.4 and he has had Cr in the ~5 range in the past. Would ensure that he re-establish with nephrology in the future but this does not seem to be a new issue for him. Will CC Ms. Cara and Pod A nurses to this telephone note.

## 2024-07-25 NOTE — Patient Instructions (Addendum)
 Avoid eating longer pieces of meat ,breads and rice.   Our office will contact you to schedule Upper Endoscopy.   We will request your records from [facility].   Your provider has requested that you go to the basement level for lab work before leaving today. Press B on the elevator. The lab is located at the first door on the left as you exit the elevator.  _______________________________________________________  If your blood pressure at your visit was 140/90 or greater, please contact your primary care physician to follow up on this.  _______________________________________________________  If you are age 50 or older, your body mass index should be between 23-30. Your Body mass index is 26.43 kg/m. If this is out of the aforementioned range listed, please consider follow up with your Primary Care Provider.  If you are age 41 or younger, your body mass index should be between 19-25. Your Body mass index is 26.43 kg/m. If this is out of the aformentioned range listed, please consider follow up with your Primary Care Provider.   ________________________________________________________  The [facility] providers would like to encourage you to use MYCHART to communicate with providers for non-urgent requests or questions.  Due to long hold times on the telephone, sending your provider a message by [facility] may be a faster and more efficient way to get a response.  Please allow 48 business hours for a response.  Please remember that this is for non-urgent requests.  _______________________________________________________  [facility] is using a team-based approach to care.  Your team is made up of your doctor and two to three APPS. Our APPS (Nurse Practitioners and Physician Assistants) work with your physician to ensure care continuity for you. They are fully qualified to address your health concerns and develop a treatment plan. They communicate directly with your gastroenterologist  to care for you. Seeing the Advanced Practice Practitioners on your physician's team can help you by facilitating care more promptly, often allowing for earlier appointments, access to diagnostic testing, procedures, and other specialty referrals.   Thank you for trusting me with your gastrointestinal care!   Elida Shawl, CRNP

## 2024-07-25 NOTE — Progress Notes (Addendum)
 07/25/2024 BRECKON REEVES [id number] 15-Dec-1950   CHIEF COMPLAINT: Difficulty swallowing, discuss scheduling a colonoscopy  HISTORY OF PRESENT ILLNESS: Selinda T. 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 S/P tonsillectomy in the 50s, left total knee arthroplasty 2013, lumbar vertebral fusion 1921 and left upper extremity AV fistula 2022.  He presents to our office today as referred by Dr. Rexanne for further evaluation for dysphagia and to discuss scheduling a future colonoscopy. He is accompanied by his wife. Previously followed by [facility], he wishes to transition his GI management with [facility] gastroenterology.  He describes having food such as bread which gets stuck in his throat or upper esophagus, he stops eating and concentrates on his breathing and the stuck food passes down the esophagus and sometimes he vomits up the stuck food.  Drinking water  does not help during these episodes.  He endorses having a prior history of dysphagia which initially started 5 to 6 years ago.  He underwent an EGD with possible esophageal dilatation by [name] several years ago, he cannot recall further details. He noticed recurrent dysphagia 1 year ago which has progressively worsened over the past 2 months. No difficulty swallowing pills or liquids.  He denies having any heartburn.  He underwent a barium swallow study 11/24/2023 which showed a prominent cricopharyngeal muscle, proximal escape of barium from thoracic to the proximal esophagus and brief lodging of the barium tablet at the GE junction. He is passing a normal formed stool once daily, sometimes twice daily.  No bloody or black stools.  He endorsed undergoing 2 colonoscopy attempts by [facility] which failed due to poor response to a bowel prep.   He stated he completed both bowel preps exactly as prescribed.  He subsequently underwent a virtual colonoscopy 01/31/2023, study was nondiagnostic in the sigmoid colon due to severe diverticular disease and nondistention and everywhere else throughout the colon was unremarkable without obvious polyps or colon mass.  Diffuse left colonic diverticulosis and a 3.8 cm infrarenal abdominal aortic aneurysm were noted.  He stated his PCP is following the abdominal aortic aneurysm.  No known family history of esophageal, gastric or colon cancer.  He takes occasional aspirin or Tylenol .   Dysphagia, bread gets stuck in his throat, he stops and concentrates on breathing and food passes down and sometimes vomits it up, water  not helpful.  Initially started 5 to 6 years ago.  He had one EGD which was dilated. Swallowing improved. Started 1 year ago, worse past 2 months. No heartburn. No difficulty swallowing water  or pills.  He is passing normal formed stools once daily or twice.  No bloody or black stools. No known family history of esophageal, gastric or coon cancer.   He has progressive CKD stage V followed by nephrologist Dr. Charlyne Romney. He stated his creatinine level is in the 4 range. On Erythropoietin injections. He has a left arm AV fistula placed in 2022 as he will likely require future hemodialysis.  He endorsed undergoing kidney transplant evaluation previously at [facility] due to obstructive pulmonary disease.   Labs reviewed by [facility] 04/25/2024: Glucose 106.  BUN 32.  Creatinine 4.51.  GFR 13.  Sodium 143.  Potassium 4.8.  Chloride 107.  CO2 25.  Anion gap 15.1.  Calcium 9.4.  Total protein 6.8.  Albumin 4.3.  Total bili 0.6.  Alk phos 84.  AST 7.  ALT 9.  TSH 1.31.  Hemoglobin A1c 5.6%.  WBC 11.0.  Hemoglobin 12.3.  Hematocrit 36.4.  MCV 100.5.  Platelets 293.  Labs 07/24/2024: Hemoglobin 11.9.  Iron 114.  TIBC 318.  Ferritin 42.  Labs 07/10/2024: Hemoglobin 11.8.  IMAGE  STUDIES:  Barium Swallow study 11/24/2023: FINDINGS: Swallowing: Appears normal. No vestibular penetration or aspiration seen.   Pharynx: Prominent cricopharyngeus muscle.   Esophagus: Normal appearance.  No strictures or mucosal lesions.   Esophageal motility: Some proximal escape of barium from the thoracic to the proximal esophagus.   Hiatal Hernia: None.   Gastroesophageal reflux: No spontaneous gastroesophageal reflux seen. No gastroesophageal reflux seen with cough or Valsalva.   Ingested 13mm barium tablet: Became stuck at the gastroesophageal junction. The tablet finally passed with additional sips of water .   Other: None.   IMPRESSION: 1.  Prominent cricopharyngeus muscle.   2. Proximal escape of barium from the thoracic to the proximal esophagus.   3. Brief lodging of the barium tablet at the gastroesophageal junction.     Virtual colonoscopy 01/31/2023: EXAM: CT VIRTUAL COLONOSCOPY SCREENING   TECHNIQUE: The patient was given a standard bowel preparation with Gastrografin and barium for fluid and stool tagging respectively. The quality of the bowel preparation is fair. Automated CO2 insufflation of the colon was performed prior to image acquisition and colonic distention is poor in the sigmoid colon, good elsewhere throughout the colon. Image post processing was used to generate a 3D endoluminal fly-through projection of the colon and to electronically subtract stool/fluid as appropriate.   COMPARISON:  08/12/2020   FINDINGS: VIRTUAL COLONOSCOPY   There is extensive diverticular disease in the left colon, most pronounced in the sigmoid colon. Nondistention of the sigmoid colon on both supine and prone imaging makes evaluation in this area nondiagnostic. Cannot exclude polyps or annular constricting lesions within the proximal to mid sigmoid colon. Elsewhere throughout the colon, no fixed non barium tagged polypoid filling defects or annular  constricting lesions.   Virtual colonoscopy is not designed to detect diminutive polyps (i.e., less than or equal to 5 mm), the presence or absence of which may not affect clinical management.   CT ABDOMEN AND PELVIS WITHOUT CONTRAST   Lower chest: No acute findings   Hepatobiliary: No focal hepatic abnormality. Gallbladder unremarkable.   Pancreas: No focal abnormality or ductal dilatation.   Spleen: No focal abnormality.  Normal size.   Adrenals/Urinary Tract: Small low-density lesions within right kidney are stable since prior study compatible with small cysts measuring up to 1.9 cm. No follow-up imaging recommended. Adrenal glands unremarkable. No stones or hydronephrosis. Urinary bladder decompressed, grossly unremarkable.   Stomach/Bowel: Stomach and small bowel decompressed, grossly unremarkable.   Vascular/Lymphatic: Aortic atherosclerosis. Aneurysmal dilatation of the infrarenal abdominal aorta measuring 3.8 cm. No adenopathy.   Reproductive: No visible focal abnormality.   Other: No free fluid or free air.   Musculoskeletal: No acute bony abnormality. Postoperative and degenerative changes in the lumbar spine.   IMPRESSION: Study nondiagnostic in the sigmoid colon due to severe diverticular disease and nondistention. Elsewhere throughout the colon, no fixed polypoid filling defects or annular constricting lesions. Recommend optical colonoscopy to evaluate the sigmoid colon.   Diffuse left colonic diverticulosis.   3.8 cm infrarenal abdominal aortic aneurysm. Recommend follow-up ultrasound every 2 years.    Aortic atherosclerosis.  Past Medical History:  Diagnosis Date   AKI (acute kidney injury) (  HCC)    Arthritis    Chronic back pain    Chronic kidney disease    Chronic kidney disease, stage 5, kidney failure (HCC)    COPD (chronic obstructive pulmonary disease) (HCC)    Diabetes mellitus without complication (HCC)    no meds currently    Diverticulitis    Dyspnea    with exertion   GERD (gastroesophageal reflux disease)    occ -no meds, diet controlled   History of blood transfusion    History of kidney stones    passed stones - no surgery   Iron deficiency    blood transfusions in 09/2020 and 11/2020 - currently getting iron fusions EOW   Peripheral vascular disease (HCC)    aaa 39 mm 02-14-17 epic us    Past Surgical History:  Procedure Laterality Date   adenoids removed   age 35   ANTERIOR LAT LUMBAR FUSION N/A 03/26/2020   Procedure: ANTERIOR LATERAL LUMBAR FUSION (XLIF) LUMBAR TWO-THREE WITH POSTERIOR SPINAL FUSION INTERBODY LUMBAR TWO-THREE;  Surgeon: Burnetta Aures, MD;  Location: [facility] OR;  Service: Orthopedics;  Laterality: N/A;  4 hrs Left tap block with exparel    AV FISTULA PLACEMENT Left 05/10/2021   Procedure: LEFT BRACHIOCEPHALIC ARTERIOVENOUS (AV) FISTULA CREATION;  Surgeon: Harvey Carlin BRAVO, MD;  Location: [facility] OR;  Service: Vascular;  Laterality: Left;   COLONOSCOPY WITH PROPOFOL  N/A 03/07/2017   Procedure: COLONOSCOPY WITH PROPOFOL ;  Surgeon: Gladis MARLA Louder, MD;  Location: [facility];  Service: Endoscopy;  Laterality: N/A;   colonscopy     x 3   KNEE ARTHROSCOPY  [id number]   left knee   TONSILLECTOMY  age 8   TOTAL KNEE ARTHROPLASTY  11/25/2011   Procedure: TOTAL KNEE ARTHROPLASTY;  Surgeon: Jerona LULLA Sage, MD;  Location: [facility] OR;  Service: Orthopedics;  Laterality: Left;  Left Total Knee Arthroplasty   Social History: He is married. He has 1 son. He stopped smoking 15 years ago. He occasionally drinks one glass of wine.   Family History: No known family history of esophageal, gastric or colon cancer.  Mother with emphysema.  No Known Allergies   Outpatient Encounter Medications as of 07/25/2024  Medication Sig   amLODipine (NORVASC) 5 MG tablet Take 5 mg by mouth daily.   atenolol  (TENORMIN ) 50 MG tablet Take 50 mg by mouth daily.   Cholecalciferol  (VITAMIN D ) 50 MCG (2000 UT) CAPS Take 2,000 Units by  mouth daily.   epoetin  alfa-epbx (RETACRIT ) 2000 UNIT/ML injection See admin instructions. (Patient taking differently: 2,000 Units every 14 (fourteen) days.)   sodium bicarbonate  650 MG tablet Take 650 mg by mouth 2 (two) times daily.   vitamin B-12 (CYANOCOBALAMIN) 1000 MCG tablet Take 1,000 mcg by mouth daily.   SPIRIVA  RESPIMAT 2.5 MCG/ACT AERS INHALE 2 PUFFS BY MOUTH INTO THE LUNGS DAILY (Patient not taking: Reported on 07/25/2024)   No facility-administered encounter medications on file as of 07/25/2024.   REVIEW OF SYSTEMS:  Gen: Denies fever, sweats or chills. No weight loss.  CV: Denies chest pain, palpitations or edema. Resp: + Cough and SOB. GI: See HPI. GU: Denies urinary burning, blood in urine, increased urinary frequency or incontinence. MS: + Back pain. Derm: Denies rash, itchiness, skin lesions or unhealing ulcers. Psych: Denies depression, anxiety, memory loss or confusion. Heme: Denies bruising, easy bleeding. Neuro:  Denies headaches, dizziness or paresthesias. Endo:  Denies any problems with DM, thyroid  or adrenal function.  PHYSICAL EXAM: BP (!) 134/58   Pulse (!) 58  Ht 5' 9 (1.753 m)   Wt 179 lb (81.2 kg)   BMI 26.43 kg/m  General: 73 year old male in no acute distress. Head: Normocephalic and atraumatic. Eyes:  Sclerae non-icteric, conjunctive pink. Ears: Normal auditory acuity. Mouth: Dentition intact. No ulcers or lesions.  Neck: Supple, no lymphadenopathy or thyromegaly.  Lungs: Breath sounds diminished throughout without Eckels, wheezes or rhonchi. Heart: Regular rate and rhythm. No murmur, rub or gallop appreciated.  Abdomen: Soft, nontender, nondistended. No masses. No hepatosplenomegaly. Normoactive bowel sounds x 4 quadrants.  Rectal: Deferred. Musculoskeletal: Symmetrical with no gross deformities. Skin: Warm and dry. No rash or lesions on visible extremities. Extremities: LUE AV fistula with + bruit and thrill. No edema. Neurological: Alert  oriented x 4, no focal deficits.  Psychological: Alert and cooperative. Normal mood and affect.  ASSESSMENT AND PLAN:  73 year old male with recurrent dysphagia. Barium swallow study 11/2023 showed prominent cricopharyngeal muscle with brief lodging of the barium tablet at the GE junction. - Request copy of past EGD procedure and path report from [name] - EGD benefits and risks discussed including risk with sedation, risk of bleeding, perforation and infection.  Due to the patient's ESRD (not yet on HD), his EGD will likely need to be scheduled in the hospital setting, to further verify before scheduling. - Patient instructed to avoid eating large pieces of meat, bread or rice - CMP  Reported history of colon polyps.  Reported failed colonoscopy x 2 due 2 poor bowel prep per [name] within the past few years. Virtual colonoscopy study 01/2023 was nondiagnostic in the sigmoid colon due to severe diverticular disease and nondistention and everywhere else throughout the colon was unremarkable without obvious polyps or colon mass. No known family history of colorectal cancer. - Request copy of past colonoscopy procedure and path reports from [facility] - Colonoscopy deferred for now. Dr. Albertus to review past colonoscopy records once received as well as the patient's virtual colonoscopy.  Await Dr. Pamula recommendations regarding any future conventional colonoscopy/flexible sigmoidoscopy  ESRD, not yet on hemodialysis. S/P LUE AV fistula 2022 for anticipated future hemodialysis. - CMP - Patient to continue to follow-up with his nephrologist  Infrarenal aortic aneurysm -Continue follow-up with PCP  DM type II, steroid induced   COPD  ADDENDUM:   Colonoscopy per Dr. Elsie Cree 12/01/2022: Preparation of the colon was poor.   Diverticulosis in the sigmoid colon. Stool in the sigmoid colon and in the rectosigmoid colon. No specimens collected.   .   CC:  Rexanne Ingle, MD

## 2024-07-26 ENCOUNTER — Ambulatory Visit: Payer: Self-pay | Admitting: Nurse Practitioner

## 2024-07-26 NOTE — Addendum Note (Signed)
 Addended by: CARA ELIDA HERO on: 07/26/2024 09:01 AM   Modules accepted: Level of Service

## 2024-07-26 NOTE — Telephone Encounter (Signed)
 I called the patient as well as I did not see [name] response, patient got duplicate call which he appreciated.  Patient will contact his nephrologist today regarding current level of 5.15 and GFR 10.52.

## 2024-07-26 NOTE — Telephone Encounter (Signed)
 I have spoken with the patient to advise of worsening creatinine kidney function testing. We disussed that he should contact his primary care provider regarding this as well as his nephrologist, Dr Dolan. Patient says he will contact them today. Copy of recent labs has been faxed to both Dr Rexanne and Dr Dolan for their review.

## 2024-07-28 NOTE — Progress Notes (Signed)
 Addendum: Reviewed and agree with assessment and management plan. After records reviewed hospital outpatient upper endoscopy for diagnostic purposes plus possible dilation is reasonable with patient agreeable. Given incomplete colonoscopy and severe diverticulosis of the sigmoid if we were to pursue surveillance I would likely repeat virtual colonoscopy attempt or consider barium  enema [place], Gordy HERO, MD

## 2024-08-07 ENCOUNTER — Inpatient Hospital Stay (HOSPITAL_COMMUNITY): Admission: RE | Admit: 2024-08-07 | Source: Ambulatory Visit

## 2024-08-07 ENCOUNTER — Ambulatory Visit (HOSPITAL_COMMUNITY)
Admission: RE | Admit: 2024-08-07 | Discharge: 2024-08-07 | Disposition: A | Discharge status: Home / Self Care | Source: Ambulatory Visit | Attending: Nephrology | Admitting: Nephrology

## 2024-08-07 VITALS — BP 137/77 | HR 57 | Temp 97.0°F | Resp 16

## 2024-08-07 DIAGNOSIS — D631 Anemia in chronic kidney disease: Secondary | ICD-10-CM

## 2024-08-07 DIAGNOSIS — N185 Chronic kidney disease, stage 5: Secondary | ICD-10-CM | POA: Diagnosis not present

## 2024-08-07 LAB — POCT HEMOGLOBIN-HEMACUE: Hemoglobin: 11.6 g/dL — ABNORMAL LOW (ref 13.0–17.0)

## 2024-08-07 MED ORDER — EPOETIN ALFA-EPBX 10000 UNIT/ML IJ SOLN
INTRAMUSCULAR | Status: AC
  Filled 2024-08-07: qty 2

## 2024-08-07 MED ORDER — EPOETIN ALFA-EPBX 10000 UNIT/ML IJ SOLN
15000.0000 [IU] | Freq: Once | INTRAMUSCULAR | Status: AC
  Administered 2024-08-07: 15000 [IU] via SUBCUTANEOUS

## 2024-08-08 NOTE — Progress Notes (Signed)
 Linda/Dottie, Pls contact [name] as patient's EGD, colonoscopy procedure and path reports and virtually colonoscopy records previously requested have not been received.

## 2024-08-09 NOTE — Progress Notes (Signed)
 Rovanda, you were the medical assistant working with me day patient was seen. Please follow up on his EGD and colonoscopy procedure and path reports from [place] (we have his virtual colonoscopy record so do not need to request that). Keep me posted.

## 2024-08-12 DIAGNOSIS — M47816 Spondylosis without myelopathy or radiculopathy, lumbar region: Secondary | ICD-10-CM | POA: Diagnosis not present

## 2024-08-14 NOTE — Progress Notes (Signed)
 Taylor King, I reviewed the records placed to my office which included a virtual colonoscopy as well as a colonoscopy dated 12/01/2022.  EGD records were not included.  Patient endorsed having a EGD at [facility] in the recent past.  Can you please contact [name] and verify if he had an EGD with them or not and if he had the EGD please have them fax us  a copy of the record for my review.  Please let me know the outcome.  We were waiting on his EGD records before scheduling the EGD in the hospital setting.  Thank you so much.

## 2024-08-15 NOTE — Progress Notes (Signed)
 Rovanda, as we discussed 08/14/2024, you contacted the patient and he verified than he did not have an EGD with [name] which he initially thought he did per discussion during his office visit.   Please contact the patient and schedule him for an EGD with possible esophageal dilatation at [facility] with Dr. Albertus.   Please instruct the patient to contact our office if he has any changes in his health status prior to his EGD date.  I recommend scheduling the patient for an office visit 3 to 4 weeks after EGD completed to follow up on his UGI sx/dysphagia and to discuss if patient wishes to pursue a future virtual colonoscopy.   THX

## 2024-08-20 ENCOUNTER — Other Ambulatory Visit (HOSPITAL_COMMUNITY): Payer: Self-pay | Admitting: Nephrology

## 2024-08-21 ENCOUNTER — Ambulatory Visit (HOSPITAL_COMMUNITY)
Admission: RE | Admit: 2024-08-21 | Discharge: 2024-08-21 | Disposition: A | Discharge status: Home / Self Care | Source: Ambulatory Visit | Attending: Nephrology | Admitting: Nephrology

## 2024-08-21 VITALS — BP 143/76 | HR 58 | Temp 97.5°F | Resp 16

## 2024-08-21 DIAGNOSIS — N185 Chronic kidney disease, stage 5: Secondary | ICD-10-CM | POA: Diagnosis not present

## 2024-08-21 DIAGNOSIS — D631 Anemia in chronic kidney disease: Secondary | ICD-10-CM | POA: Diagnosis not present

## 2024-08-21 LAB — IRON AND TIBC
Iron: 94 ug/dL (ref 45–182)
Saturation Ratios: 30 % (ref 17.9–39.5)
TIBC: 312 ug/dL (ref 250–450)
UIBC: 218 ug/dL

## 2024-08-21 LAB — POCT HEMOGLOBIN-HEMACUE: Hemoglobin: 12 g/dL — ABNORMAL LOW (ref 13.0–17.0)

## 2024-08-21 LAB — FERRITIN: Ferritin: 463 ng/mL — ABNORMAL HIGH (ref 24–336)

## 2024-08-21 MED ORDER — EPOETIN ALFA-EPBX 3000 UNIT/ML IJ SOLN: 3000.0000 [IU] | Freq: Once | INTRAMUSCULAR | Status: DC

## 2024-08-21 MED ORDER — EPOETIN ALFA-EPBX 10000 UNIT/ML IJ SOLN: 10000.0000 [IU] | Freq: Once | INTRAMUSCULAR | Status: DC

## 2024-09-04 ENCOUNTER — Encounter (HOSPITAL_COMMUNITY)
Admission: RE | Admit: 2024-09-04 | Discharge: 2024-09-04 | Disposition: A | Discharge status: Home / Self Care | Source: Ambulatory Visit | Attending: Nephrology | Admitting: Nephrology

## 2024-09-04 VITALS — BP 143/76 | HR 57 | Temp 97.9°F | Resp 16

## 2024-09-04 DIAGNOSIS — N185 Chronic kidney disease, stage 5: Secondary | ICD-10-CM | POA: Insufficient documentation

## 2024-09-04 DIAGNOSIS — D631 Anemia in chronic kidney disease: Secondary | ICD-10-CM | POA: Diagnosis not present

## 2024-09-04 LAB — POCT HEMOGLOBIN-HEMACUE: Hemoglobin: 10.8 g/dL — ABNORMAL LOW (ref 13.0–17.0)

## 2024-09-04 MED ORDER — EPOETIN ALFA-EPBX 10000 UNIT/ML IJ SOLN
INTRAMUSCULAR | Status: AC
  Filled 2024-09-04: qty 1

## 2024-09-04 MED ORDER — EPOETIN ALFA-EPBX 3000 UNIT/ML IJ SOLN
INTRAMUSCULAR | Status: AC
  Filled 2024-09-04: qty 1

## 2024-09-04 MED ORDER — EPOETIN ALFA-EPBX 10000 UNIT/ML IJ SOLN
10000.0000 [IU] | Freq: Once | INTRAMUSCULAR | Status: AC
  Administered 2024-09-04: 10000 [IU] via SUBCUTANEOUS

## 2024-09-04 MED ORDER — EPOETIN ALFA-EPBX 3000 UNIT/ML IJ SOLN
3000.0000 [IU] | Freq: Once | INTRAMUSCULAR | Status: AC
  Administered 2024-09-04: 3000 [IU] via SUBCUTANEOUS

## 2024-09-05 DIAGNOSIS — M1711 Unilateral primary osteoarthritis, right knee: Secondary | ICD-10-CM | POA: Diagnosis not present

## 2024-09-12 ENCOUNTER — Other Ambulatory Visit: Payer: Self-pay | Admitting: Orthopedic Surgery

## 2024-09-12 DIAGNOSIS — M1711 Unilateral primary osteoarthritis, right knee: Secondary | ICD-10-CM

## 2024-09-18 ENCOUNTER — Ambulatory Visit (HOSPITAL_COMMUNITY)
Admission: RE | Admit: 2024-09-18 | Discharge: 2024-09-18 | Disposition: A | Discharge status: Home / Self Care | Source: Ambulatory Visit | Attending: Nephrology | Admitting: Nephrology

## 2024-09-18 VITALS — BP 153/76 | HR 53 | Temp 97.0°F | Resp 16

## 2024-09-18 DIAGNOSIS — D631 Anemia in chronic kidney disease: Secondary | ICD-10-CM | POA: Diagnosis not present

## 2024-09-18 DIAGNOSIS — N185 Chronic kidney disease, stage 5: Secondary | ICD-10-CM | POA: Diagnosis not present

## 2024-09-18 LAB — IRON AND TIBC
Iron: 76 ug/dL (ref 45–182)
Saturation Ratios: 26 % (ref 17.9–39.5)
TIBC: 294 ug/dL (ref 250–450)
UIBC: 218 ug/dL

## 2024-09-18 LAB — POCT HEMOGLOBIN-HEMACUE: Hemoglobin: 11.1 g/dL — ABNORMAL LOW (ref 13.0–17.0)

## 2024-09-18 LAB — FERRITIN: Ferritin: 461 ng/mL — ABNORMAL HIGH (ref 24–336)

## 2024-09-18 MED ORDER — EPOETIN ALFA-EPBX 10000 UNIT/ML IJ SOLN
INTRAMUSCULAR | Status: AC
  Filled 2024-09-18: qty 1

## 2024-09-18 MED ORDER — EPOETIN ALFA-EPBX 3000 UNIT/ML IJ SOLN
3000.0000 [IU] | Freq: Once | INTRAMUSCULAR | Status: AC
  Administered 2024-09-18: 3000 [IU] via SUBCUTANEOUS

## 2024-09-18 MED ORDER — EPOETIN ALFA-EPBX 10000 UNIT/ML IJ SOLN
10000.0000 [IU] | Freq: Once | INTRAMUSCULAR | Status: AC
  Administered 2024-09-18: 10000 [IU] via SUBCUTANEOUS

## 2024-09-18 MED ORDER — EPOETIN ALFA-EPBX 3000 UNIT/ML IJ SOLN
INTRAMUSCULAR | Status: AC
  Filled 2024-09-18: qty 1

## 2024-09-24 ENCOUNTER — Other Ambulatory Visit: Payer: Self-pay | Admitting: Interventional Radiology

## 2024-09-24 DIAGNOSIS — M1711 Unilateral primary osteoarthritis, right knee: Secondary | ICD-10-CM

## 2024-09-24 DIAGNOSIS — G8929 Other chronic pain: Secondary | ICD-10-CM | POA: Diagnosis not present

## 2024-09-24 DIAGNOSIS — M545 Low back pain, unspecified: Secondary | ICD-10-CM | POA: Diagnosis not present

## 2024-09-25 DIAGNOSIS — N281 Cyst of kidney, acquired: Secondary | ICD-10-CM | POA: Diagnosis not present

## 2024-09-25 DIAGNOSIS — N200 Calculus of kidney: Secondary | ICD-10-CM | POA: Diagnosis not present

## 2024-10-02 ENCOUNTER — Inpatient Hospital Stay (HOSPITAL_COMMUNITY)
Admission: RE | Admit: 2024-10-02 | Discharge: 2024-10-02 | Disposition: A | Source: Ambulatory Visit | Attending: Nephrology

## 2024-10-02 VITALS — BP 135/85 | HR 61 | Temp 97.6°F | Resp 16

## 2024-10-02 DIAGNOSIS — N185 Chronic kidney disease, stage 5: Secondary | ICD-10-CM | POA: Diagnosis not present

## 2024-10-02 DIAGNOSIS — D631 Anemia in chronic kidney disease: Secondary | ICD-10-CM

## 2024-10-02 LAB — POCT HEMOGLOBIN-HEMACUE: Hemoglobin: 11.6 g/dL — ABNORMAL LOW (ref 13.0–17.0)

## 2024-10-02 MED ORDER — EPOETIN ALFA-EPBX 3000 UNIT/ML IJ SOLN
3000.0000 [IU] | Freq: Once | INTRAMUSCULAR | Status: AC
  Administered 2024-10-02: 3000 [IU] via SUBCUTANEOUS

## 2024-10-02 MED ORDER — EPOETIN ALFA-EPBX 3000 UNIT/ML IJ SOLN
INTRAMUSCULAR | Status: AC
  Filled 2024-10-02: qty 1

## 2024-10-02 MED ORDER — EPOETIN ALFA-EPBX 10000 UNIT/ML IJ SOLN
INTRAMUSCULAR | Status: AC
  Filled 2024-10-02: qty 1

## 2024-10-02 MED ORDER — EPOETIN ALFA-EPBX 10000 UNIT/ML IJ SOLN
10000.0000 [IU] | Freq: Once | INTRAMUSCULAR | Status: AC
  Administered 2024-10-02: 10000 [IU] via SUBCUTANEOUS

## 2024-10-04 NOTE — Progress Notes (Signed)
 Chief Complaint: Patient was seen in consultation today for right knee pain.   Referring Physician(s): Marchwiany,Daniel A  History of Present Illness: Taylor King is a 73 y.o. male with a history of emphysema, CKD stage V, HTN, AAA, and DM2. He also has a history of bilateral knee osteoarthritis and is s/p left knee replacement 2013. He has developed worsening right knee pain and is reluctant to consider knee replacement again. He has received several steroid injections into the right knee and while these provide satisfactory relief the effects are short lived - only 2 or 3 weeks.    Dr. Edna discussed alternative treatment options including viscosupplement injections and geniculate artery embolization. The patient would like to consider geniculate artery embolization and the patient has been kindly referred to Interventional Radiology. He presents today for further discussion.   [place] Pain Score =  VAS Pain Score =    Past Medical History:  Diagnosis Date   AKI (acute kidney injury)    Arthritis    Chronic back pain    Chronic kidney disease    Chronic kidney disease, stage 5, kidney failure (HCC)    COPD (chronic obstructive pulmonary disease) (HCC)    Diabetes mellitus without complication (HCC)    no meds currently   Diverticulitis    Dyspnea    with exertion   GERD (gastroesophageal reflux disease)    occ -no meds, diet controlled   History of blood transfusion    History of kidney stones    passed stones - no surgery   Iron deficiency    blood transfusions in 09/2020 and 11/2020 - currently getting iron fusions EOW   Peripheral vascular disease    aaa 39 mm 02-14-17 epic us     Past Surgical History:  Procedure Laterality Date   adenoids removed   age 26   ANTERIOR LAT LUMBAR FUSION N/A 03/26/2020   Procedure: ANTERIOR LATERAL LUMBAR FUSION (XLIF) LUMBAR TWO-THREE WITH POSTERIOR SPINAL FUSION INTERBODY LUMBAR TWO-THREE;  Surgeon: Burnetta Aures, MD;   Location: [facility] OR;  Service: Orthopedics;  Laterality: N/A;  4 hrs Left tap block with exparel    AV FISTULA PLACEMENT Left 05/10/2021   Procedure: LEFT BRACHIOCEPHALIC ARTERIOVENOUS (AV) FISTULA CREATION;  Surgeon: Harvey Carlin BRAVO, MD;  Location: [facility] OR;  Service: Vascular;  Laterality: Left;   COLONOSCOPY WITH PROPOFOL  N/A 03/07/2017   Procedure: COLONOSCOPY WITH PROPOFOL ;  Surgeon: Gladis MARLA Louder, MD;  Location: [facility] END[facility];  Service: Endoscopy;  Laterality: N/A;   colonscopy     x 3   KNEE ARTHROSCOPY  [id number]   left knee   TONSILLECTOMY  age 61   TOTAL KNEE ARTHROPLASTY  11/25/2011   Procedure: TOTAL KNEE ARTHROPLASTY;  Surgeon: Jerona LULLA Sage, MD;  Location: [facility] OR;  Service: Orthopedics;  Laterality: Left;  Left Total Knee Arthroplasty    Allergies: Patient has no known allergies.  Medications: Prior to Admission medications   Medication Sig Start Date End Date Taking? Authorizing Provider  amLODipine (NORVASC) 5 MG tablet Take 5 mg by mouth daily.    [provider]  atenolol  (TENORMIN ) 50 MG tablet Take 50 mg by mouth daily. 10/07/20   [provider]  Cholecalciferol  (VITAMIN D ) 50 MCG (2000 UT) CAPS Take 2,000 Units by mouth daily.    [provider]  epoetin  alfa-epbx (RETACRIT ) 2000 UNIT/ML injection See admin instructions. Patient taking differently: 2,000 Units every 14 (fourteen) days.    [provider]  sodium bicarbonate  650 MG tablet  Take 650 mg by mouth 2 (two) times daily. 04/06/23   [provider]  SPIRIVA  RESPIMAT 2.5 MCG/ACT AERS INHALE 2 PUFFS BY MOUTH INTO THE LUNGS DAILY Patient not taking: Reported on 07/25/2024 09/20/21   Kara Dorn NOVAK, MD  vitamin B-12 (CYANOCOBALAMIN) 1000 MCG tablet Take 1,000 mcg by mouth daily.    [provider]     Family History  Problem Relation Age of Onset   Emphysema Father    Colon cancer Neg Hx    Esophageal cancer Neg Hx     Social History   Socioeconomic History    Marital status: Married    Spouse name: Not on file   Number of children: Not on file   Years of education: Not on file   Highest education level: Not on file  Occupational History   Not on file  Tobacco Use   Smoking status: Former    Current packs/day: 0.00    Average packs/day: 0.3 packs/day for 30.0 years (7.5 ttl pk-yrs)    Types: Cigarettes    Start date: 12/26/1983    Quit date: 12/25/2013    Years since quitting: 10.7   Smokeless tobacco: Never  Vaping Use   Vaping status: Never Used  Substance and Sexual Activity   Alcohol use: No   Drug use: No   Sexual activity: Not on file  Other Topics Concern   Not on file  Social History Narrative   Not on file   Social Drivers of Health   Financial Resource Strain: Not on file  Food Insecurity: Not on file  Transportation Needs: Not on file  Physical Activity: Not on file  Stress: Not on file  Social Connections: Unknown (03/27/2022)   Received from [facility]   Social Network    Social Network: Not on file    Review of Systems: A 12 point ROS discussed and pertinent positives are indicated in the HPI above.  All other systems are negative.  Review of Systems  Vital Signs: There were no vitals taken for this visit.  Advance Care Plan: The advanced care plan/surrogate decision maker was discussed at the time of visit and documented in the medical record.    Physical Exam  Imaging: No results found.  Labs:  CBC: Recent Labs    08/21/24 1401 09/04/24 1341 09/18/24 1348 10/02/24 1343  HGB 12.0* 10.8* 11.1* 11.6*    COAGS: No results for input(s): INR, APTT in the last 8760 hours.  BMP: Recent Labs    07/25/24 1535  NA 143  K 5.0  CL 109  CO2 24  GLUCOSE 98  BUN 59*  CALCIUM 8.9  CREATININE 5.15*    LIVER FUNCTION TESTS: Recent Labs    07/25/24 1535  BILITOT 0.6  AST 5  ALT 7  ALKPHOS 71  PROT 6.9  ALBUMIN 4.1    TUMOR MARKERS: No results for input(s): AFPTM, CEA,  CA199, CHROMGRNA in the last 8760 hours.  Assessment and Plan:  73 year old male with a history of osteoarthritis with worsening right knee pain.    Thank you for this interesting consult.  I greatly enjoyed meeting Taylor King and look forward to participating in their care.  A copy of this report was sent to the requesting provider on this date.  Electronically Signed: Warren JONELLE Dais, NP 10/04/2024, 1:25 PM   I spent a total of  30 Minutes   in face to face in clinical consultation, greater than 50% of which was  counseling/coordinating care for right knee pain.

## 2024-10-07 ENCOUNTER — Ambulatory Visit
Admission: RE | Admit: 2024-10-07 | Discharge: 2024-10-07 | Disposition: A | Discharge status: Home / Self Care | Source: Ambulatory Visit | Attending: Orthopedic Surgery | Admitting: Orthopedic Surgery

## 2024-10-07 ENCOUNTER — Ambulatory Visit
Admission: RE | Admit: 2024-10-07 | Discharge: 2024-10-07 | Disposition: A | Source: Ambulatory Visit | Attending: Interventional Radiology

## 2024-10-07 DIAGNOSIS — N185 Chronic kidney disease, stage 5: Secondary | ICD-10-CM | POA: Diagnosis not present

## 2024-10-07 DIAGNOSIS — M1711 Unilateral primary osteoarthritis, right knee: Secondary | ICD-10-CM

## 2024-10-07 HISTORY — PX: IR RADIOLOGIST EVAL & MGMT: IMG5224

## 2024-10-08 ENCOUNTER — Other Ambulatory Visit: Payer: Self-pay | Admitting: Interventional Radiology

## 2024-10-08 DIAGNOSIS — I739 Peripheral vascular disease, unspecified: Secondary | ICD-10-CM

## 2024-10-16 ENCOUNTER — Inpatient Hospital Stay (HOSPITAL_COMMUNITY)
Admission: RE | Admit: 2024-10-16 | Discharge: 2024-10-16 | Disposition: A | Source: Ambulatory Visit | Attending: Nephrology

## 2024-10-16 VITALS — BP 156/78 | HR 56 | Temp 97.8°F | Resp 18

## 2024-10-16 DIAGNOSIS — N185 Chronic kidney disease, stage 5: Secondary | ICD-10-CM | POA: Diagnosis present

## 2024-10-16 DIAGNOSIS — D631 Anemia in chronic kidney disease: Secondary | ICD-10-CM | POA: Insufficient documentation

## 2024-10-16 LAB — IRON AND TIBC
Iron: 89 ug/dL (ref 45–182)
Saturation Ratios: 29 % (ref 17.9–39.5)
TIBC: 305 ug/dL (ref 250–450)
UIBC: 216 ug/dL

## 2024-10-16 LAB — POCT HEMOGLOBIN-HEMACUE: Hemoglobin: 10.7 g/dL — ABNORMAL LOW (ref 13.0–17.0)

## 2024-10-16 LAB — FERRITIN: Ferritin: 567 ng/mL — ABNORMAL HIGH (ref 24–336)

## 2024-10-16 MED ORDER — EPOETIN ALFA-EPBX 10000 UNIT/ML IJ SOLN
INTRAMUSCULAR | Status: AC
  Filled 2024-10-16: qty 1

## 2024-10-16 MED ORDER — EPOETIN ALFA-EPBX 3000 UNIT/ML IJ SOLN
INTRAMUSCULAR | Status: AC
  Filled 2024-10-16: qty 1

## 2024-10-16 MED ORDER — EPOETIN ALFA-EPBX 3000 UNIT/ML IJ SOLN
3000.0000 [IU] | Freq: Once | INTRAMUSCULAR | Status: AC
  Administered 2024-10-16: 3000 [IU] via SUBCUTANEOUS

## 2024-10-16 MED ORDER — EPOETIN ALFA-EPBX 10000 UNIT/ML IJ SOLN
10000.0000 [IU] | Freq: Once | INTRAMUSCULAR | Status: AC
  Administered 2024-10-16: 10000 [IU] via SUBCUTANEOUS

## 2024-10-17 DIAGNOSIS — N057 Unspecified nephritic syndrome with diffuse crescentic glomerulonephritis: Secondary | ICD-10-CM | POA: Diagnosis not present

## 2024-10-17 DIAGNOSIS — I770 Arteriovenous fistula, acquired: Secondary | ICD-10-CM | POA: Diagnosis not present

## 2024-10-17 DIAGNOSIS — N185 Chronic kidney disease, stage 5: Secondary | ICD-10-CM | POA: Diagnosis not present

## 2024-10-17 DIAGNOSIS — E872 Acidosis, unspecified: Secondary | ICD-10-CM | POA: Diagnosis not present

## 2024-10-17 DIAGNOSIS — N2581 Secondary hyperparathyroidism of renal origin: Secondary | ICD-10-CM | POA: Diagnosis not present

## 2024-10-17 DIAGNOSIS — D631 Anemia in chronic kidney disease: Secondary | ICD-10-CM | POA: Diagnosis not present

## 2024-10-17 DIAGNOSIS — N058 Unspecified nephritic syndrome with other morphologic changes: Secondary | ICD-10-CM | POA: Diagnosis not present

## 2024-10-17 DIAGNOSIS — I10 Essential (primary) hypertension: Secondary | ICD-10-CM | POA: Diagnosis not present

## 2024-10-17 DIAGNOSIS — E875 Hyperkalemia: Secondary | ICD-10-CM | POA: Diagnosis not present

## 2024-10-23 ENCOUNTER — Ambulatory Visit
Admission: RE | Admit: 2024-10-23 | Discharge: 2024-10-23 | Disposition: A | Discharge status: Home / Self Care | Source: Ambulatory Visit | Attending: Interventional Radiology | Admitting: Interventional Radiology

## 2024-10-23 DIAGNOSIS — I739 Peripheral vascular disease, unspecified: Secondary | ICD-10-CM

## 2024-10-25 NOTE — Progress Notes (Signed)
 Referring Physician(s): Marchwiany,Daniel A   Chief Complaint: The patient is seen in consultation today for right knee pain and peripheral artery disease   History of present illness: HPI from initial consultation 10/07/24 Taylor King is a 73 y.o. male with a history of emphysema, CKD stage V, HTN, AAA, and DM2. He also has a history of bilateral knee osteoarthritis and is s/p left knee replacement 2013. He developed worsening right knee pain and is reluctant to consider knee replacement again. He has received several steroid injections into the right knee and while these provide satisfactory relief the effects are short lived - only 2 or 3 weeks.    Dr. Edna discussed alternative treatment options including viscosupplement injections and geniculate artery embolization. The patient would like to consider geniculate artery embolization and the patient has been kindly referred to Interventional Radiology. He presents today for further discussion.    His pain is most severe going up and down stairs.  He takes tylenol  occasionally with minimal relief.  He denies any claudication symptoms however his ambulation is limited by his back and possibly his knee pain.  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  He still makes urine.  He follows with Dr. Dolan with [facility].  He has a known stable abdominal aortic aneurysm which is being monitored by Dr. Magda.    Womac Pain Score = 48/96 VAS Pain Score = 6/10  I explained that his stage 5 chronic kidney disease and extensive atherosclerosis in the right lower extremity seen on right knee X-ray were complicating factors. His most recent GFR of 10 is likely too low to tolerate the iodinated contrast required for GAE. I discussed reaching out to Dr. Dolan for his recommendations and ABIs were ordered to further assess the degree of calcification/peripheral artery  disease.   His imaging study was completed 10/23/24 and he presents today to review these results.    Past Medical History:  Diagnosis Date   AKI (acute kidney injury)    Arthritis    Chronic back pain    Chronic kidney disease    Chronic kidney disease, stage 5, kidney failure (HCC)    COPD (chronic obstructive pulmonary disease) (HCC)    Diabetes mellitus without complication (HCC)    no meds currently   Diverticulitis    Dyspnea    with exertion   GERD (gastroesophageal reflux disease)    occ -no meds, diet controlled   History of blood transfusion    History of kidney stones    passed stones - no surgery   Iron deficiency    blood transfusions in 09/2020 and 11/2020 - currently getting iron fusions EOW   Peripheral vascular disease    aaa 39 mm 02-14-17 epic us     Past Surgical History:  Procedure Laterality Date   adenoids removed   age 83   ANTERIOR LAT LUMBAR FUSION N/A 03/26/2020   Procedure: ANTERIOR LATERAL LUMBAR FUSION (XLIF) LUMBAR TWO-THREE WITH POSTERIOR SPINAL FUSION INTERBODY LUMBAR TWO-THREE;  Surgeon: Burnetta Aures, MD;  Location: [facility] OR;  Service: Orthopedics;  Laterality: N/A;  4 hrs Left tap block with exparel    AV FISTULA PLACEMENT Left 05/10/2021   Procedure: LEFT BRACHIOCEPHALIC ARTERIOVENOUS (AV) FISTULA CREATION;  Surgeon: Harvey Carlin BRAVO, MD;  Location: [facility] OR;  Service: Vascular;  Laterality: Left;   COLONOSCOPY WITH PROPOFOL  N/A 03/07/2017   Procedure: COLONOSCOPY WITH PROPOFOL ;  Surgeon: Gladis MARLA Louder, MD;  Location: [facility];  Service: Endoscopy;  Laterality: N/A;   colonscopy     x 3   IR RADIOLOGIST EVAL & MGMT  10/07/2024   KNEE ARTHROSCOPY  [id number]   left knee   TONSILLECTOMY  age 59   TOTAL KNEE ARTHROPLASTY  11/25/2011   Procedure: TOTAL KNEE ARTHROPLASTY;  Surgeon: Jerona LULLA Sage, MD;  Location: [facility] OR;  Service: Orthopedics;  Laterality: Left;  Left Total Knee Arthroplasty    Allergies: Patient has no known  allergies.  Medications: Prior to Admission medications  Medication Sig Start Date End Date Taking? Authorizing Provider  amLODipine (NORVASC) 5 MG tablet Take 5 mg by mouth daily.    [provider]  atenolol  (TENORMIN ) 50 MG tablet Take 50 mg by mouth daily. 10/07/20   [provider]  Cholecalciferol  (VITAMIN D ) 50 MCG (2000 UT) CAPS Take 2,000 Units by mouth daily.    [provider]  epoetin  alfa-epbx (RETACRIT ) 2000 UNIT/ML injection See admin instructions. Patient taking differently: 2,000 Units every 14 (fourteen) days.    [provider]  sodium bicarbonate  650 MG tablet Take 650 mg by mouth 2 (two) times daily. 04/06/23   [provider]  SPIRIVA  RESPIMAT 2.5 MCG/ACT AERS INHALE 2 PUFFS BY MOUTH INTO THE LUNGS DAILY Patient not taking: Reported on 07/25/2024 09/20/21   Kara Dorn NOVAK, MD  vitamin B-12 (CYANOCOBALAMIN) 1000 MCG tablet Take 1,000 mcg by mouth daily.    [provider]     Family History  Problem Relation Age of Onset   Emphysema Father    Colon cancer Neg Hx    Esophageal cancer Neg Hx     Social History   Socioeconomic History   Marital status: Married    Spouse name: Not on file   Number of children: Not on file   Years of education: Not on file   Highest education level: Not on file  Occupational History   Not on file  Tobacco Use   Smoking status: Former    Current packs/day: 0.00    Average packs/day: 0.3 packs/day for 30.0 years (7.5 ttl pk-yrs)    Types: Cigarettes    Start date: 12/26/1983    Quit date: 12/25/2013    Years since quitting: 10.8   Smokeless tobacco: Never  Vaping Use   Vaping status: Never Used  Substance and Sexual Activity   Alcohol use: No   Drug use: No   Sexual activity: Not on file  Other Topics Concern   Not on file  Social History Narrative   Not on file   Social Drivers of Health   Tobacco Use: Medium Risk (07/25/2024)   Patient History    Smoking  Tobacco Use: Former    Smokeless Tobacco Use: Never    Passive Exposure: Not on Actuary Strain: Not on file  Food Insecurity: Not on file  Transportation Needs: Not on file  Physical Activity: Not on file  Stress: Not on file  Social Connections: Unknown (03/27/2022)   Received [facility]   Social Network    Social Network: Not on file  Depression (PHQ2-9): Not on file  Alcohol Screen: Not on file  Housing: Not on file  Utilities: Not on file  Health Literacy: Not on file     Vital Signs: There were no vitals taken for this visit.  Physical Exam  Imaging:  R Knee 10/07/24   Kellgren and Lawrence grade II Atherosclerosis   ABIs 10/23/24 IMPRESSION: 1. Segmental  noninvasive findings are indeterminate. 2. Ankle brachial indices cannot be calculated due to poor compressibility of the vessels at the ankle. However, the bilateral toe brachial indices are normal to near normal. 3. Arterial waveforms appear diminished and monophasic which is abnormal, however the pulse volume recordings appear near normal. Overall, at least infrageniculate disease is suspected bilaterally.  Labs:  CBC: Recent Labs    09/04/24 1341 09/18/24 1348 10/02/24 1343 10/16/24 1355  HGB 10.8* 11.1* 11.6* 10.7*    COAGS: No results for input(s): INR, APTT in the last 8760 hours.  BMP: Recent Labs    07/25/24 1535  NA 143  K 5.0  CL 109  CO2 24  GLUCOSE 98  BUN 59*  CALCIUM 8.9  CREATININE 5.15*    LIVER FUNCTION TESTS: Recent Labs    07/25/24 1535  BILITOT 0.6  AST 5  ALT 7  ALKPHOS 71  PROT 6.9  ALBUMIN 4.1    Assessment and Plan:  73 year old male with a history of advanced right knee osteoarthritis (K&L 2) with worsening right knee pain (WOMAC 48/96) refractory to conservative measures.  Complicating features in his case are stage 5 chronic kidney disease and extensive atherosclerosis in the right lower extremity radiographically.   His most recent GFR was 10.  This is likely too low to tolerate iodinated contrast required for GAE. His nephrologist was consulted for guidance and ABIs were completed 10/23/24.   Ester Sides, MD Pager: [phone]    I spent a total of 40 Minutes in face to face in clinical consultation, greater than 50% of which was counseling/coordinating care for right knee pain and PAD

## 2024-10-28 ENCOUNTER — Inpatient Hospital Stay
Admission: RE | Admit: 2024-10-28 | Discharge: 2024-10-28 | Disposition: A | Source: Ambulatory Visit | Attending: Interventional Radiology

## 2024-10-28 DIAGNOSIS — I739 Peripheral vascular disease, unspecified: Secondary | ICD-10-CM

## 2024-10-28 HISTORY — PX: IR RADIOLOGIST EVAL & MGMT: IMG5224

## 2024-10-30 ENCOUNTER — Ambulatory Visit (HOSPITAL_COMMUNITY)

## 2024-10-30 VITALS — BP 134/72 | HR 56 | Temp 97.1°F | Resp 16

## 2024-10-30 DIAGNOSIS — N185 Chronic kidney disease, stage 5: Secondary | ICD-10-CM | POA: Diagnosis not present

## 2024-10-30 DIAGNOSIS — D631 Anemia in chronic kidney disease: Secondary | ICD-10-CM

## 2024-10-30 LAB — POCT HEMOGLOBIN-HEMACUE: Hemoglobin: 11.1 g/dL — ABNORMAL LOW (ref 13.0–17.0)

## 2024-10-30 MED ORDER — EPOETIN ALFA-EPBX 10000 UNIT/ML IJ SOLN
10000.0000 [IU] | Freq: Once | INTRAMUSCULAR | Status: AC
  Administered 2024-10-30: 14:00:00 10000 [IU] via SUBCUTANEOUS

## 2024-10-30 MED ORDER — EPOETIN ALFA-EPBX 3000 UNIT/ML IJ SOLN
3000.0000 [IU] | Freq: Once | INTRAMUSCULAR | Status: AC
  Administered 2024-10-30: 14:00:00 3000 [IU] via SUBCUTANEOUS

## 2024-10-30 MED FILL — Epoetin Alfa-epbx Inj 10000 Unit/ML: INTRAMUSCULAR | Qty: 1 | Status: AC

## 2024-10-30 MED FILL — Epoetin Alfa-epbx Inj 3000 Unit/ML: INTRAMUSCULAR | Qty: 1 | Status: AC

## 2024-11-13 ENCOUNTER — Ambulatory Visit (HOSPITAL_COMMUNITY)
Admission: RE | Admit: 2024-11-13 | Discharge: 2024-11-13 | Disposition: A | Discharge status: Home / Self Care | Source: Ambulatory Visit | Attending: Nephrology | Admitting: Nephrology

## 2024-11-13 VITALS — BP 152/71 | HR 56 | Temp 97.3°F | Resp 16

## 2024-11-13 DIAGNOSIS — D631 Anemia in chronic kidney disease: Secondary | ICD-10-CM

## 2024-11-13 DIAGNOSIS — N185 Chronic kidney disease, stage 5: Secondary | ICD-10-CM | POA: Diagnosis not present

## 2024-11-13 MED ORDER — EPOETIN ALFA-EPBX 3000 UNIT/ML IJ SOLN
3000.0000 [IU] | Freq: Once | INTRAMUSCULAR | Status: AC
  Administered 2024-11-13: 3000 [IU] via SUBCUTANEOUS

## 2024-11-13 MED ORDER — EPOETIN ALFA-EPBX 3000 UNIT/ML IJ SOLN
INTRAMUSCULAR | Status: AC
  Filled 2024-11-13: qty 1

## 2024-11-13 MED ORDER — EPOETIN ALFA-EPBX 10000 UNIT/ML IJ SOLN
10000.0000 [IU] | Freq: Once | INTRAMUSCULAR | Status: AC
  Administered 2024-11-13: 10000 [IU] via SUBCUTANEOUS

## 2024-11-13 MED ORDER — EPOETIN ALFA-EPBX 10000 UNIT/ML IJ SOLN
INTRAMUSCULAR | Status: AC
  Filled 2024-11-13: qty 2

## 2024-11-15 LAB — POCT HEMOGLOBIN-HEMACUE: Hemoglobin: 10.7 g/dL — ABNORMAL LOW (ref 13.0–17.0)

## 2024-11-21 ENCOUNTER — Telehealth (HOSPITAL_COMMUNITY): Payer: Self-pay

## 2024-11-21 NOTE — Telephone Encounter (Signed)
 Auth Submission: NO AUTH NEEDED Site of care: Site of care: [facility] Payer: [employer] Medication & CPT/J Code(s) submitted: Retacrit  ([id number]) Diagnosis Code: N18.5, D63.1 Route of submission (phone, fax, portal):  Phone # Fax # Auth type: Buy/Bill HB Units/visits requested: 13000 units q2weeks Reference number:  Approval from: 11/14/24 to 11/13/25

## 2024-11-27 ENCOUNTER — Ambulatory Visit (HOSPITAL_COMMUNITY)
Admission: RE | Admit: 2024-11-27 | Discharge: 2024-11-27 | Disposition: A | Discharge status: Home / Self Care | Source: Ambulatory Visit | Attending: Nephrology | Admitting: Nephrology

## 2024-11-27 VITALS — BP 147/74 | HR 48 | Temp 97.2°F | Resp 16

## 2024-11-27 DIAGNOSIS — D631 Anemia in chronic kidney disease: Secondary | ICD-10-CM | POA: Diagnosis present

## 2024-11-27 DIAGNOSIS — N185 Chronic kidney disease, stage 5: Secondary | ICD-10-CM | POA: Diagnosis present

## 2024-11-27 LAB — POCT HEMOGLOBIN-HEMACUE: Hemoglobin: 10.5 g/dL — ABNORMAL LOW (ref 13.0–17.0)

## 2024-11-27 MED ORDER — EPOETIN ALFA-EPBX 10000 UNIT/ML IJ SOLN
INTRAMUSCULAR | Status: AC
  Filled 2024-11-27: qty 1

## 2024-11-27 MED ORDER — EPOETIN ALFA-EPBX 3000 UNIT/ML IJ SOLN
INTRAMUSCULAR | Status: AC
  Filled 2024-11-27: qty 1

## 2024-11-27 MED ORDER — EPOETIN ALFA-EPBX 10000 UNIT/ML IJ SOLN
10000.0000 [IU] | Freq: Once | INTRAMUSCULAR | Status: AC
  Administered 2024-11-27: 10000 [IU] via SUBCUTANEOUS

## 2024-11-27 MED ORDER — EPOETIN ALFA-EPBX 3000 UNIT/ML IJ SOLN
3000.0000 [IU] | Freq: Once | INTRAMUSCULAR | Status: AC
  Administered 2024-11-27: 3000 [IU] via SUBCUTANEOUS

## 2024-12-11 ENCOUNTER — Ambulatory Visit (HOSPITAL_COMMUNITY)
Admission: RE | Admit: 2024-12-11 | Discharge: 2024-12-11 | Disposition: A | Source: Ambulatory Visit | Attending: Nephrology

## 2024-12-11 VITALS — BP 141/67 | HR 51 | Temp 97.7°F | Resp 16

## 2024-12-11 DIAGNOSIS — N185 Chronic kidney disease, stage 5: Secondary | ICD-10-CM | POA: Diagnosis not present

## 2024-12-11 DIAGNOSIS — D631 Anemia in chronic kidney disease: Secondary | ICD-10-CM

## 2024-12-11 LAB — POCT HEMOGLOBIN-HEMACUE: Hemoglobin: 10.6 g/dL — ABNORMAL LOW (ref 13.0–17.0)

## 2024-12-11 MED ORDER — EPOETIN ALFA-EPBX 10000 UNIT/ML IJ SOLN
10000.0000 [IU] | Freq: Once | INTRAMUSCULAR | Status: AC
  Administered 2024-12-11: 10000 [IU] via SUBCUTANEOUS

## 2024-12-11 MED ORDER — EPOETIN ALFA-EPBX 3000 UNIT/ML IJ SOLN
3000.0000 [IU] | Freq: Once | INTRAMUSCULAR | Status: AC
  Administered 2024-12-11: 3000 [IU] via SUBCUTANEOUS

## 2024-12-11 MED ORDER — EPOETIN ALFA-EPBX 3000 UNIT/ML IJ SOLN
INTRAMUSCULAR | Status: AC
  Filled 2024-12-11: qty 1

## 2024-12-11 MED ORDER — EPOETIN ALFA-EPBX 10000 UNIT/ML IJ SOLN
INTRAMUSCULAR | Status: AC
  Filled 2024-12-11: qty 1

## 2024-12-25 ENCOUNTER — Encounter (HOSPITAL_COMMUNITY)

## 2025-01-08 ENCOUNTER — Encounter (HOSPITAL_COMMUNITY)
